# Patient Record
Sex: Female | Born: 1940
Health system: Southern US, Community
[De-identification: ages and names within clinical notes are randomized; demographics above are authoritative.]

## PROBLEM LIST (undated history)

## (undated) DIAGNOSIS — N2889 Other specified disorders of kidney and ureter: Secondary | ICD-10-CM

## (undated) DIAGNOSIS — I10 Essential (primary) hypertension: Secondary | ICD-10-CM

## (undated) DIAGNOSIS — E890 Postprocedural hypothyroidism: Secondary | ICD-10-CM

## (undated) DIAGNOSIS — M1990 Unspecified osteoarthritis, unspecified site: Secondary | ICD-10-CM

## (undated) DIAGNOSIS — E079 Disorder of thyroid, unspecified: Secondary | ICD-10-CM

## (undated) DIAGNOSIS — D249 Benign neoplasm of unspecified breast: Secondary | ICD-10-CM

## (undated) DIAGNOSIS — E785 Hyperlipidemia, unspecified: Secondary | ICD-10-CM

## (undated) DIAGNOSIS — N189 Chronic kidney disease, unspecified: Secondary | ICD-10-CM

## (undated) HISTORY — DX: Essential (primary) hypertension: I10

## (undated) HISTORY — DX: Benign neoplasm of unspecified breast: D24.9

## (undated) HISTORY — DX: Hyperlipidemia, unspecified: E78.5

## (undated) HISTORY — DX: Postprocedural hypothyroidism: E89.0

## (undated) HISTORY — PX: TOTAL THYROIDECTOMY: SHX2547

## (undated) HISTORY — DX: Other specified disorders of kidney and ureter: N28.89

## (undated) HISTORY — DX: Disorder of thyroid, unspecified: E07.9

## (undated) HISTORY — PX: APPENDECTOMY: SHX54

## (undated) HISTORY — PX: ABDOMINAL HYSTERECTOMY: SHX81

---

## 1967-04-20 HISTORY — PX: RADICAL HYSTERECTOMY: SHX2283

## 2011-05-04 LAB — HM COLONOSCOPY

## 2011-06-29 DIAGNOSIS — Z1212 Encounter for screening for malignant neoplasm of rectum: Secondary | ICD-10-CM | POA: Diagnosis not present

## 2011-06-29 DIAGNOSIS — E039 Hypothyroidism, unspecified: Secondary | ICD-10-CM | POA: Diagnosis not present

## 2011-06-29 DIAGNOSIS — I10 Essential (primary) hypertension: Secondary | ICD-10-CM | POA: Diagnosis not present

## 2011-06-29 DIAGNOSIS — Z1231 Encounter for screening mammogram for malignant neoplasm of breast: Secondary | ICD-10-CM | POA: Diagnosis not present

## 2011-08-24 DIAGNOSIS — Z6827 Body mass index (BMI) 27.0-27.9, adult: Secondary | ICD-10-CM | POA: Diagnosis not present

## 2011-08-24 DIAGNOSIS — I10 Essential (primary) hypertension: Secondary | ICD-10-CM | POA: Diagnosis not present

## 2011-08-24 DIAGNOSIS — E7800 Pure hypercholesterolemia, unspecified: Secondary | ICD-10-CM | POA: Diagnosis not present

## 2012-01-14 DIAGNOSIS — Z23 Encounter for immunization: Secondary | ICD-10-CM | POA: Diagnosis not present

## 2012-02-24 DIAGNOSIS — I10 Essential (primary) hypertension: Secondary | ICD-10-CM | POA: Diagnosis not present

## 2012-02-24 DIAGNOSIS — E559 Vitamin D deficiency, unspecified: Secondary | ICD-10-CM | POA: Diagnosis not present

## 2012-02-24 DIAGNOSIS — E7800 Pure hypercholesterolemia, unspecified: Secondary | ICD-10-CM | POA: Diagnosis not present

## 2012-04-19 HISTORY — PX: HEMORRHOID SURGERY: SHX153

## 2012-07-04 DIAGNOSIS — I10 Essential (primary) hypertension: Secondary | ICD-10-CM | POA: Diagnosis not present

## 2012-07-04 DIAGNOSIS — K649 Unspecified hemorrhoids: Secondary | ICD-10-CM | POA: Diagnosis not present

## 2012-07-04 DIAGNOSIS — E8940 Asymptomatic postprocedural ovarian failure: Secondary | ICD-10-CM | POA: Diagnosis not present

## 2012-07-04 DIAGNOSIS — E785 Hyperlipidemia, unspecified: Secondary | ICD-10-CM | POA: Diagnosis not present

## 2012-07-04 DIAGNOSIS — Z1231 Encounter for screening mammogram for malignant neoplasm of breast: Secondary | ICD-10-CM | POA: Diagnosis not present

## 2012-07-04 DIAGNOSIS — E039 Hypothyroidism, unspecified: Secondary | ICD-10-CM | POA: Diagnosis not present

## 2012-07-10 DIAGNOSIS — K645 Perianal venous thrombosis: Secondary | ICD-10-CM | POA: Diagnosis not present

## 2012-07-10 DIAGNOSIS — K648 Other hemorrhoids: Secondary | ICD-10-CM | POA: Diagnosis not present

## 2012-07-10 DIAGNOSIS — K6289 Other specified diseases of anus and rectum: Secondary | ICD-10-CM | POA: Diagnosis not present

## 2012-07-25 DIAGNOSIS — K644 Residual hemorrhoidal skin tags: Secondary | ICD-10-CM | POA: Diagnosis not present

## 2012-07-25 DIAGNOSIS — K648 Other hemorrhoids: Secondary | ICD-10-CM | POA: Diagnosis not present

## 2012-07-25 DIAGNOSIS — K645 Perianal venous thrombosis: Secondary | ICD-10-CM | POA: Diagnosis not present

## 2012-08-23 DIAGNOSIS — I10 Essential (primary) hypertension: Secondary | ICD-10-CM | POA: Diagnosis not present

## 2012-08-23 DIAGNOSIS — Z0000 Encounter for general adult medical examination without abnormal findings: Secondary | ICD-10-CM | POA: Diagnosis not present

## 2012-08-23 DIAGNOSIS — E559 Vitamin D deficiency, unspecified: Secondary | ICD-10-CM | POA: Diagnosis not present

## 2013-01-20 DIAGNOSIS — Z23 Encounter for immunization: Secondary | ICD-10-CM | POA: Diagnosis not present

## 2013-02-01 DIAGNOSIS — I10 Essential (primary) hypertension: Secondary | ICD-10-CM | POA: Diagnosis not present

## 2013-02-01 DIAGNOSIS — E039 Hypothyroidism, unspecified: Secondary | ICD-10-CM | POA: Diagnosis not present

## 2013-02-01 DIAGNOSIS — E559 Vitamin D deficiency, unspecified: Secondary | ICD-10-CM | POA: Diagnosis not present

## 2013-02-01 DIAGNOSIS — E785 Hyperlipidemia, unspecified: Secondary | ICD-10-CM | POA: Diagnosis not present

## 2013-08-15 DIAGNOSIS — I10 Essential (primary) hypertension: Secondary | ICD-10-CM | POA: Diagnosis not present

## 2013-08-15 DIAGNOSIS — R5383 Other fatigue: Secondary | ICD-10-CM | POA: Diagnosis not present

## 2013-08-15 DIAGNOSIS — E782 Mixed hyperlipidemia: Secondary | ICD-10-CM | POA: Diagnosis not present

## 2013-08-15 DIAGNOSIS — R5381 Other malaise: Secondary | ICD-10-CM | POA: Diagnosis not present

## 2013-08-15 DIAGNOSIS — E559 Vitamin D deficiency, unspecified: Secondary | ICD-10-CM | POA: Diagnosis not present

## 2013-08-15 DIAGNOSIS — R7309 Other abnormal glucose: Secondary | ICD-10-CM | POA: Diagnosis not present

## 2013-09-03 DIAGNOSIS — Z01419 Encounter for gynecological examination (general) (routine) without abnormal findings: Secondary | ICD-10-CM | POA: Diagnosis not present

## 2014-01-23 DIAGNOSIS — Z23 Encounter for immunization: Secondary | ICD-10-CM | POA: Diagnosis not present

## 2014-02-14 DIAGNOSIS — E039 Hypothyroidism, unspecified: Secondary | ICD-10-CM | POA: Diagnosis not present

## 2014-02-14 DIAGNOSIS — I10 Essential (primary) hypertension: Secondary | ICD-10-CM | POA: Diagnosis not present

## 2014-02-14 DIAGNOSIS — E785 Hyperlipidemia, unspecified: Secondary | ICD-10-CM | POA: Diagnosis not present

## 2014-02-20 DIAGNOSIS — E785 Hyperlipidemia, unspecified: Secondary | ICD-10-CM | POA: Diagnosis not present

## 2014-02-20 DIAGNOSIS — I10 Essential (primary) hypertension: Secondary | ICD-10-CM | POA: Diagnosis not present

## 2014-02-20 DIAGNOSIS — E039 Hypothyroidism, unspecified: Secondary | ICD-10-CM | POA: Diagnosis not present

## 2014-04-22 IMAGING — MG BILATERAL SCREENING
1 series · 4 of 4 positions shown · non-contrast
Comparison: None

CLINICAL DATA: Screening.

EXAM:
DIGITAL SCREENING BILATERAL MAMMOGRAM WITH CAD

[R CC · right · 4 of 4 slices shown]
[im 1/4]
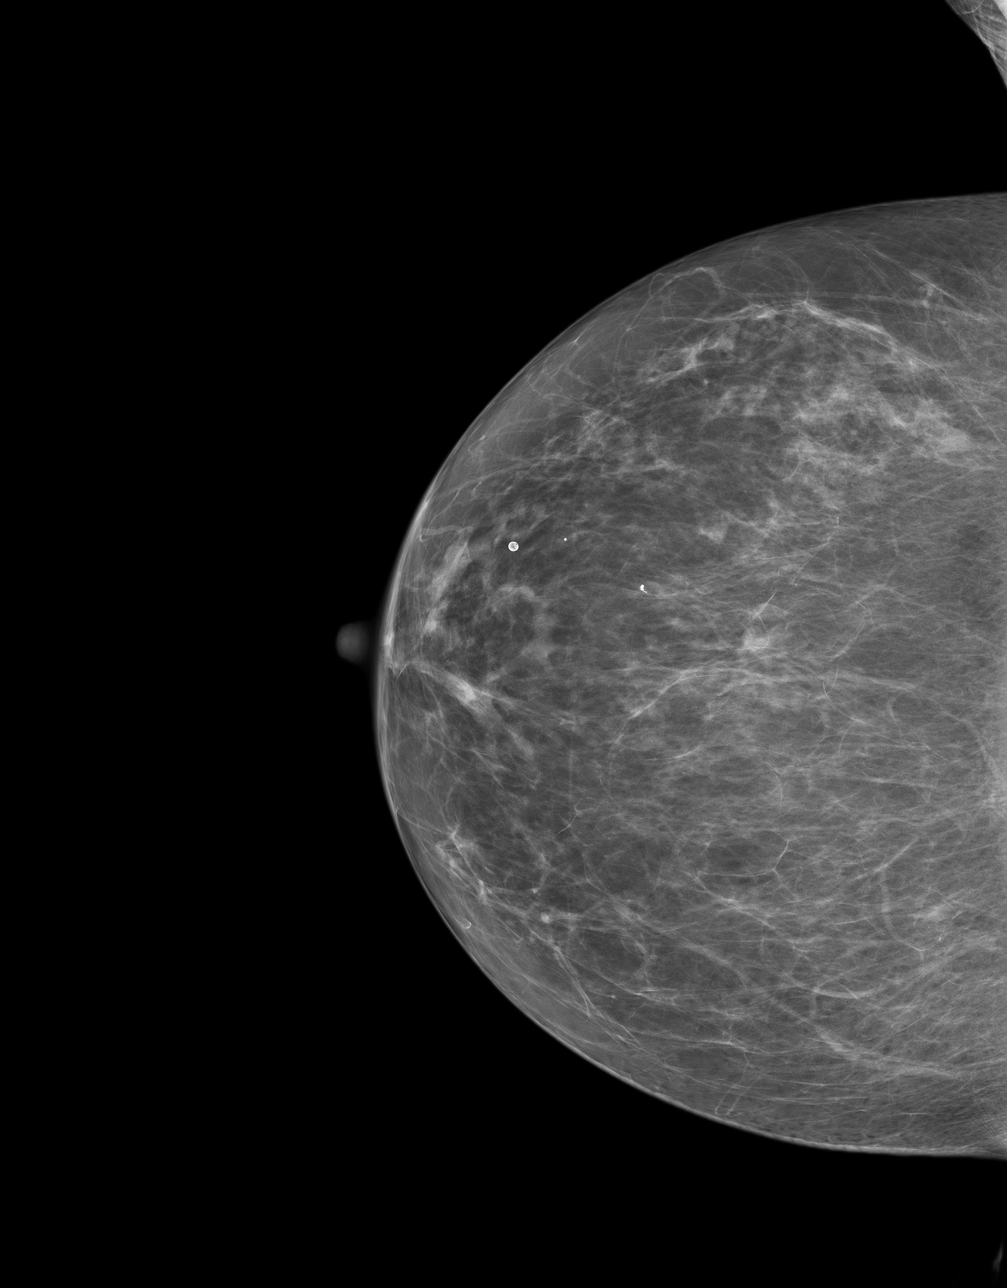
[im 2/4]
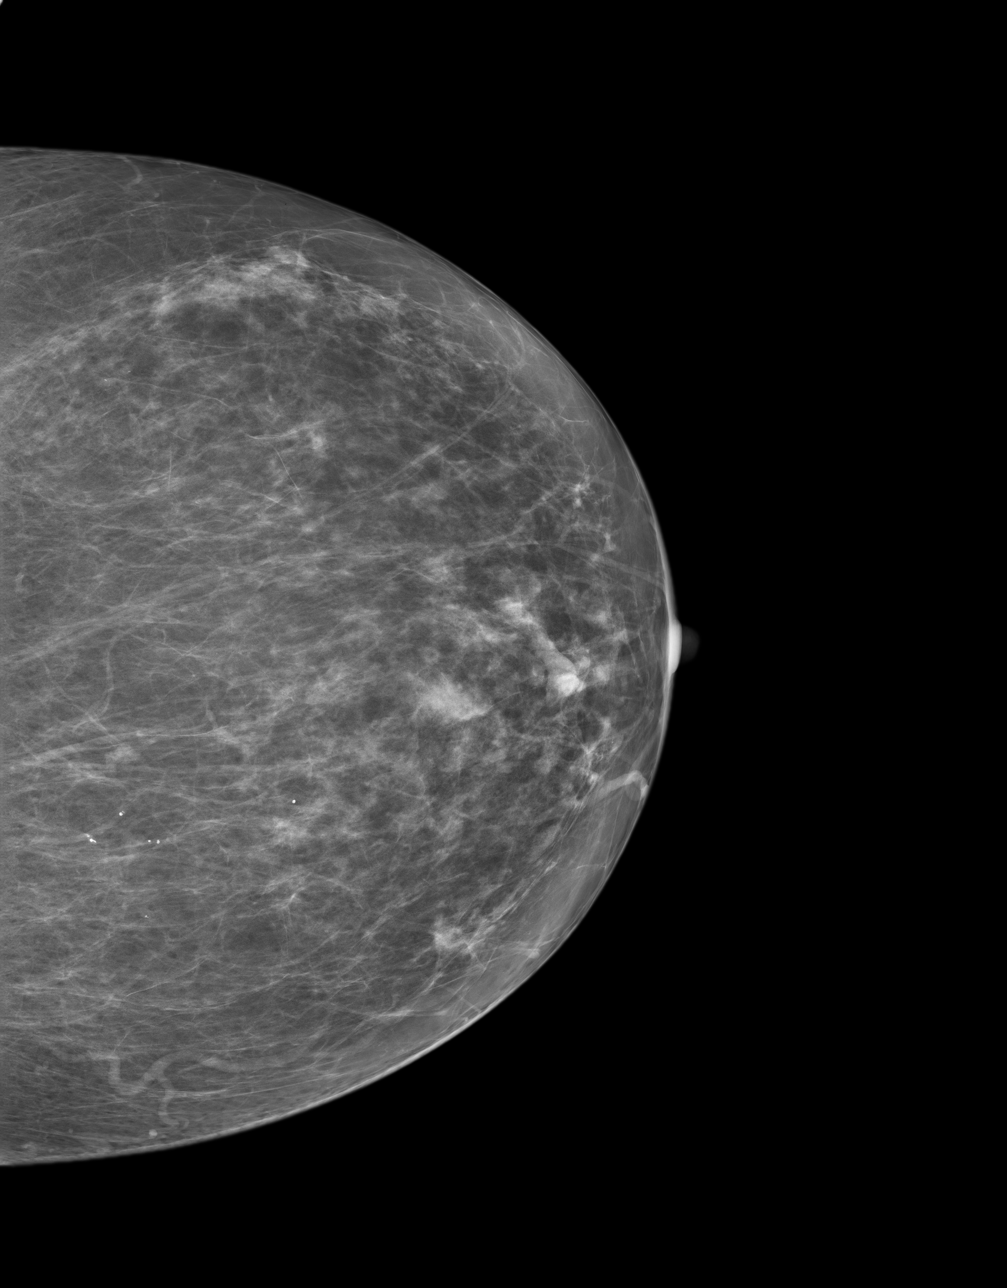
[im 3/4]
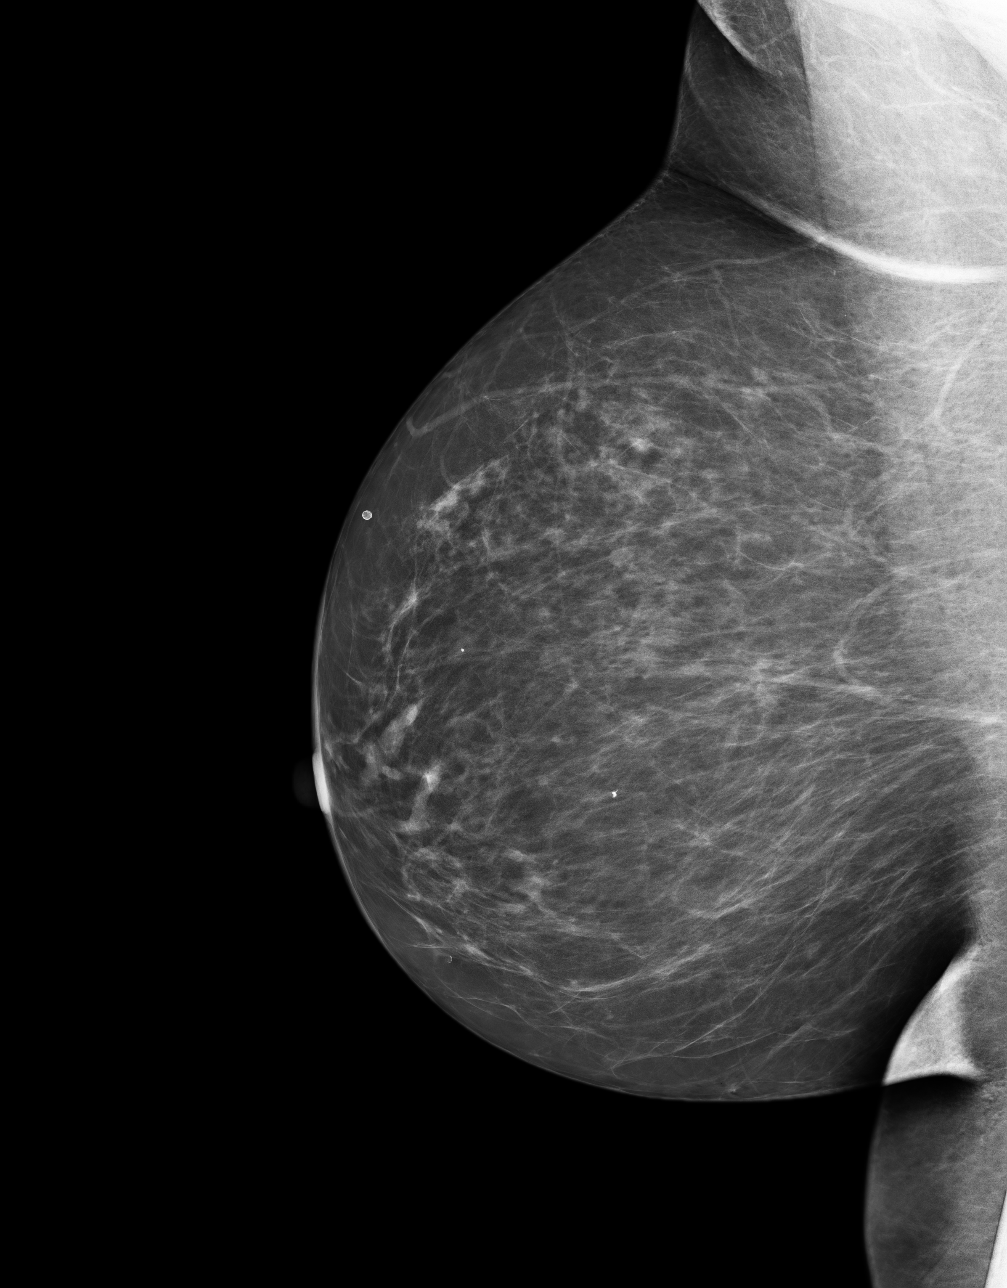
[im 4/4]
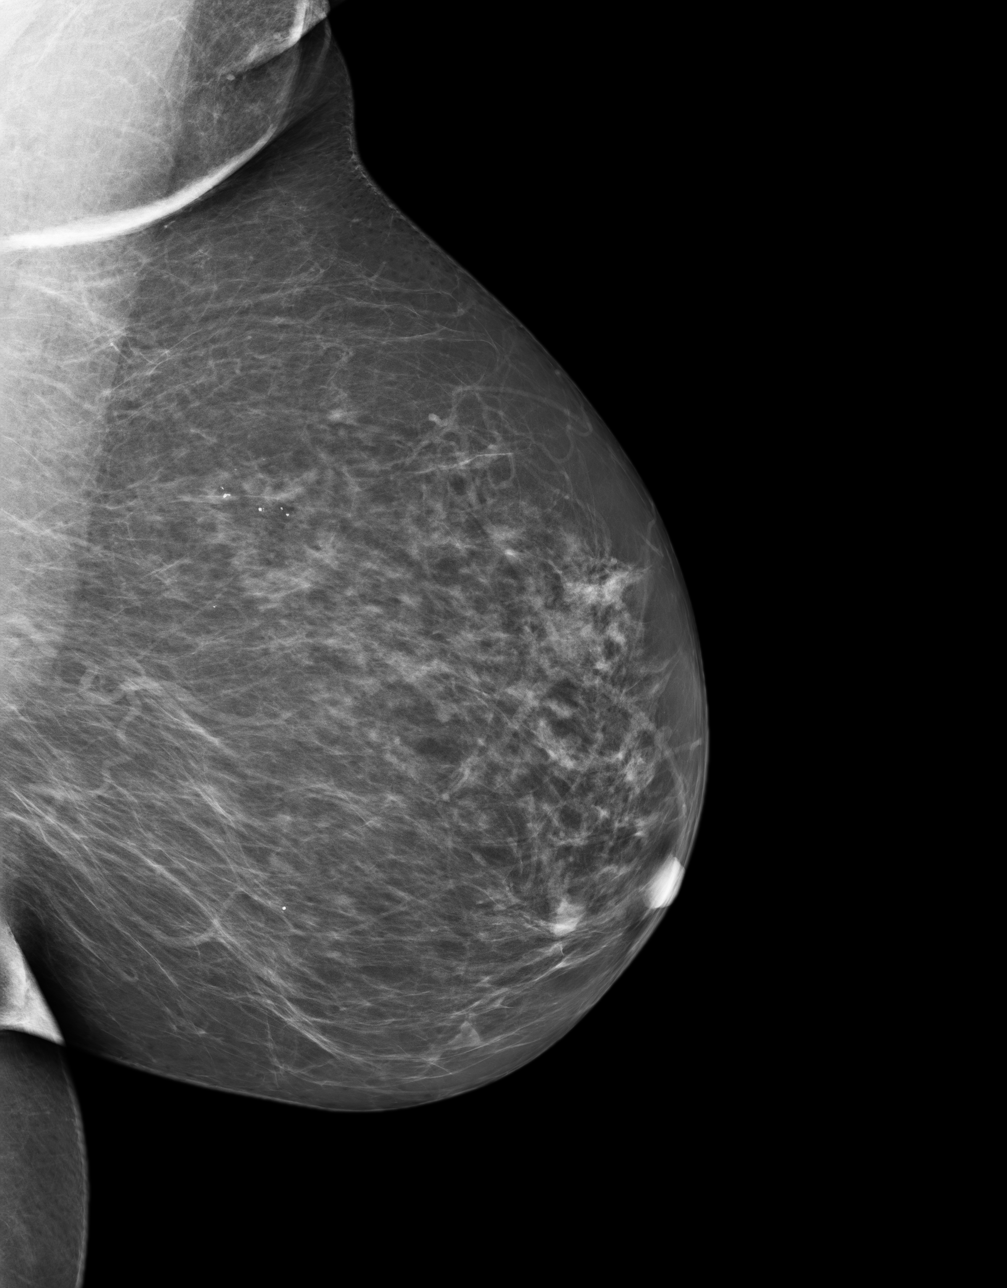

[4 of 4 positions shown; findings below may reference images not displayed]

ACR Breast Density Category b: There are scattered areas of
fibroglandular density.
FINDINGS: In the left breast, possible masses warrants further evaluation.
There also to adjacent clusters of calcifications in the upper outer
left breast for further evaluation. In the right breast, no findings
suspicious for malignancy.

Images were processed with CAD.
IMPRESSION: Further evaluation is suggested for possible masses and
calcifications in the left breast.

RECOMMENDATION:
Diagnostic mammogram and possibly ultrasound of the left breast.
(Code:3G-[id number])

The patient will be contacted regarding the findings, and additional
imaging will be scheduled.

BI-RADS CATEGORY  0: Incomplete. Need additional imaging evaluation
and/or prior mammograms for comparison.

## 2014-06-18 DIAGNOSIS — H40019 Open angle with borderline findings, low risk, unspecified eye: Secondary | ICD-10-CM | POA: Diagnosis not present

## 2014-09-10 DIAGNOSIS — E890 Postprocedural hypothyroidism: Secondary | ICD-10-CM | POA: Diagnosis not present

## 2014-09-10 DIAGNOSIS — Z23 Encounter for immunization: Secondary | ICD-10-CM | POA: Diagnosis not present

## 2014-09-10 DIAGNOSIS — E785 Hyperlipidemia, unspecified: Secondary | ICD-10-CM | POA: Diagnosis not present

## 2014-09-10 DIAGNOSIS — I10 Essential (primary) hypertension: Secondary | ICD-10-CM | POA: Diagnosis not present

## 2014-09-10 DIAGNOSIS — Z1239 Encounter for other screening for malignant neoplasm of breast: Secondary | ICD-10-CM | POA: Diagnosis not present

## 2014-09-11 ENCOUNTER — Other Ambulatory Visit: Payer: Self-pay | Admitting: Internal Medicine

## 2014-09-11 DIAGNOSIS — Z1231 Encounter for screening mammogram for malignant neoplasm of breast: Secondary | ICD-10-CM

## 2014-09-17 ENCOUNTER — Ambulatory Visit
Admission: RE | Admit: 2014-09-17 | Discharge: 2014-09-17 | Disposition: A | Discharge status: Home / Self Care | Payer: Medicare Other | Source: Ambulatory Visit | Attending: Internal Medicine | Admitting: Internal Medicine

## 2014-09-17 DIAGNOSIS — Z1231 Encounter for screening mammogram for malignant neoplasm of breast: Secondary | ICD-10-CM | POA: Diagnosis not present

## 2014-09-17 DIAGNOSIS — R921 Mammographic calcification found on diagnostic imaging of breast: Secondary | ICD-10-CM | POA: Insufficient documentation

## 2014-09-17 DIAGNOSIS — R922 Inconclusive mammogram: Secondary | ICD-10-CM | POA: Insufficient documentation

## 2014-09-18 ENCOUNTER — Other Ambulatory Visit: Payer: Self-pay | Admitting: Internal Medicine

## 2014-09-18 DIAGNOSIS — R928 Other abnormal and inconclusive findings on diagnostic imaging of breast: Secondary | ICD-10-CM

## 2014-09-23 ENCOUNTER — Ambulatory Visit
Admission: RE | Admit: 2014-09-23 | Discharge: 2014-09-23 | Disposition: A | Discharge status: Home / Self Care | Payer: Medicare Other | Source: Ambulatory Visit | Attending: Internal Medicine | Admitting: Internal Medicine

## 2014-09-23 DIAGNOSIS — R928 Other abnormal and inconclusive findings on diagnostic imaging of breast: Secondary | ICD-10-CM

## 2014-09-23 DIAGNOSIS — N63 Unspecified lump in breast: Secondary | ICD-10-CM | POA: Diagnosis not present

## 2014-09-24 ENCOUNTER — Other Ambulatory Visit: Payer: Self-pay | Admitting: Internal Medicine

## 2014-09-24 DIAGNOSIS — N63 Unspecified lump in unspecified breast: Secondary | ICD-10-CM

## 2014-09-24 DIAGNOSIS — R928 Other abnormal and inconclusive findings on diagnostic imaging of breast: Secondary | ICD-10-CM

## 2014-10-15 ENCOUNTER — Ambulatory Visit
Admission: RE | Admit: 2014-10-15 | Discharge: 2014-10-15 | Disposition: A | Discharge status: Home / Self Care | Payer: Medicare Other | Source: Ambulatory Visit | Attending: Internal Medicine | Admitting: Internal Medicine

## 2014-10-15 ENCOUNTER — Other Ambulatory Visit: Payer: Self-pay | Admitting: Internal Medicine

## 2014-10-15 DIAGNOSIS — R928 Other abnormal and inconclusive findings on diagnostic imaging of breast: Secondary | ICD-10-CM

## 2014-10-15 DIAGNOSIS — R921 Mammographic calcification found on diagnostic imaging of breast: Secondary | ICD-10-CM | POA: Insufficient documentation

## 2014-10-15 DIAGNOSIS — N63 Unspecified lump in unspecified breast: Secondary | ICD-10-CM

## 2014-10-15 DIAGNOSIS — D242 Benign neoplasm of left breast: Secondary | ICD-10-CM | POA: Diagnosis not present

## 2014-10-15 HISTORY — PX: BREAST BIOPSY: SHX20

## 2014-10-17 LAB — SURGICAL PATHOLOGY

## 2014-10-18 ENCOUNTER — Other Ambulatory Visit: Payer: Self-pay | Admitting: Internal Medicine

## 2014-10-18 ENCOUNTER — Telehealth: Payer: Self-pay | Admitting: Internal Medicine

## 2014-10-18 ENCOUNTER — Encounter: Payer: Self-pay | Admitting: Internal Medicine

## 2014-10-18 DIAGNOSIS — E785 Hyperlipidemia, unspecified: Secondary | ICD-10-CM | POA: Insufficient documentation

## 2014-10-18 DIAGNOSIS — E890 Postprocedural hypothyroidism: Secondary | ICD-10-CM | POA: Insufficient documentation

## 2014-10-18 DIAGNOSIS — I10 Essential (primary) hypertension: Secondary | ICD-10-CM

## 2014-10-18 DIAGNOSIS — D242 Benign neoplasm of left breast: Secondary | ICD-10-CM | POA: Insufficient documentation

## 2014-10-18 HISTORY — DX: Hyperlipidemia, unspecified: E78.5

## 2014-10-18 HISTORY — DX: Postprocedural hypothyroidism: E89.0

## 2014-10-18 HISTORY — DX: Essential (primary) hypertension: I10

## 2014-10-18 NOTE — Telephone Encounter (Signed)
I spoke with patient.  She had already received the report from the biopsy and was expecting my call.  The plan is to refer her to [facility] for consultation and she agrees.

## 2014-10-28 ENCOUNTER — Encounter: Payer: Self-pay | Admitting: General Surgery

## 2014-10-28 ENCOUNTER — Ambulatory Visit (INDEPENDENT_AMBULATORY_CARE_PROVIDER_SITE_OTHER): Payer: Medicare Other | Admitting: General Surgery

## 2014-10-28 ENCOUNTER — Ambulatory Visit: Payer: Self-pay | Admitting: General Surgery

## 2014-10-28 VITALS — BP 130/70 | HR 68 | Resp 14 | Ht 64.0 in | Wt 151.0 lb

## 2014-10-28 DIAGNOSIS — D242 Benign neoplasm of left breast: Secondary | ICD-10-CM | POA: Diagnosis not present

## 2014-10-28 DIAGNOSIS — D249 Benign neoplasm of unspecified breast: Secondary | ICD-10-CM

## 2014-10-28 HISTORY — DX: Benign neoplasm of unspecified breast: D24.9

## 2014-10-28 NOTE — Progress Notes (Signed)
Patient ID: Rachel Lopez, female   DOB: [birth year], 74 y.o.   MRN: [id number]  Chief Complaint  Patient presents with  . Other    breast problems    HPI Rachel Lopez is a 74 y.o. female who presents for a breast evaluation. The most recent mammogram was done on 09/25/14 and left breast biopsy done on 10/15/14 at [facility]. Patient does perform regular self breast checks and gets regular mammograms done. She did have prior mammograms done in [place] in 2014. That was her most recent study prior to moving to [place]  2 years ago.   She did not have any breast problems prior to getting her mammogram done. Her husband passed away 3 years ago from colon cancer. She has one son whom she moved to be nearer to.  The patient was born in [place], moving to the [place] in 1969 with her husband.  HPI  Past Medical History  Diagnosis Date  . Thyroid disease   . Hypertension   . Hyperlipidemia     Past Surgical History  Procedure Laterality Date  . Breast biopsy Left 10/15/2014    papilloma  . Radical hysterectomy  1969  . Total thyroidectomy      age 42  . Appendectomy      36yr  . Hemorrhoid surgery  2014    No family history on file.  Social History History  Substance Use Topics  . Smoking status: Former Research scientist (life sciences)  . Smokeless tobacco: Never Used  . Alcohol Use: No    No Known Allergies  Current Outpatient Prescriptions  Medication Sig Dispense Refill  . atorvastatin (LIPITOR) 20 MG tablet Take 1 tablet by mouth at bedtime.    Marland Kitchen levothyroxine (SYNTHROID, LEVOTHROID) 88 MCG tablet Take 1 tablet by mouth daily.    Marland Kitchen lisinopril (PRINIVIL,ZESTRIL) 40 MG tablet Take 1 tablet by mouth daily.     No current facility-administered medications for this visit.    Review of Systems Review of Systems  Constitutional: Negative.   Respiratory: Negative.   Cardiovascular: Negative.     Blood pressure 130/70, pulse 68, resp. rate 14, height 5\' 4"  (1.626 m), weight 151  lb (68.493 kg).  Physical Exam Physical Exam  Constitutional: She is oriented to person, place, and time. She appears well-developed and well-nourished.  HENT:  Mouth/Throat: Oropharynx is clear and moist.  Eyes: Conjunctivae are normal. No scleral icterus.  Neck: No thyromegaly present.  Well healed thyroidectomy scar   Cardiovascular: Normal rate, regular rhythm and normal heart sounds.   Pulmonary/Chest: Effort normal and breath sounds normal. Right breast exhibits no inverted nipple, no mass, no nipple discharge, no skin change and no tenderness. Left breast exhibits no inverted nipple, no mass, no nipple discharge, no skin change and no tenderness.    1 cm thickening left breast 9 o'clock adjacent to the nipple  Lymphadenopathy:    She has no cervical adenopathy.    She has no axillary adenopathy.  Neurological: She is alert and oriented to person, place, and time.  Skin: Skin is warm and dry.    Data Reviewed  PCP notes dated 09/10/2014 were reviewed.   2014 mammograms from [place] were reviewed as well as her more recent studies completed at the [facility].  Screening mammograms completed based bar 09/17/2014 raised questions regarding densities in the retroareolar area as well as an area of calcification in the upper-outer quadrant of the left breast. BI-RADS-0.  Additional views were completed 09/23/2014  showed on ultrasound a 0.42 cm intraductal mass in the left breast at 4:00 position 1 cm from the nipple. I read-4. Calcifications were minimally changed from prior films completed in [place]. A 6 month follow-up was recommended in this regard.  Core biopsy completed 10/15/2014 was completed using a 14-gauge spring-loaded device.  Pathology showed a 0.4 cm intraductal papilloma without evidence of atypia.  Assessment    Intraductal papilloma without evidence of atypia, clinically asymptomatic.  Minimal change and upper outer quadrant microcalcifications on my  review.      Plan    With the pathology benign and the patient asymptomatic, observation is an acceptable alternative. No indication for mandatory ductal excision.  The calcifications are unremarkable on my review, and a year follow-up with screening mammograms would be appropriate.  The patient will return in November 2016 for an office ultrasound to reassess the area of the papilloma. Further recommendations made at that time.     Plan to return in November with office ultrasound.   PCP:  Galen Daft 10/28/2014, 12:27 PM

## 2014-11-04 ENCOUNTER — Telehealth: Payer: Self-pay

## 2014-11-04 MED ORDER — LEVOTHYROXINE SODIUM 88 MCG PO TABS
88.0000 ug | ORAL_TABLET | Freq: Every day | ORAL | Status: DC
Start: 2014-11-04 — End: 2015-11-04

## 2014-11-04 NOTE — Telephone Encounter (Signed)
Patient needs Levothyroxine 64mcg 1 qd [employer] [address], do 90 days please.dr

## 2014-12-12 ENCOUNTER — Encounter: Payer: Self-pay | Admitting: Internal Medicine

## 2014-12-30 ENCOUNTER — Encounter: Payer: Self-pay | Admitting: Internal Medicine

## 2014-12-30 ENCOUNTER — Ambulatory Visit (INDEPENDENT_AMBULATORY_CARE_PROVIDER_SITE_OTHER): Payer: Medicare Other | Admitting: Internal Medicine

## 2014-12-30 VITALS — BP 122/74 | HR 96 | Ht 64.0 in | Wt 152.6 lb

## 2014-12-30 DIAGNOSIS — R1011 Right upper quadrant pain: Secondary | ICD-10-CM | POA: Diagnosis not present

## 2014-12-30 LAB — POCT URINALYSIS DIPSTICK
BILIRUBIN UA: NEGATIVE
Blood, UA: NEGATIVE
Glucose, UA: NEGATIVE
KETONES UA: NEGATIVE
Leukocytes, UA: NEGATIVE
Nitrite, UA: NEGATIVE
PH UA: 5
PROTEIN UA: NEGATIVE
SPEC GRAV UA: 1.01
Urobilinogen, UA: 0.2

## 2014-12-30 NOTE — Progress Notes (Signed)
Date:  12/30/2014   Name:  Rachel Lopez   DOB:  17-Apr-1941   MRN:  [id number]   Chief Complaint: Abdominal Pain Abdominal Pain This is a new problem. The current episode started in the past 7 days. The onset quality is sudden. The problem occurs constantly. The problem has been unchanged. The pain is located in the RUQ. The pain is moderate. The quality of the pain is aching and dull. Associated symptoms include frequency. Pertinent negatives include no constipation, diarrhea, dysuria, fever or headaches. The pain is aggravated by being still. The pain is relieved by activity.   she denies any change in the pain with food or fasting. There is no change with a bowel movement. She denies vomiting nausea and diarrhea or blood in the stool. She has had some increased urinary frequency without dysuria. She's had no injury or falls. She has no unusual activity such as lifting or twisting.   Review of Systems:  Review of Systems  Constitutional: Negative for fever, diaphoresis and fatigue.  Cardiovascular: Negative for chest pain and palpitations.  Gastrointestinal: Positive for abdominal pain. Negative for diarrhea, constipation, blood in stool and anal bleeding.  Genitourinary: Positive for frequency. Negative for dysuria, urgency and pelvic pain.  Musculoskeletal: Negative for back pain.  Neurological: Negative for headaches.    Patient Active Problem List   Diagnosis Date Noted  . Papilloma of breast 10/28/2014  . HLD (hyperlipidemia) 10/18/2014  . Benign hypertension 10/18/2014  . Hypothyroidism, postablative 10/18/2014  . Intraductal papilloma of left breast 10/18/2014    Prior to Admission medications   Medication Sig Start Date End Date Taking? Authorizing Provider  atorvastatin (LIPITOR) 20 MG tablet Take 1 tablet by mouth at bedtime.   Yes Historical Provider, MD  levothyroxine (SYNTHROID, LEVOTHROID) 88 MCG tablet Take 1 tablet (88 mcg total) by mouth daily. 11/04/14  Yes Glean Hess, MD  lisinopril (PRINIVIL,ZESTRIL) 40 MG tablet Take 1 tablet by mouth daily.   Yes Historical Provider, MD    No Known Allergies  Past Surgical History  Procedure Laterality Date  . Breast biopsy Left 10/15/2014    papilloma  . Radical hysterectomy  1969  . Total thyroidectomy      age 52  . Appendectomy      14yr  . Hemorrhoid surgery  2014    Social History  Substance Use Topics  . Smoking status: Former Research scientist (life sciences)  . Smokeless tobacco: Never Used  . Alcohol Use: No     Medication list has been reviewed and updated.  Physical Examination:  Physical Exam  Constitutional: She appears well-developed and well-nourished.  Neck: Normal range of motion. Neck supple. No tracheal tenderness present.  Cardiovascular: Normal rate, regular rhythm and normal heart sounds.   Pulmonary/Chest: Breath sounds normal.  Abdominal: Soft. Normal appearance. Bowel sounds are decreased. There is no hepatosplenomegaly. There is tenderness in the right upper quadrant and right lower quadrant. There is CVA tenderness. There is no rigidity, no rebound, no guarding and negative Murphy's sign. No hernia.  Psychiatric: She has a normal mood and affect.  Nursing note and vitals reviewed.   BP 122/74 mmHg  Pulse 96  Ht 5\' 4"  (1.626 m)  Wt 152 lb 9.6 oz (69.219 kg)  BMI 26.18 kg/m2  Assessment and Plan: 1. Right upper quadrant pain  urinalysis is negative  We'll obtain complete abdominal ultrasound and CBC with metabolic panel  Patient is urged to go to the emergency room if symptoms significantly worsen  or vomiting fever chills or diarrhea develop - POCT urinalysis dipstick - CBC with Differential/Platelet - US Abdomen Complete; Future - Comprehensive metabolic panel   Halina Maidens, MD [facility]  12/30/2014

## 2014-12-31 ENCOUNTER — Ambulatory Visit
Admission: RE | Admit: 2014-12-31 | Discharge: 2014-12-31 | Disposition: A | Discharge status: Home / Self Care | Payer: Medicare Other | Source: Ambulatory Visit | Attending: Internal Medicine | Admitting: Internal Medicine

## 2014-12-31 ENCOUNTER — Other Ambulatory Visit: Payer: Self-pay | Admitting: Internal Medicine

## 2014-12-31 DIAGNOSIS — N2889 Other specified disorders of kidney and ureter: Secondary | ICD-10-CM

## 2014-12-31 DIAGNOSIS — R1011 Right upper quadrant pain: Secondary | ICD-10-CM | POA: Diagnosis not present

## 2014-12-31 DIAGNOSIS — N281 Cyst of kidney, acquired: Secondary | ICD-10-CM | POA: Insufficient documentation

## 2014-12-31 LAB — CBC WITH DIFFERENTIAL/PLATELET
Basophils Absolute: 0 10*3/uL (ref 0.0–0.2)
Basos: 1 %
EOS (ABSOLUTE): 0.4 10*3/uL (ref 0.0–0.4)
Eos: 4 %
Hematocrit: 34.8 % (ref 34.0–46.6)
Hemoglobin: 11.5 g/dL (ref 11.1–15.9)
Immature Grans (Abs): 0 10*3/uL (ref 0.0–0.1)
Immature Granulocytes: 0 %
Lymphocytes Absolute: 1.1 10*3/uL (ref 0.7–3.1)
Lymphs: 12 %
MCH: 29.3 pg (ref 26.6–33.0)
MCHC: 33 g/dL (ref 31.5–35.7)
MCV: 89 fL (ref 79–97)
Monocytes Absolute: 1.1 10*3/uL — ABNORMAL HIGH (ref 0.1–0.9)
Monocytes: 12 %
Neutrophils Absolute: 6.2 10*3/uL (ref 1.4–7.0)
Neutrophils: 71 %
Platelets: 322 10*3/uL (ref 150–379)
RBC: 3.93 x10E6/uL (ref 3.77–5.28)
RDW: 12.7 % (ref 12.3–15.4)
WBC: 8.7 10*3/uL (ref 3.4–10.8)

## 2014-12-31 LAB — COMPREHENSIVE METABOLIC PANEL
ALT: 8 IU/L (ref 0–32)
AST: 17 IU/L (ref 0–40)
Albumin/Globulin Ratio: 1.7 (ref 1.1–2.5)
Albumin: 4.1 g/dL (ref 3.5–4.8)
Alkaline Phosphatase: 91 IU/L (ref 39–117)
BUN/Creatinine Ratio: 15 (ref 11–26)
BUN: 13 mg/dL (ref 8–27)
Bilirubin Total: 0.7 mg/dL (ref 0.0–1.2)
CO2: 23 mmol/L (ref 18–29)
CREATININE: 0.88 mg/dL (ref 0.57–1.00)
Calcium: 8.6 mg/dL — ABNORMAL LOW (ref 8.7–10.3)
Chloride: 101 mmol/L (ref 97–108)
GFR, EST AFRICAN AMERICAN: 76 mL/min/{1.73_m2} (ref >59)
GFR, EST NON AFRICAN AMERICAN: 66 mL/min/{1.73_m2} (ref >59)
GLOBULIN, TOTAL: 2.4 g/dL (ref 1.5–4.5)
Glucose: 92 mg/dL (ref 65–99)
Potassium: 5.1 mmol/L (ref 3.5–5.2)
SODIUM: 140 mmol/L (ref 134–144)
TOTAL PROTEIN: 6.5 g/dL (ref 6.0–8.5)

## 2015-01-01 ENCOUNTER — Other Ambulatory Visit: Payer: Self-pay | Admitting: Internal Medicine

## 2015-01-01 DIAGNOSIS — D1771 Benign lipomatous neoplasm of kidney: Secondary | ICD-10-CM | POA: Insufficient documentation

## 2015-01-01 DIAGNOSIS — N2889 Other specified disorders of kidney and ureter: Secondary | ICD-10-CM

## 2015-01-01 HISTORY — DX: Other specified disorders of kidney and ureter: N28.89

## 2015-01-02 ENCOUNTER — Ambulatory Visit (INDEPENDENT_AMBULATORY_CARE_PROVIDER_SITE_OTHER): Payer: Medicare Other | Admitting: Urology

## 2015-01-02 ENCOUNTER — Encounter: Payer: Self-pay | Admitting: Urology

## 2015-01-02 VITALS — BP 99/63 | HR 85 | Ht 66.0 in | Wt 152.8 lb

## 2015-01-02 DIAGNOSIS — N2889 Other specified disorders of kidney and ureter: Secondary | ICD-10-CM

## 2015-01-02 NOTE — Progress Notes (Signed)
01/02/2015 11:24 AM   Karie Fetch Coluccio 01/29/1941 [id number]  Referring provider: Glean Hess, MD [address] [place], [place] [address]  Chief Complaint  Patient presents with  . RENAL MASS    HPI: The patient is a 74 year old female who presents for a 9 cm right renal mass. This mass was found on ultrasound after the patient experienced right flank pain.  The patient has no other urological issues. She denies any frequency, urgency, incontinence, and hematuria. She is never seen urologist before. She scheduled to have MRI on September 20.   PMH: Past Medical History  Diagnosis Date  . Thyroid disease   . Hypertension   . Hyperlipidemia   . Benign hypertension 10/18/2014  . Hypothyroidism, postablative 10/18/2014  . HLD (hyperlipidemia) 10/18/2014  . Papilloma of breast 10/28/2014  . Renal mass, right 01/01/2015    Surgical History: Past Surgical History  Procedure Laterality Date  . Breast biopsy Left 10/15/2014    papilloma  . Radical hysterectomy  1969  . Total thyroidectomy      age 41  . Appendectomy      54yr  . Hemorrhoid surgery  2014    Home Medications:    Medication List       This list is accurate as of: 01/02/15 11:24 AM.  Always use your most recent med list.               atorvastatin 20 MG tablet  Commonly known as:  LIPITOR  Take 1 tablet by mouth at bedtime.     levothyroxine 88 MCG tablet  Commonly known as:  SYNTHROID, LEVOTHROID  Take 1 tablet (88 mcg total) by mouth daily.     lisinopril 40 MG tablet  Commonly known as:  PRINIVIL,ZESTRIL  Take 1 tablet by mouth daily.        Allergies: No Known Allergies  Family History: Family History  Problem Relation Age of Onset  . Prostate cancer Neg Hx   . Breast cancer Neg Hx   . Bladder Cancer Neg Hx   . Kidney cancer Neg Hx     Social History:  reports that she has quit smoking. She has never used smokeless tobacco. She reports that she does not drink alcohol or  use illicit drugs.  ROS: UROLOGY Frequent Urination?: No Hard to postpone urination?: No Burning/pain with urination?: No Get up at night to urinate?: No Leakage of urine?: No Urine stream starts and stops?: No Trouble starting stream?: No Do you have to strain to urinate?: No Blood in urine?: No Urinary tract infection?: No Sexually transmitted disease?: No Injury to kidneys or bladder?: No Painful intercourse?: No Weak stream?: No Currently pregnant?: No Vaginal bleeding?: No Last menstrual period?: nO  Gastrointestinal Nausea?: No Vomiting?: No Indigestion/heartburn?: No Diarrhea?: No Constipation?: No  Constitutional Fever: No Night sweats?: No Fatigue?: No  Skin Skin rash/lesions?: No Itching?: No  Eyes Blurred vision?: No Double vision?: No  Ears/Nose/Throat Sore throat?: No Sinus problems?: No  Hematologic/Lymphatic Swollen glands?: No Easy bruising?: No  Cardiovascular Leg swelling?: No Chest pain?: No  Respiratory Cough?: No Shortness of breath?: No  Endocrine Excessive thirst?: No  Musculoskeletal Back pain?: No Joint pain?: Yes  Neurological Headaches?: No Dizziness?: No  Psychologic Depression?: No Anxiety?: No  Physical Exam: BP 99/63 mmHg  Pulse 85  Ht 5\' 6"  (1.676 m)  Wt 152 lb 12.8 oz (69.31 kg)  BMI 24.67 kg/m2  Constitutional:  Alert and oriented, No acute  distress. HEENT: [place] AT, moist mucus membranes.  Trachea midline, no masses. Cardiovascular: No clubbing, cyanosis, or edema. Respiratory: Normal respiratory effort, no increased work of breathing. GI: Abdomen is soft, nontender, nondistended, no abdominal masses GU: No CVA tenderness.  Skin: No rashes, bruises or suspicious lesions. Lymph: No cervical or inguinal adenopathy. Neurologic: Grossly intact, no focal deficits, moving all 4 extremities. Psychiatric: Normal mood and affect.  Laboratory Data: Lab Results  Component Value Date   WBC 8.7 12/30/2014     HCT 34.8 12/30/2014    Lab Results  Component Value Date   CREATININE 0.88 12/30/2014    No results found for: PSA  No results found for: TESTOSTERONE  No results found for: HGBA1C  Urinalysis    Component Value Date/Time   BILIRUBINUR neg 12/30/2014 1649   PROTEINUR neg 12/30/2014 1649   UROBILINOGEN 0.2 12/30/2014 1649   NITRITE neg 12/30/2014 1649   LEUKOCYTESUR Negative 12/30/2014 1649    Pertinent Imaging: CLINICAL DATA: Right upper quadrant pain.  EXAM: ULTRASOUND ABDOMEN COMPLETE  COMPARISON: None.  FINDINGS: Gallbladder: No gallstones or wall thickening visualized. No sonographic Murphy sign noted.  Common bile duct: Diameter: 2.5 mm  Liver: Small echogenic foci noted suggesting granulomas. No focal hepatic abnormality identified. Liver echogenicity otherwise normal.  IVC: No abnormality visualized.  Pancreas: Visualized portion unremarkable.  Spleen: Size and appearance within normal limits.  Right Kidney: Length: 11.3 cm. A large 9.3 x 5.2 x 7.6 cm hyperechoic mass is present in the right kidney. Differential diagnosis includes a large angiomyolipoma. A renal cell carcinoma cannot be excluded. Gadolinium-enhanced MRI of the kidneys suggested for further evaluation. Renal cortex echotexture normal. No hydronephrosis.  Left Kidney: Length: 9.7 cm. Echogenicity within normal limits. No hydronephrosis visualized. New 1.5 cm simple cyst left upper renal pole. 1.6 cm simple cyst midportion left kidney.  Abdominal aorta: No aneurysm visualized.  Other findings: None.  IMPRESSION: 1. Large 9.3 x 5.2 x 7.6 cm hyperechoic mass right kidney. Although this could represent a large angiomyolipoma a renal cell carcinoma cannot be excluded. Gadolinium-enhanced MRI of the kidneys suggested for further evaluation. 2. Simple cysts left kidney.   Assessment & Plan:    1.  Right Renal Mass - 9.3 cm I discussed with the patient that her  renal mass on ultrasound likely is either renal cell carcinoma or an angiomyolipoma. Though angiomyolipomas are benign, surgical removal is indicated for any angiomyolipoma over 5 cm due to the risk of spontaneous bleed. Therefore regardless of the tumor type, the patient is best suited with a radical right nephrectomy. We will still, however, wait for the MRI to be done to better visualize her anatomy before her operation. I discussed this in detail with the patient, and she is agreeable.   Follow up: One week after MRI to discuss surgical planning  Nickie Retort, [place] [address] [place], [place] [address] [phone]

## 2015-01-07 ENCOUNTER — Ambulatory Visit
Admission: RE | Admit: 2015-01-07 | Discharge: 2015-01-07 | Disposition: A | Discharge status: Home / Self Care | Payer: Medicare Other | Source: Ambulatory Visit | Attending: Internal Medicine | Admitting: Internal Medicine

## 2015-01-07 DIAGNOSIS — D1771 Benign lipomatous neoplasm of kidney: Secondary | ICD-10-CM | POA: Insufficient documentation

## 2015-01-07 DIAGNOSIS — N2889 Other specified disorders of kidney and ureter: Secondary | ICD-10-CM | POA: Diagnosis not present

## 2015-01-07 DIAGNOSIS — N281 Cyst of kidney, acquired: Secondary | ICD-10-CM | POA: Diagnosis not present

## 2015-01-07 MED ORDER — GADOBENATE DIMEGLUMINE 529 MG/ML IV SOLN
15.0000 mL | Freq: Once | INTRAVENOUS | Status: AC | PRN
  Administered 2015-01-07: 14 mL via INTRAVENOUS

## 2015-01-08 ENCOUNTER — Encounter: Payer: Self-pay | Admitting: Urology

## 2015-01-08 ENCOUNTER — Ambulatory Visit (INDEPENDENT_AMBULATORY_CARE_PROVIDER_SITE_OTHER): Payer: Medicare Other | Admitting: Urology

## 2015-01-08 VITALS — BP 119/69 | HR 91 | Ht 66.0 in | Wt 151.6 lb

## 2015-01-08 DIAGNOSIS — D3001 Benign neoplasm of right kidney: Secondary | ICD-10-CM | POA: Diagnosis not present

## 2015-01-08 NOTE — Progress Notes (Signed)
01/08/2015 10:47 AM   Rachel Lopez [birth year] [id number]  Referring Marissa Lowrey: Glean Hess, MD [address] [place], [place] [address]  Chief Complaint  Patient presents with  . Follow-up    Renal mass    HPI: Patient is a 74 year old female presents today to discuss her recent MRI imaging. She has a 7.4 cm right angiomyolipoma on MRI renal protocol. She presents today to discuss definitive management.   PMH: Past Medical History  Diagnosis Date  . Thyroid disease   . Hypertension   . Hyperlipidemia   . Benign hypertension 10/18/2014  . Hypothyroidism, postablative 10/18/2014  . HLD (hyperlipidemia) 10/18/2014  . Papilloma of breast 10/28/2014  . Renal mass, right 01/01/2015    Surgical History: Past Surgical History  Procedure Laterality Date  . Breast biopsy Left 10/15/2014    papilloma  . Radical hysterectomy  1969  . Total thyroidectomy      age 59  . Appendectomy      70yr  . Hemorrhoid surgery  2014    Home Medications:    Medication List       This list is accurate as of: 01/08/15 10:47 AM.  Always use your most recent med list.               atorvastatin 20 MG tablet  Commonly known as:  LIPITOR  Take 1 tablet by mouth at bedtime.     levothyroxine 88 MCG tablet  Commonly known as:  SYNTHROID, LEVOTHROID  Take 1 tablet (88 mcg total) by mouth daily.     lisinopril 40 MG tablet  Commonly known as:  PRINIVIL,ZESTRIL  Take 1 tablet by mouth daily.        Allergies: No Known Allergies  Family History: Family History  Problem Relation Age of Onset  . Prostate cancer Neg Hx   . Breast cancer Neg Hx   . Bladder Cancer Neg Hx   . Kidney cancer Neg Hx     Social History:  reports that she has quit smoking. She has never used smokeless tobacco. She reports that she does not drink alcohol or use illicit drugs.  ROS:                                        Physical Exam: BP 119/69 mmHg  Pulse 91  Ht  5\' 6"  (1.676 m)  Wt 151 lb 9.6 oz (68.765 kg)  BMI 24.48 kg/m2  Constitutional:  Alert and oriented, No acute distress. HEENT: [place] AT, moist mucus membranes.  Trachea midline, no masses. Cardiovascular: No clubbing, cyanosis, or edema. Respiratory: Normal respiratory effort, no increased work of breathing. GI: Abdomen is soft, nontender, nondistended, no abdominal masses GU: No CVA tenderness.  Skin: No rashes, bruises or suspicious lesions. Lymph: No cervical or inguinal adenopathy. Neurologic: Grossly intact, no focal deficits, moving all 4 extremities. Psychiatric: Normal mood and affect.  Laboratory Data: Lab Results  Component Value Date   WBC 8.7 12/30/2014   HCT 34.8 12/30/2014    Lab Results  Component Value Date   CREATININE 0.88 12/30/2014    No results found for: PSA  No results found for: TESTOSTERONE  No results found for: HGBA1C  Urinalysis    Component Value Date/Time   BILIRUBINUR neg 12/30/2014 1649   PROTEINUR neg 12/30/2014 1649   UROBILINOGEN 0.2 12/30/2014 1649   NITRITE neg 12/30/2014 1649  LEUKOCYTESUR Negative 12/30/2014 1649    Pertinent Imaging: IMPRESSION: 1. 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 This renal mass is at high risk of continued/repeat hemorrhage, and consultation with interventional radiology is advised for consideration of embolization. 2. No hydronephrosis. 3. Small benign hemorrhagic renal cysts in the left kidney. These results will be called to the ordering clinician or representative by the Radiologist Assistant, and communication documented in the PACS or zVision Dashboard.   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 However, the general recommendation is to remove any angiomyolipoma over 5 cm due to the risk of spontaneous bleed. I do not believe  this lesion is amenable to a partial nephrectomy. Therefore, I recommended the patient undergo a right nephrectomy. I had a long conversation with the patient and her daughter-in-law (who works at [employer]) about the surgery. We discussed the risks, benefits, and alternatives to the surgery. Our discussion did include the risk of bleeding, infection, surgical misadventure, pneumonia, DVT among other risks. All of the patient's questions were answered. The patient is amenable to proceeding with the surgery. The case will be boarded with Dr. Elnoria Howard as a co-surgeon. The patient's daughter requested an On-Q pain pump for postoperative pain management.    Nickie Retort, MD  [facility] [address], [place], [place] [address] [phone]

## 2015-01-14 ENCOUNTER — Telehealth: Payer: Self-pay | Admitting: Radiology

## 2015-01-14 NOTE — Telephone Encounter (Signed)
Called to notify pt of surgery scheduled 02/19/15. Pt states she may have a scheduling conflict and will call back.

## 2015-01-15 NOTE — Telephone Encounter (Signed)
Pt called back to confirm surgery date of 02/19/15. Notified pt of pre-admit testing appt 02/10/15 @ 8:15 and to call the day before surgery for arrival time to SDS. Pt advised to be npo after mn day of surgery. Pt verbalizes understanding.

## 2015-02-10 ENCOUNTER — Other Ambulatory Visit: Payer: Medicare Other

## 2015-02-19 ENCOUNTER — Encounter: Admission: RE | Payer: Self-pay | Source: Ambulatory Visit

## 2015-02-19 ENCOUNTER — Inpatient Hospital Stay: Admission: RE | Admit: 2015-02-19 | Payer: Medicare Other | Source: Ambulatory Visit | Admitting: Urology

## 2015-02-19 SURGERY — NEPHRECTOMY, HAND-ASSISTED, LAPAROSCOPIC
Anesthesia: Choice | Laterality: Right

## 2015-02-21 ENCOUNTER — Ambulatory Visit (INDEPENDENT_AMBULATORY_CARE_PROVIDER_SITE_OTHER): Payer: Medicare Other | Admitting: Urology

## 2015-02-21 ENCOUNTER — Encounter: Payer: Self-pay | Admitting: Urology

## 2015-02-21 VITALS — BP 133/72 | HR 91 | Ht 64.0 in | Wt 147.8 lb

## 2015-02-21 DIAGNOSIS — D179 Benign lipomatous neoplasm, unspecified: Secondary | ICD-10-CM | POA: Diagnosis not present

## 2015-02-21 NOTE — Progress Notes (Signed)
02/21/2015 9:14 AM   Rachel Lopez 20-Sep-1940 [id number]  Referring provider: Glean Hess, MD [address] [place], [place] [address]  Chief Complaint  Patient presents with  . Discuss surgery    HPI: Patient is a 74 year old female presents today to discuss her recent MRI imaging. She has a 7.4 cm right angiomyolipoma on MRI renal protocol. She presents today to discuss definitive management.   PMH: Past Medical History  Diagnosis Date  . Thyroid disease   . Hypertension   . Hyperlipidemia   . Benign hypertension 10/18/2014  . Hypothyroidism, postablative 10/18/2014  . HLD (hyperlipidemia) 10/18/2014  . Papilloma of breast 10/28/2014  . Renal mass, right 01/01/2015    Surgical History: Past Surgical History  Procedure Laterality Date  . Breast biopsy Left 10/15/2014    papilloma  . Radical hysterectomy  1969  . Total thyroidectomy      age 40  . Appendectomy      67yr  . Hemorrhoid surgery  2014    Home Medications:    Medication List       This list is accurate as of: 02/21/15  9:14 AM.  Always use your most recent med list.               atorvastatin 20 MG tablet  Commonly known as:  LIPITOR  Take 1 tablet by mouth at bedtime.     levothyroxine 88 MCG tablet  Commonly known as:  SYNTHROID, LEVOTHROID  Take 1 tablet (88 mcg total) by mouth daily.     lisinopril 40 MG tablet  Commonly known as:  PRINIVIL,ZESTRIL  Take 1 tablet by mouth daily.        Allergies: No Known Allergies  Family History: Family History  Problem Relation Age of Onset  . Prostate cancer Neg Hx   . Breast cancer Neg Hx   . Bladder Cancer Neg Hx   . Kidney cancer Neg Hx     Social History:  reports that she has quit smoking. She has never used smokeless tobacco. She reports that she does not drink alcohol or use illicit drugs.  ROS: UROLOGY Frequent Urination?: No Hard to postpone urination?: No Burning/pain with urination?: No Get up at night to  urinate?: No Leakage of urine?: No Urine stream starts and stops?: No Trouble starting stream?: No Do you have to strain to urinate?: No Blood in urine?: No Urinary tract infection?: No Sexually transmitted disease?: No Injury to kidneys or bladder?: No Painful intercourse?: No Weak stream?: No Currently pregnant?: No Vaginal bleeding?: No Last menstrual period?: n  Gastrointestinal Nausea?: No Vomiting?: No Indigestion/heartburn?: No Diarrhea?: No Constipation?: No  Constitutional Fever: No Night sweats?: No Weight loss?: No Fatigue?: No  Skin Skin rash/lesions?: No Itching?: No  Eyes Blurred vision?: No Double vision?: No  Ears/Nose/Throat Sore throat?: No Sinus problems?: No  Hematologic/Lymphatic Swollen glands?: No Easy bruising?: No  Cardiovascular Leg swelling?: No Chest pain?: No  Respiratory Cough?: No Shortness of breath?: No  Endocrine Excessive thirst?: No  Musculoskeletal Back pain?: No Joint pain?: No  Neurological Headaches?: No Dizziness?: No  Psychologic Depression?: No Anxiety?: No  Physical Exam: BP 133/72 mmHg  Pulse 91  Ht 5\' 4"  (1.626 m)  Wt 147 lb 12.8 oz (67.042 kg)  BMI 25.36 kg/m2  Constitutional:  Alert and oriented, No acute distress. HEENT: [place] AT, moist mucus membranes.  Trachea midline, no masses. Cardiovascular: No clubbing, cyanosis, or edema. Respiratory: Normal respiratory effort, no  increased work of breathing. GI: Abdomen is soft, nontender, nondistended, no abdominal masses GU: No CVA tenderness.  Skin: No rashes, bruises or suspicious lesions. Lymph: No cervical or inguinal adenopathy. Neurologic: Grossly intact, no focal deficits, moving all 4 extremities. Psychiatric: Normal mood and affect.  Laboratory Data: Lab Results  Component Value Date   WBC 8.7 12/30/2014   HCT 34.8 12/30/2014    Lab Results  Component Value Date   CREATININE 0.88 12/30/2014    No results found for:  PSA  No results found for: TESTOSTERONE  No results found for: HGBA1C  Urinalysis    Component Value Date/Time   BILIRUBINUR neg 12/30/2014 1649   PROTEINUR neg 12/30/2014 1649   UROBILINOGEN 0.2 12/30/2014 1649   NITRITE neg 12/30/2014 1649   LEUKOCYTESUR Negative 12/30/2014 1649    Pertinent Imaging: IMPRESSION: 1. 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 This renal mass is at high risk of continued/repeat hemorrhage, and consultation with interventional radiology is advised for consideration of embolization. 2. No hydronephrosis. 3. Small benign hemorrhagic renal cysts in the left kidney. These results will be called to the ordering clinician or representative by the Radiologist Assistant, and communication documented in the PACS or zVision Dashboard.  Assessment & Plan:    1.  7.4 cm right renal angiomyolipoma I discussed with the patient that this is a benign lesion. However, the general recommendation is to remove any angiomyolipoma over 5 cm due to the risk of spontaneous bleed. I do not believe this lesion is amenable to a partial nephrectomy. Therefore, I recommended the patient undergo a right nephrectomy. I had a long conversation with the patient and her daughter-in-law (who works at [employer]) about the surgery. We discussed the risks, benefits, and alternatives to the surgery. Our discussion did include the risk of bleeding, infection, surgical misadventure, pneumonia, DVT among other risks. All of the patient's questions were answered. The patient is amenable to proceeding with the surgery. The case will be boarded with Dr. Erlene Quan as a co-surgeon. The patient's daughter requested an On-Q pain pump for postoperative pain management. We can also discussed her risk of spontaneous bleed as this was her concern and there is cancer surgery the first time. She understands this risk and  elected to proceed with the planned surgery.  Nickie Retort, MD  [facility] [address], [place], [place] [address] [phone]

## 2015-03-03 ENCOUNTER — Ambulatory Visit: Payer: Self-pay | Admitting: General Surgery

## 2015-03-03 ENCOUNTER — Telehealth: Payer: Self-pay | Admitting: Radiology

## 2015-03-03 NOTE — Telephone Encounter (Signed)
[name] to notify pt of pre-admit appt scheduled 03/06/15 @9 :45.

## 2015-03-03 NOTE — Telephone Encounter (Signed)
Notified pt of surgery scheduled 03/17/15, pre-admit appt on 03/06/15 @9 :45 and to call on 11/23 for arrival time to SDS. Pt voices understanding.

## 2015-03-04 ENCOUNTER — Other Ambulatory Visit: Payer: Self-pay | Admitting: Internal Medicine

## 2015-03-04 ENCOUNTER — Encounter: Payer: Self-pay | Admitting: Internal Medicine

## 2015-03-04 ENCOUNTER — Ambulatory Visit (INDEPENDENT_AMBULATORY_CARE_PROVIDER_SITE_OTHER): Payer: Medicare Other | Admitting: Internal Medicine

## 2015-03-04 VITALS — BP 110/64 | HR 88 | Ht 64.0 in | Wt 148.8 lb

## 2015-03-04 DIAGNOSIS — E890 Postprocedural hypothyroidism: Secondary | ICD-10-CM | POA: Diagnosis not present

## 2015-03-04 DIAGNOSIS — E785 Hyperlipidemia, unspecified: Secondary | ICD-10-CM | POA: Diagnosis not present

## 2015-03-04 DIAGNOSIS — Z0000 Encounter for general adult medical examination without abnormal findings: Secondary | ICD-10-CM

## 2015-03-04 DIAGNOSIS — D242 Benign neoplasm of left breast: Secondary | ICD-10-CM | POA: Diagnosis not present

## 2015-03-04 DIAGNOSIS — I10 Essential (primary) hypertension: Secondary | ICD-10-CM

## 2015-03-04 DIAGNOSIS — D3001 Benign neoplasm of right kidney: Secondary | ICD-10-CM

## 2015-03-04 LAB — POCT URINALYSIS DIPSTICK
BILIRUBIN UA: NEGATIVE
Blood, UA: NEGATIVE
Glucose, UA: NEGATIVE
KETONES UA: NEGATIVE
LEUKOCYTES UA: NEGATIVE
Nitrite, UA: NEGATIVE
PH UA: 5
Protein, UA: NEGATIVE
Spec Grav, UA: 1.01
Urobilinogen, UA: 0.2

## 2015-03-04 MED ORDER — LISINOPRIL 40 MG PO TABS: 40.0000 mg | ORAL_TABLET | Freq: Every day | ORAL | Status: DC

## 2015-03-04 NOTE — Progress Notes (Signed)
Patient: Rachel Lopez, Female    DOB: March 19, 1941, 74 y.o.   MRN: [id number] Visit Date: 03/04/2015  Today's Provider: Halina Maidens, MD   Chief Complaint  Patient presents with  . Medicare Wellness  . Hypertension  . Hyperlipidemia  . Hypothyroidism   Subjective:    Annual wellness visit Rachel Lopez is a 74 y.o. female who presents today for her Subsequent Annual Wellness Visit. She feels fairly well. She reports exercising none. She reports she is sleeping fairly well.   ----------------------------------------------------------- Hypertension This is a chronic problem. The current episode started more than 1 year ago. The problem is unchanged. The problem is controlled. Pertinent negatives include no chest pain, headaches, palpitations or shortness of breath. Risk factors for coronary artery disease include dyslipidemia. Past treatments include ACE inhibitors. The current treatment provides significant improvement. There are no compliance problems.  Hypertensive end-organ damage includes a thyroid problem. There is no history of kidney disease or CAD/MI. There is no history of chronic renal disease.  Thyroid Problem Presents for follow-up visit. Patient reports no cold intolerance, constipation, depressed mood, diaphoresis, diarrhea, fatigue, leg swelling or palpitations. The symptoms have been stable. Past treatments include levothyroxine. The treatment provided significant relief. Prior procedures include thyroidectomy. Her past medical history is significant for hyperlipidemia. There is no history of diabetes.  Hyperlipidemia This is a chronic problem. The current episode started more than 1 year ago. The problem is controlled. Recent lipid tests were reviewed and are normal. Exacerbating diseases include hypothyroidism. She has no history of chronic renal disease or diabetes. Pertinent negatives include no chest pain or shortness of breath. Current antihyperlipidemic treatment  includes statins. The current treatment provides significant improvement of lipids. There are no compliance problems.    benign right kidney mass - this was determined to be an angiomyolipoma. She was scheduled for excision earlier this month but canceled it because she became very anxious. She is concerned that her single kidney may not be adequate. The surgery is rescheduled for later this month. It will be done by 2 urologists at [facility].  Intraductal breast papilloma - this was diagnosed earlier this year by breast biopsy. Because of its benign nature she elected serial ultrasounds and mammograms rather than excision. She has a follow-up with the general surgeon next month. She denies any breast tenderness, mass, or nipple discharge. Review of Systems  Constitutional: Negative for fever, chills, diaphoresis and fatigue.  HENT: Negative for hearing loss, trouble swallowing and voice change.   Eyes: Negative for visual disturbance.  Respiratory: Negative for chest tightness, shortness of breath and wheezing.   Cardiovascular: Negative for chest pain, palpitations and leg swelling.  Gastrointestinal: Negative for vomiting, abdominal pain, diarrhea and constipation.  Endocrine: Negative for cold intolerance, polydipsia and polyuria.  Genitourinary: Negative for dysuria, hematuria, vaginal bleeding and vaginal discharge.  Musculoskeletal: Positive for joint swelling, arthralgias (right knee) and gait problem.  Skin: Negative for color change and rash.  Neurological: Negative for syncope, weakness, light-headedness, numbness and headaches.  Hematological: Negative for adenopathy. Does not bruise/bleed easily.  Psychiatric/Behavioral: Negative for confusion, sleep disturbance and dysphoric mood.    Social History   Social History  . Marital Status: Widowed    Spouse Name: N/A  . Number of Children: N/A  . Years of Education: N/A   Occupational History  . Not on file.   Social History Main  Topics  . Smoking status: Former Research scientist (life sciences)  . Smokeless tobacco: Never Used  . Alcohol Use:  No  . Drug Use: No  . Sexual Activity: Not on file   Other Topics Concern  . Not on file   Social History Narrative    Patient Active Problem List   Diagnosis Date Noted  . Angiomyolipoma of kidney 01/01/2015  . Papilloma of breast 10/28/2014  . HLD (hyperlipidemia) 10/18/2014  . Benign hypertension 10/18/2014  . Hypothyroidism, postablative 10/18/2014  . Intraductal papilloma of left breast 10/18/2014    Past Surgical History  Procedure Laterality Date  . Breast biopsy Left 10/15/2014    papilloma  . Radical hysterectomy  1969  . Total thyroidectomy      age 28  . Appendectomy      43yr  . Hemorrhoid surgery  2014    Her family history is negative for Prostate cancer, Breast cancer, Bladder Cancer, and Kidney cancer.    Previous Medications   ATORVASTATIN (LIPITOR) 20 MG TABLET    Take 1 tablet by mouth at bedtime.   LEVOTHYROXINE (SYNTHROID, LEVOTHROID) 88 MCG TABLET    Take 1 tablet (88 mcg total) by mouth daily.    Patient Care Team: Glean Hess, MD as PCP - General (Family Medicine) Glean Hess, MD (Family Medicine) Robert Bellow, MD (General Surgery)     Objective:   Vitals: BP 110/64 mmHg  Pulse 88  Ht 5\' 4"  (1.626 m)  Wt 148 lb 12.8 oz (67.495 kg)  BMI 25.53 kg/m2  Physical Exam  Constitutional: She is oriented to person, place, and time. She appears well-developed and well-nourished. No distress.  HENT:  Head: Normocephalic and atraumatic.  Right Ear: Tympanic membrane and ear canal normal.  Left Ear: Tympanic membrane and ear canal normal.  Nose: Right sinus exhibits no maxillary sinus tenderness. Left sinus exhibits no maxillary sinus tenderness.  Mouth/Throat: Uvula is midline and oropharynx is clear and moist.  Eyes: EOM are normal. Right eye exhibits no discharge. Left eye exhibits no discharge. No scleral icterus.  Neck: Normal range of  motion. Carotid bruit is not present. No erythema present. No thyroid mass (surgically absent) present.  Cardiovascular: Normal rate, regular rhythm, normal heart sounds and normal pulses.   Pulmonary/Chest: Effort normal and breath sounds normal. No respiratory distress. She has no wheezes. Right breast exhibits no mass, no nipple discharge, no skin change and no tenderness. Left breast exhibits no mass, no nipple discharge, no skin change and no tenderness.  Abdominal: Soft. Bowel sounds are normal. There is no hepatosplenomegaly. There is no tenderness. There is no rebound, no guarding and no CVA tenderness.  Musculoskeletal: Normal range of motion.  Lymphadenopathy:    She has no cervical adenopathy.    She has no axillary adenopathy.  Neurological: She is alert and oriented to person, place, and time. She has normal reflexes. No cranial nerve deficit or sensory deficit.  Skin: Skin is warm, dry and intact. No rash noted.  Psychiatric: She has a normal mood and affect. Her speech is normal and behavior is normal. Thought content normal.  Nursing note and vitals reviewed.   Activities of Daily Living In your present state of health, do you have any difficulty performing the following activities: 12/30/2014  Hearing? N  Vision? Y  Difficulty concentrating or making decisions? N  Walking or climbing stairs? N  Dressing or bathing? N  Doing errands, shopping? N    Fall Risk Assessment Fall Risk  12/30/2014  Falls in the past year? No     Patient reports there are safety  devices in place in shower at home.   Depression Screen PHQ 2/9 Scores 12/30/2014  PHQ - 2 Score 0    Cognitive Testing - 6-CIT   Correct? Score   What year is it? yes 0 Yes = 0    No = 4  What month is it? yes 0 Yes = 0    No = 3  Remember:     [place]ia [place], [place], [place]     What time is it? yes 0 Yes = 0    No = 3  Count backwards from 20 to 1 yes 0 Correct = 0    1 error = 2   More than 1 error = 4   Say the months of the year in reverse. yes 0 Correct = 0    1 error = 2   More than 1 error = 4  What address did I ask you to remember? no 1 Correct = 0  1 error = 2    2 error = 4    3 error = 6    4 error = 8    All wrong = 10       TOTAL SCORE  1/28   Interpretation:  Normal  Normal (0-7) Abnormal (8-28)        Assessment & Plan:     Annual Wellness Visit  Reviewed patient's Family Medical History Reviewed and updated list of patient's medical providers Assessment of cognitive impairment was done Assessed patient's functional ability Established a written schedule for health screening [place] Completed and Reviewed  Exercise Activities and Dietary recommendations Goals    . Cut out extra servings       Immunization History  Administered Date(s) Administered  . Influenza-Unspecified 01/27/2015  . Pneumococcal Conjugate-13 09/10/2014    Health Maintenance  Topic Date Due  . TETANUS/TDAP  01/22/1961  . DEXA SCAN  01/23/2007  . PNA vac Low Risk Adult (2 of 2 - PPSV23) 09/10/2015  . INFLUENZA VACCINE  11/18/2015  . MAMMOGRAM  10/14/2016  . COLONOSCOPY  04/20/2021  . ZOSTAVAX  Addressed     Discussed health benefits of physical activity, and encouraged her to engage in regular exercise appropriate for her age and condition.    ------------------------------------------------------------------------------------------------------------  1. Medicare annual wellness visit, subsequent Medicare wellness measures satisfied Tetanus booster is deferred due to upcoming surgery Will discuss bone density testing at next visit - POCT urinalysis dipstick  2. Benign hypertension Controlled on medication - lisinopril (PRINIVIL,ZESTRIL) 40 MG tablet; Take 1 tablet (40 mg total) by mouth daily.  Dispense: 90 tablet; Refill: 3 - Comprehensive metabolic panel  3. Hypothyroidism, postablative Continue supplementation; adjust dose if needed - TSH  4.  HLD (hyperlipidemia) Doing well on statin therapy - Lipid panel  5. Intraductal papilloma of left breast Follow-up with Gen. surgery next month  6. Angiomyolipoma of kidney, right Right nephrectomy scheduled for later this month Patient is reassured her remaining kidney will function adequately but she should avoid high doses of nsaids and tylenol.   Halina Maidens, MD [facility]  03/04/2015

## 2015-03-04 NOTE — Patient Instructions (Signed)
Health Maintenance  Topic Date Due  . TETANUS/TDAP  01/22/1961  . COLONOSCOPY  01/23/1992  . ZOSTAVAX  01/22/2002  . DEXA SCAN  01/23/2007  . PNA vac Low Risk Adult (2 of 2 - PPSV23) 09/10/2015  . INFLUENZA VACCINE  11/18/2015  . MAMMOGRAM  10/14/2016

## 2015-03-05 LAB — LIPID PANEL
CHOL/HDL RATIO: 4.6 ratio — AB (ref 0.0–4.4)
Cholesterol, Total: 198 mg/dL (ref 100–199)
HDL: 43 mg/dL (ref >39)
LDL Calculated: 111 mg/dL — ABNORMAL HIGH (ref 0–99)
Triglycerides: 219 mg/dL — ABNORMAL HIGH (ref 0–149)
VLDL Cholesterol Cal: 44 mg/dL — ABNORMAL HIGH (ref 5–40)

## 2015-03-05 LAB — COMPREHENSIVE METABOLIC PANEL
ALBUMIN: 4.7 g/dL (ref 3.5–4.8)
ALT: 12 IU/L (ref 0–32)
AST: 16 IU/L (ref 0–40)
Albumin/Globulin Ratio: 2.1 (ref 1.1–2.5)
Alkaline Phosphatase: 96 IU/L (ref 39–117)
BILIRUBIN TOTAL: 0.4 mg/dL (ref 0.0–1.2)
BUN / CREAT RATIO: 13 (ref 11–26)
BUN: 12 mg/dL (ref 8–27)
CO2: 24 mmol/L (ref 18–29)
Calcium: 9.7 mg/dL (ref 8.7–10.3)
Chloride: 101 mmol/L (ref 97–106)
Creatinine, Ser: 0.92 mg/dL (ref 0.57–1.00)
GFR calc non Af Amer: 62 mL/min/{1.73_m2} (ref >59)
GFR, EST AFRICAN AMERICAN: 71 mL/min/{1.73_m2} (ref >59)
GLOBULIN, TOTAL: 2.2 g/dL (ref 1.5–4.5)
Glucose: 68 mg/dL (ref 65–99)
Potassium: 4.9 mmol/L (ref 3.5–5.2)
SODIUM: 143 mmol/L (ref 136–144)
TOTAL PROTEIN: 6.9 g/dL (ref 6.0–8.5)

## 2015-03-05 LAB — TSH: TSH: 0.279 u[IU]/mL — AB (ref 0.450–4.500)

## 2015-03-06 ENCOUNTER — Other Ambulatory Visit: Payer: Self-pay

## 2015-03-06 ENCOUNTER — Encounter
Admission: RE | Admit: 2015-03-06 | Discharge: 2015-03-06 | Disposition: A | Discharge status: Home / Self Care | Payer: Medicare Other | Source: Ambulatory Visit | Attending: Urology | Admitting: Urology

## 2015-03-06 DIAGNOSIS — Z01812 Encounter for preprocedural laboratory examination: Secondary | ICD-10-CM | POA: Diagnosis not present

## 2015-03-06 DIAGNOSIS — Z01810 Encounter for preprocedural cardiovascular examination: Secondary | ICD-10-CM | POA: Insufficient documentation

## 2015-03-06 DIAGNOSIS — I10 Essential (primary) hypertension: Secondary | ICD-10-CM | POA: Diagnosis not present

## 2015-03-06 HISTORY — DX: Unspecified osteoarthritis, unspecified site: M19.90

## 2015-03-06 HISTORY — DX: Chronic kidney disease, unspecified: N18.9

## 2015-03-06 LAB — DIFFERENTIAL
Basophils Absolute: 0.1 10*3/uL (ref 0–0.1)
Basophils Relative: 1 %
EOS PCT: 5 %
Eosinophils Absolute: 0.3 10*3/uL (ref 0–0.7)
LYMPHS ABS: 1.2 10*3/uL (ref 1.0–3.6)
LYMPHS PCT: 18 %
Monocytes Absolute: 0.6 10*3/uL (ref 0.2–0.9)
Monocytes Relative: 9 %
NEUTROS PCT: 67 %
Neutro Abs: 4.8 10*3/uL (ref 1.4–6.5)

## 2015-03-06 LAB — URINALYSIS COMPLETE WITH MICROSCOPIC (ARMC ONLY)
BACTERIA UA: NONE SEEN
Bilirubin Urine: NEGATIVE
GLUCOSE, UA: NEGATIVE mg/dL
Hgb urine dipstick: NEGATIVE
Ketones, ur: NEGATIVE mg/dL
Leukocytes, UA: NEGATIVE
Nitrite: NEGATIVE
PROTEIN: NEGATIVE mg/dL
RBC / HPF: NONE SEEN RBC/hpf (ref 0–5)
Specific Gravity, Urine: 1.003 — ABNORMAL LOW (ref 1.005–1.030)
pH: 6 (ref 5.0–8.0)

## 2015-03-06 LAB — BASIC METABOLIC PANEL
ANION GAP: 6 (ref 5–15)
BUN: 14 mg/dL (ref 6–20)
CHLORIDE: 105 mmol/L (ref 101–111)
CO2: 29 mmol/L (ref 22–32)
CREATININE: 0.8 mg/dL (ref 0.44–1.00)
Calcium: 9.2 mg/dL (ref 8.9–10.3)
GFR calc non Af Amer: >60 mL/min (ref >60)
Glucose, Bld: 87 mg/dL (ref 65–99)
POTASSIUM: 4.4 mmol/L (ref 3.5–5.1)
Sodium: 140 mmol/L (ref 135–145)

## 2015-03-06 LAB — CBC
HCT: 45.8 % (ref 35.0–47.0)
Hemoglobin: 14.8 g/dL (ref 12.0–16.0)
MCH: 28.2 pg (ref 26.0–34.0)
MCHC: 32.3 g/dL (ref 32.0–36.0)
MCV: 87.3 fL (ref 80.0–100.0)
Platelets: 249 10*3/uL (ref 150–440)
RBC: 5.24 MIL/uL — AB (ref 3.80–5.20)
RDW: 13.9 % (ref 11.5–14.5)
WBC: 7 10*3/uL (ref 3.6–11.0)

## 2015-03-06 LAB — TYPE AND SCREEN
ABO/RH(D): O POS
ANTIBODY SCREEN: NEGATIVE

## 2015-03-06 LAB — PROTIME-INR
INR: 1
Prothrombin Time: 13.4 seconds (ref 11.4–15.0)

## 2015-03-06 LAB — APTT: APTT: 31 s (ref 24–36)

## 2015-03-06 LAB — ABO/RH: ABO/RH(D): O POS

## 2015-03-06 NOTE — Patient Instructions (Signed)
  Your procedure is scheduled on: March 17, 2015 (Monday) Report to Day Surgery.[facility]) To find out your arrival time please call [phone] between 1PM - 3PM on March 14, 2015 (Friday).  Remember: Instructions that are not followed completely may result in serious medical risk, up to and including death, or upon the discretion of your surgeon and anesthesiologist your surgery may need to be rescheduled.    __x__ 1. Do not eat food or drink liquids after midnight. No gum chewing or hard candies.     ____ 2. No Alcohol for 24 hours before or after surgery.   ____ 3. Bring all medications with you on the day of surgery if instructed.    __x__ 4. Notify your doctor if there is any change in your medical condition     (cold, fever, infections).     Do not wear jewelry, make-up, hairpins, clips or nail polish.  Do not wear lotions, powders, or perfumes. You may wear deodorant.  Do not shave 48 hours prior to surgery. Men may shave face and neck.  Do not bring valuables to the hospital.    [facility] is not responsible for any belongings or valuables.               Contacts, dentures or bridgework may not be worn into surgery.  Leave your suitcase in the car. After surgery it may be brought to your room.  For patients admitted to the hospital, discharge time is determined by your                treatment team.   Patients discharged the day of surgery will not be allowed to drive home.   Please read over the following fact sheets that you were given:   Surgical Site Infection Prevention   ____ Take these medicines the morning of surgery with A SIP OF WATER:    1. Lisinopril  2.   3.   4.  5.  6.  ____ Fleet Enema (as directed)   _x___ Use CHG Soap as directed  ____ Use inhalers on the day of surgery  ____ Stop metformin 2 days prior to surgery    ____ Take 1/2 of usual insulin dose the night before surgery and none on the morning of surgery.   ____ Stop  Coumadin/Plavix/aspirin on   __x__ Stop Anti-inflammatories on (TYLENOL OK TO TAKE FOR PAIN IF NEEDED)   ____ Stop supplements until after surgery.    ____ Bring C-Pap to the hospital.

## 2015-03-06 NOTE — Pre-Procedure Instructions (Signed)
Medical Clearance request for surgery faxed to Dr. Army Melia office, and Dr Pilar Jarvis office notified (spoke with Sherlon Handing, RN)

## 2015-03-07 LAB — URINE CULTURE

## 2015-03-11 ENCOUNTER — Other Ambulatory Visit: Payer: Self-pay | Admitting: Internal Medicine

## 2015-03-11 ENCOUNTER — Other Ambulatory Visit: Payer: Medicare Other

## 2015-03-11 ENCOUNTER — Other Ambulatory Visit: Payer: Medicare Other | Admitting: Obstetrics and Gynecology

## 2015-03-11 DIAGNOSIS — Z01818 Encounter for other preprocedural examination: Secondary | ICD-10-CM | POA: Diagnosis not present

## 2015-03-11 DIAGNOSIS — D3001 Benign neoplasm of right kidney: Secondary | ICD-10-CM

## 2015-03-11 DIAGNOSIS — D179 Benign lipomatous neoplasm, unspecified: Secondary | ICD-10-CM | POA: Diagnosis not present

## 2015-03-11 DIAGNOSIS — R9431 Abnormal electrocardiogram [ECG] [EKG]: Secondary | ICD-10-CM

## 2015-03-11 DIAGNOSIS — N390 Urinary tract infection, site not specified: Secondary | ICD-10-CM | POA: Diagnosis not present

## 2015-03-11 LAB — URINALYSIS, COMPLETE
BILIRUBIN UA: NEGATIVE
Glucose, UA: NEGATIVE
KETONES UA: NEGATIVE
LEUKOCYTES UA: NEGATIVE
Nitrite, UA: NEGATIVE
Protein, UA: NEGATIVE
RBC UA: NEGATIVE
UUROB: 0.2 mg/dL (ref 0.2–1.0)
pH, UA: 6 (ref 5.0–7.5)

## 2015-03-11 LAB — MICROSCOPIC EXAMINATION
Bacteria, UA: NONE SEEN
EPITHELIAL CELLS (NON RENAL): NONE SEEN /HPF (ref 0–10)
RBC, UA: NONE SEEN /hpf (ref 0–?)
WBC, UA: NONE SEEN /hpf (ref 0–?)

## 2015-03-11 NOTE — Addendum Note (Signed)
Addended by: Tommy Rainwater on: 03/11/2015 04:23 PM   Modules accepted: Orders

## 2015-03-12 NOTE — OR Nursing (Signed)
Info received from pcp,patient needs cardiac clearance. Info faxed to Dr Pilar Jarvis. PCP office arranging referral.Spoke with [facility]

## 2015-03-13 LAB — CULTURE, URINE COMPREHENSIVE

## 2015-03-17 ENCOUNTER — Encounter: Admission: RE | Payer: Self-pay | Source: Ambulatory Visit

## 2015-03-17 ENCOUNTER — Inpatient Hospital Stay: Admission: RE | Admit: 2015-03-17 | Payer: Medicare Other | Source: Ambulatory Visit | Admitting: Urology

## 2015-03-17 SURGERY — NEPHRECTOMY, HAND-ASSISTED, LAPAROSCOPIC
Anesthesia: Choice | Laterality: Right

## 2015-03-19 ENCOUNTER — Telehealth: Payer: Self-pay | Admitting: *Deleted

## 2015-03-19 ENCOUNTER — Ambulatory Visit: Payer: Medicare Other | Admitting: General Surgery

## 2015-03-19 NOTE — Telephone Encounter (Signed)
[facility] calling needed to schedule patient for appointment for Abnormal EKG Please read the EKG and we can scheduled based on your recommendation.  Thank you.

## 2015-03-25 ENCOUNTER — Ambulatory Visit: Payer: Medicare Other | Admitting: Cardiovascular Disease

## 2015-03-27 ENCOUNTER — Ambulatory Visit (INDEPENDENT_AMBULATORY_CARE_PROVIDER_SITE_OTHER): Payer: Medicare Other | Admitting: Cardiovascular Disease

## 2015-03-27 ENCOUNTER — Encounter: Payer: Self-pay | Admitting: Cardiovascular Disease

## 2015-03-27 VITALS — BP 112/70 | HR 66 | Ht 64.0 in | Wt 148.0 lb

## 2015-03-27 DIAGNOSIS — E890 Postprocedural hypothyroidism: Secondary | ICD-10-CM | POA: Diagnosis not present

## 2015-03-27 DIAGNOSIS — Z01810 Encounter for preprocedural cardiovascular examination: Secondary | ICD-10-CM | POA: Insufficient documentation

## 2015-03-27 DIAGNOSIS — Z01818 Encounter for other preprocedural examination: Secondary | ICD-10-CM | POA: Diagnosis not present

## 2015-03-27 DIAGNOSIS — E785 Hyperlipidemia, unspecified: Secondary | ICD-10-CM

## 2015-03-27 DIAGNOSIS — I10 Essential (primary) hypertension: Secondary | ICD-10-CM | POA: Diagnosis not present

## 2015-03-27 DIAGNOSIS — D3001 Benign neoplasm of right kidney: Secondary | ICD-10-CM

## 2015-03-27 NOTE — Assessment & Plan Note (Signed)
We will forward the preoperative note to Dr. Pilar Jarvis. No further cardiac testing needed prior to surgery

## 2015-03-27 NOTE — Progress Notes (Signed)
Patient ID: Rachel Lopez, female    DOB: [birth year], 74 y.o.   MRN: [id number]  HPI Comments: Rachel Lopez is a very pleasant 74 year old woman with history of right renal mass, hyponatremia, hypothyroidism following thyroid surgery at age 74, hypertension who presents for preoperative evaluation prior to renal surgery, abnormal EKG in preoperative evaluation.  She denies any significant cardiac history. Former smoker but quit 30 years ago Has been treated with cholesterol medication for many years. Denies any symptoms of angina or shortness of breath on exertion. Moved from [place] around 2 years ago, has not been as active as before but has been walking her dog on a more regular basis at least 20 minutes. On exertion she denies any symptoms, does not have to stop to catch her breath. She can walk at a relatively brisk pace. Otherwise tends to her ADLs and chores without any significant difficulty. Currently not married, has to do all of her chores on her own, does have some help from her son.  She reports waking in November this year with flank pain, develops diaphoresis, lightheadedness Flank pain persisted, workup revealing right renal mass, benign tumor on MRI Recommendation has been made for resection, hopefully through laparoscopic approach  MRA of the abdomen reviewed showing no significant PAD Cholesterol reviewed, total cholesterol 190s LDL 110 on Lipitor 20 mg daily  EKG from today shows normal sinus rhythm with rate 66 bpm, poor R-wave progression through the anterior precordial leads otherwise normal EKG Prior EKG done in November 2016 is essentially the same, again poor R-wave progression, likely secondary to lead placement  No prior cardiac testing available for review    No Known Allergies  Current Outpatient Prescriptions on File Prior to Visit  Medication Sig Dispense Refill  . atorvastatin (LIPITOR) 20 MG tablet Take 20 mg by mouth at bedtime.     Marland Kitchen levothyroxine  (SYNTHROID, LEVOTHROID) 88 MCG tablet Take 1 tablet (88 mcg total) by mouth daily. 90 tablet 3  . lisinopril (PRINIVIL,ZESTRIL) 40 MG tablet Take 1 tablet (40 mg total) by mouth daily. 90 tablet 3   No current facility-administered medications on file prior to visit.    Past Medical History  Diagnosis Date  . Thyroid disease   . Hypertension   . Hyperlipidemia   . Benign hypertension 10/18/2014  . Hypothyroidism, postablative 10/18/2014  . HLD (hyperlipidemia) 10/18/2014  . Papilloma of breast 10/28/2014  . Renal mass, right 01/01/2015  . Chronic kidney disease   . Arthritis     Past Surgical History  Procedure Laterality Date  . Breast biopsy Left 10/15/2014    papilloma  . Radical hysterectomy  1969  . Total thyroidectomy      age 74  . Appendectomy      48yr  . Hemorrhoid surgery  2014  . Abdominal hysterectomy      Social History  reports that she quit smoking about 36 years ago. Her smoking use included Cigarettes. She has a 10 pack-year smoking history. She has never used smokeless tobacco. She reports that she does not drink alcohol or use illicit drugs.  Family History family history is negative for Prostate cancer, Breast cancer, Bladder Cancer, and Kidney cancer.   Review of Systems  Constitutional: Negative.   HENT: Negative.   Respiratory: Negative.   Cardiovascular: Negative.   Gastrointestinal: Negative.   Musculoskeletal: Negative.   Neurological: Negative.   Hematological: Negative.   Psychiatric/Behavioral: Negative.   All other systems reviewed and are negative.  BP 112/70 mmHg  Pulse 66  Ht 5\' 4"  (1.626 m)  Wt 148 lb (67.132 kg)  BMI 25.39 kg/m2  Physical Exam  Constitutional: She is oriented to person, place, and time. She appears well-developed and well-nourished.  HENT:  Head: Normocephalic.  Nose: Nose normal.  Mouth/Throat: Oropharynx is clear and moist.  Eyes: Conjunctivae are normal. Pupils are equal, round, and reactive to light.   Neck: Normal range of motion. Neck supple. No JVD present.  Cardiovascular: Normal rate, regular rhythm, normal heart sounds and intact distal pulses.  Exam reveals no gallop and no friction rub.   No murmur heard. Pulmonary/Chest: Effort normal and breath sounds normal. No respiratory distress. She has no wheezes. She has no rales. She exhibits no tenderness.  Abdominal: Soft. Bowel sounds are normal. She exhibits no distension. There is no tenderness.  Musculoskeletal: Normal range of motion. She exhibits no edema or tenderness.  Lymphadenopathy:    She has no cervical adenopathy.  Neurological: She is alert and oriented to person, place, and time. Coordination normal.  Skin: Skin is warm and dry. No rash noted. No erythema.  Psychiatric: She has a normal mood and affect. Her behavior is normal. Judgment and thought content normal.

## 2015-03-27 NOTE — Assessment & Plan Note (Signed)
Managed by Dr. Army Melia Thyroid surgery at age 74

## 2015-03-27 NOTE — Assessment & Plan Note (Signed)
She is acceptable risk for right renal cancer resection with Dr. Pilar Jarvis. No further testing needed. No prior cardiac history, apart from remote history of smoking, hyperlipidemia which recently has been well treated, hypertension which again has been well treated, no other risk factors. MRI showing no significant peripheral arterial disease. EKG is essentially normal, poor R-wave progression through the anterior precordial leads, no evidence of anterior or inferior MI. Mild abnormality likely secondary to lead placement. Currently with no symptoms concerning for angina, active at baseline walking her dog on a regular basis for at least 20 minutes. No further testing needed.

## 2015-03-27 NOTE — Assessment & Plan Note (Signed)
Blood pressure is well controlled on today's visit. No changes made to the medications. Recommended she call Dr. Army Melia if she has any lightheadedness on standing concerning for orthostasis.

## 2015-03-27 NOTE — Patient Instructions (Addendum)
You are doing well. No medication changes were made.  If you ever want to look at the blockage in the heart, Call the office (research "CT coronary calcium score")  Please call us if you have new issues that need to be addressed before your next appt.

## 2015-03-27 NOTE — Assessment & Plan Note (Signed)
Encouraged her just on her Lipitor. Cholesterol at a reasonable range. We did suggest if she was very concerned about underlying coronary artery disease and further risk assessment, a CT coronary calcium score could be ordered with a phone call. I do not think she really needs this but if she is wanting further risk stratification, this could be done

## 2015-03-28 ENCOUNTER — Telehealth: Payer: Self-pay | Admitting: Radiology

## 2015-03-28 NOTE — Telephone Encounter (Signed)
Pt notified of surgery scheduled 04/16/15, pre-admit testing appt on 04/03/15 @8 :15, and to call day prior to surgery for arrival to [facility]. Pt voices understanding.

## 2015-04-01 ENCOUNTER — Other Ambulatory Visit: Payer: Self-pay

## 2015-04-01 MED ORDER — ATORVASTATIN CALCIUM 20 MG PO TABS: 20.0000 mg | ORAL_TABLET | Freq: Every day | ORAL | Status: DC

## 2015-04-02 ENCOUNTER — Ambulatory Visit: Payer: Medicare Other

## 2015-04-02 ENCOUNTER — Ambulatory Visit (INDEPENDENT_AMBULATORY_CARE_PROVIDER_SITE_OTHER): Payer: Medicare Other | Admitting: General Surgery

## 2015-04-02 ENCOUNTER — Encounter: Payer: Self-pay | Admitting: General Surgery

## 2015-04-02 VITALS — BP 104/64 | HR 76 | Resp 12 | Ht 64.0 in | Wt 149.0 lb

## 2015-04-02 DIAGNOSIS — N63 Unspecified lump in breast: Secondary | ICD-10-CM | POA: Diagnosis not present

## 2015-04-02 DIAGNOSIS — D242 Benign neoplasm of left breast: Secondary | ICD-10-CM

## 2015-04-02 DIAGNOSIS — N6320 Unspecified lump in the left breast, unspecified quadrant: Secondary | ICD-10-CM

## 2015-04-02 NOTE — Progress Notes (Signed)
Patient ID: Rachel Lopez, female   DOB: [birth year], 74 y.o.   MRN: [id number]  Chief Complaint  Patient presents with  . Follow-up    HPI Rachel Lopez is a 74 y.o. female.  Here today for follow up left breast ultrasound. She states she is doing well. She is having a right nephrectomy on 04-16-15 for angiomyolipoma by Dr Nickie Retort.  The patient underwent core biopsy of a papilloma of the left breast 6 months ago. Ultrasound showed a 4 mm lesion, 3 mm benign lesion on pathology. This is a scheduled 6 month follow-up to assess for progression. The patient remains asymptomatic.  A personal reviewed the patient's history.  HPI  Past Medical History  Diagnosis Date  . Thyroid disease   . Hypertension   . Hyperlipidemia   . Benign hypertension 10/18/2014  . Hypothyroidism, postablative 10/18/2014  . HLD (hyperlipidemia) 10/18/2014  . Papilloma of breast 10/28/2014  . Renal mass, right 01/01/2015  . Chronic kidney disease   . Arthritis     Past Surgical History  Procedure Laterality Date  . Breast biopsy Left 10/15/2014    papilloma  . Radical hysterectomy  1969  . Total thyroidectomy      age 51  . Appendectomy      85yr  . Hemorrhoid surgery  2014  . Abdominal hysterectomy      Family History  Problem Relation Age of Onset  . Prostate cancer Neg Hx   . Breast cancer Neg Hx   . Bladder Cancer Neg Hx   . Kidney cancer Neg Hx     Social History Social History  Substance Use Topics  . Smoking status: Former Smoker -- 0.50 packs/day for 20 years    Types: Cigarettes    Quit date: 02/18/1979  . Smokeless tobacco: Never Used  . Alcohol Use: No    No Known Allergies  Current Outpatient Prescriptions  Medication Sig Dispense Refill  . atorvastatin (LIPITOR) 20 MG tablet Take 1 tablet (20 mg total) by mouth at bedtime. 30 tablet 5  . levothyroxine (SYNTHROID, LEVOTHROID) 88 MCG tablet Take 1 tablet (88 mcg total) by mouth daily. 90 tablet 3  . lisinopril  (PRINIVIL,ZESTRIL) 40 MG tablet Take 1 tablet (40 mg total) by mouth daily. 90 tablet 3   No current facility-administered medications for this visit.    Review of Systems Review of Systems  Constitutional: Negative.   Respiratory: Negative.   Cardiovascular: Negative.     Blood pressure 104/64, pulse 76, resp. rate 12, height 5\' 4"  (1.626 m), weight 149 lb (67.586 kg).  Physical Exam Physical Exam  Constitutional: She is oriented to person, place, and time. She appears well-developed and well-nourished.  HENT:  Mouth/Throat: Oropharynx is clear and moist.  Eyes: Conjunctivae are normal. No scleral icterus.  Neck: Neck supple.  Cardiovascular: Normal rate, regular rhythm and normal heart sounds.   Pulmonary/Chest: Effort normal and breath sounds normal. Right breast exhibits no inverted nipple, no mass, no nipple discharge, no skin change and no tenderness. Left breast exhibits no inverted nipple, no mass, no nipple discharge, no skin change and no tenderness.  Left breast > right breast.  Lymphadenopathy:    She has no cervical adenopathy.    She has no axillary adenopathy.  Neurological: She is alert and oriented to person, place, and time.  Skin: Skin is warm and dry.  Psychiatric: Her behavior is normal.    Data Reviewed Ultrasound examination of the left breast and  the 4:00 position, 1 cm from the nipple shows a 0.31 x 0.37 x 0.48 cm isoechoic nodule within a fluid pocket measuring up to 1.26 cm in diameter. The fluid component is significantly increased since her original procedure. BI-RADS-3.  Assessment    Papilloma left breast.    Plan    With the increased fluid around the primary lesion I recommended formal excision. Her surgery for the kidney as scheduled at the end of the month, but I will be out of town. I offered to have this completed by my partner while she is under anesthesia. She declined. She can likely have the safely completed as a local procedure in  the office without difficulty.     Follow up in 6 weeks in the office with left breast mass excision.  PCP:  Lily Kocher 04/04/2015, 8:53 PM

## 2015-04-02 NOTE — Patient Instructions (Addendum)
The patient is aware to call back for any questions or concerns. Follow up in 6 weeks in the office with left breast mass excision

## 2015-04-03 ENCOUNTER — Encounter
Admission: RE | Admit: 2015-04-03 | Discharge: 2015-04-03 | Disposition: A | Discharge status: Home / Self Care | Payer: Medicare Other | Source: Ambulatory Visit | Attending: Urology | Admitting: Urology

## 2015-04-03 DIAGNOSIS — R9431 Abnormal electrocardiogram [ECG] [EKG]: Secondary | ICD-10-CM | POA: Insufficient documentation

## 2015-04-03 DIAGNOSIS — D1771 Benign lipomatous neoplasm of kidney: Secondary | ICD-10-CM | POA: Insufficient documentation

## 2015-04-03 DIAGNOSIS — Z01812 Encounter for preprocedural laboratory examination: Secondary | ICD-10-CM | POA: Diagnosis not present

## 2015-04-03 LAB — URINALYSIS COMPLETE WITH MICROSCOPIC (ARMC ONLY)
BACTERIA UA: NONE SEEN
BILIRUBIN URINE: NEGATIVE
Glucose, UA: NEGATIVE mg/dL
HGB URINE DIPSTICK: NEGATIVE
Ketones, ur: NEGATIVE mg/dL
LEUKOCYTES UA: NEGATIVE
Nitrite: NEGATIVE
PROTEIN: NEGATIVE mg/dL
RBC / HPF: NONE SEEN RBC/hpf (ref 0–5)
Specific Gravity, Urine: 1.003 — ABNORMAL LOW (ref 1.005–1.030)
pH: 6 (ref 5.0–8.0)

## 2015-04-03 LAB — CBC
HEMATOCRIT: 44.1 % (ref 35.0–47.0)
Hemoglobin: 14.8 g/dL (ref 12.0–16.0)
MCH: 29 pg (ref 26.0–34.0)
MCHC: 33.4 g/dL (ref 32.0–36.0)
MCV: 86.8 fL (ref 80.0–100.0)
PLATELETS: 228 10*3/uL (ref 150–440)
RBC: 5.09 MIL/uL (ref 3.80–5.20)
RDW: 13.3 % (ref 11.5–14.5)
WBC: 5.5 10*3/uL (ref 3.6–11.0)

## 2015-04-03 LAB — PROTIME-INR
INR: 0.97
PROTHROMBIN TIME: 13.1 s (ref 11.4–15.0)

## 2015-04-03 LAB — BASIC METABOLIC PANEL
Anion gap: 7 (ref 5–15)
BUN: 13 mg/dL (ref 6–20)
CO2: 30 mmol/L (ref 22–32)
CREATININE: 0.81 mg/dL (ref 0.44–1.00)
Calcium: 9.1 mg/dL (ref 8.9–10.3)
Chloride: 106 mmol/L (ref 101–111)
GFR calc Af Amer: >60 mL/min (ref >60)
GLUCOSE: 71 mg/dL (ref 65–99)
Potassium: 4.1 mmol/L (ref 3.5–5.1)
SODIUM: 143 mmol/L (ref 135–145)

## 2015-04-03 LAB — TYPE AND SCREEN
ABO/RH(D): O POS
Antibody Screen: NEGATIVE

## 2015-04-03 LAB — APTT: aPTT: 31 seconds (ref 24–36)

## 2015-04-03 NOTE — Patient Instructions (Signed)
  Your procedure is scheduled on: Wednesday 04/16/2015 Report to Day Surgery. 2ND FLOOR MEDICAL MALL ENTRANCE To find out your arrival time please call [phone] between 1PM - 3PM on Tuesday 04/15/2015.  Remember: Instructions that are not followed completely may result in serious medical risk, up to and including death, or upon the discretion of your surgeon and anesthesiologist your surgery may need to be rescheduled.    __X__ 1. Do not eat food or drink liquids after midnight. No gum chewing or hard candies.     __X__ 2. No Alcohol for 24 hours before or after surgery.   ____ 3. Bring all medications with you on the day of surgery if instructed.    __X__ 4. Notify your doctor if there is any change in your medical condition     (cold, fever, infections).     Do not wear jewelry, make-up, hairpins, clips or nail polish.  Do not wear lotions, powders, or perfumes.   Do not shave 48 hours prior to surgery. Men may shave face and neck.  Do not bring valuables to the hospital.    [facility] is not responsible for any belongings or valuables.               Contacts, dentures or bridgework may not be worn into surgery.  Leave your suitcase in the car. After surgery it may be brought to your room.  For patients admitted to the hospital, discharge time is determined by your                treatment team.   Patients discharged the day of surgery will not be allowed to drive home.   Please read over the following fact sheets that you were given:   Surgical Site Infection Prevention   __X__ Take these medicines the morning of surgery with A SIP OF WATER:    1. LEVOTHYROXINE  2. LISINOPRIL  3.   4.  5.  6.  ____ Fleet Enema (as directed)   __X__ Use CHG Soap as directed  ____ Use inhalers on the day of surgery  ____ Stop metformin 2 days prior to surgery    ____ Take 1/2 of usual insulin dose the night before surgery and none on the morning of surgery.   ____ Stop  Coumadin/Plavix/aspirin on   ____ Stop Anti-inflammatories on    ____ Stop supplements until after surgery.    ____ Bring C-Pap to the hospital.

## 2015-04-04 LAB — URINE CULTURE

## 2015-04-07 ENCOUNTER — Other Ambulatory Visit: Payer: Medicare Other

## 2015-04-08 ENCOUNTER — Ambulatory Visit (INDEPENDENT_AMBULATORY_CARE_PROVIDER_SITE_OTHER): Payer: Medicare Other

## 2015-04-08 DIAGNOSIS — N390 Urinary tract infection, site not specified: Secondary | ICD-10-CM

## 2015-04-08 LAB — URINALYSIS, COMPLETE
BILIRUBIN UA: NEGATIVE
Glucose, UA: NEGATIVE
KETONES UA: NEGATIVE
LEUKOCYTES UA: NEGATIVE
Nitrite, UA: NEGATIVE
PH UA: 6 (ref 5.0–7.5)
PROTEIN UA: NEGATIVE
RBC UA: NEGATIVE
UUROB: 0.2 mg/dL (ref 0.2–1.0)

## 2015-04-08 LAB — MICROSCOPIC EXAMINATION
BACTERIA UA: NONE SEEN
EPITHELIAL CELLS (NON RENAL): NONE SEEN /HPF (ref 0–10)
RBC, UA: NONE SEEN /hpf (ref 0–?)
WBC UA: NONE SEEN /HPF (ref 0–?)

## 2015-04-08 NOTE — Progress Notes (Signed)
In and Out Catheterization  Patient is present today for a I & O catheterization due to pre-op. Patient was cleaned and prepped in a sterile fashion with betadine and Lidocaine 2% jelly was instilled into the urethra.  A 14FR cath was inserted no complications were noted , 41ml of urine return was noted, urine was clear and yellow in color. A clean urine sample was collected for u/a and cx. Bladder was drained  And catheter was removed with out difficulty.    Preformed by: Toniann Fail, LPN

## 2015-04-10 LAB — CULTURE, URINE COMPREHENSIVE

## 2015-04-11 ENCOUNTER — Telehealth: Payer: Self-pay | Admitting: *Deleted

## 2015-04-11 NOTE — Telephone Encounter (Signed)
I spoke w/the patient and relayed their UA/Culture results.  The pt indicated understanding and had no questions.

## 2015-04-16 ENCOUNTER — Inpatient Hospital Stay: Payer: Medicare Other | Admitting: Anesthesiology

## 2015-04-16 ENCOUNTER — Inpatient Hospital Stay: Payer: Medicare Other

## 2015-04-16 ENCOUNTER — Inpatient Hospital Stay
Admission: RE | Admit: 2015-04-16 | Discharge: 2015-04-17 | DRG: 983 | Disposition: A | Discharge status: Home / Self Care | Payer: Medicare Other | Source: Ambulatory Visit | Attending: Urology | Admitting: Urology

## 2015-04-16 ENCOUNTER — Encounter
Admission: RE | Disposition: A | Discharge status: Home / Self Care | Payer: Medicare Other | Source: Ambulatory Visit | Attending: Urology

## 2015-04-16 ENCOUNTER — Encounter: Payer: Self-pay | Admitting: *Deleted

## 2015-04-16 DIAGNOSIS — Z9049 Acquired absence of other specified parts of digestive tract: Secondary | ICD-10-CM

## 2015-04-16 DIAGNOSIS — E890 Postprocedural hypothyroidism: Secondary | ICD-10-CM | POA: Diagnosis present

## 2015-04-16 DIAGNOSIS — D1771 Benign lipomatous neoplasm of kidney: Principal | ICD-10-CM | POA: Diagnosis present

## 2015-04-16 DIAGNOSIS — J181 Lobar pneumonia, unspecified organism: Secondary | ICD-10-CM | POA: Diagnosis not present

## 2015-04-16 DIAGNOSIS — D3001 Benign neoplasm of right kidney: Secondary | ICD-10-CM | POA: Diagnosis not present

## 2015-04-16 DIAGNOSIS — I129 Hypertensive chronic kidney disease with stage 1 through stage 4 chronic kidney disease, or unspecified chronic kidney disease: Secondary | ICD-10-CM | POA: Diagnosis not present

## 2015-04-16 DIAGNOSIS — Z87891 Personal history of nicotine dependence: Secondary | ICD-10-CM

## 2015-04-16 DIAGNOSIS — M1990 Unspecified osteoarthritis, unspecified site: Secondary | ICD-10-CM | POA: Diagnosis present

## 2015-04-16 DIAGNOSIS — Z90710 Acquired absence of both cervix and uterus: Secondary | ICD-10-CM

## 2015-04-16 DIAGNOSIS — N189 Chronic kidney disease, unspecified: Secondary | ICD-10-CM | POA: Diagnosis not present

## 2015-04-16 DIAGNOSIS — J939 Pneumothorax, unspecified: Secondary | ICD-10-CM

## 2015-04-16 HISTORY — PX: LAPAROSCOPIC NEPHRECTOMY, HAND ASSISTED: SHX1929

## 2015-04-16 LAB — CBC
HEMATOCRIT: 43.1 % (ref 35.0–47.0)
HEMOGLOBIN: 14.3 g/dL (ref 12.0–16.0)
MCH: 29.1 pg (ref 26.0–34.0)
MCHC: 33.2 g/dL (ref 32.0–36.0)
MCV: 87.7 fL (ref 80.0–100.0)
Platelets: 194 10*3/uL (ref 150–440)
RBC: 4.91 MIL/uL (ref 3.80–5.20)
RDW: 13.7 % (ref 11.5–14.5)
WBC: 11.7 10*3/uL — ABNORMAL HIGH (ref 3.6–11.0)

## 2015-04-16 LAB — CREATININE, SERUM
CREATININE: 1.01 mg/dL — AB (ref 0.44–1.00)
GFR calc Af Amer: >60 mL/min (ref >60)
GFR, EST NON AFRICAN AMERICAN: 54 mL/min — AB (ref >60)

## 2015-04-16 SURGERY — NEPHRECTOMY, HAND-ASSISTED, LAPAROSCOPIC
Anesthesia: General | Laterality: Right | Wound class: Clean Contaminated

## 2015-04-16 MED ORDER — BUPIVACAINE HCL 0.5 % IJ SOLN
INTRAMUSCULAR | Status: DC | PRN
  Administered 2015-04-16: 10 mL

## 2015-04-16 MED ORDER — FENTANYL CITRATE (PF) 100 MCG/2ML IJ SOLN
INTRAMUSCULAR | Status: AC
  Administered 2015-04-16: 25 ug via INTRAVENOUS
  Filled 2015-04-16: qty 2

## 2015-04-16 MED ORDER — ACETAMINOPHEN 10 MG/ML IV SOLN
INTRAVENOUS | Status: DC | PRN
  Administered 2015-04-16: 1000 mg via INTRAVENOUS

## 2015-04-16 MED ORDER — ATORVASTATIN CALCIUM 20 MG PO TABS
20.0000 mg | ORAL_TABLET | Freq: Every day | ORAL | Status: DC
  Administered 2015-04-16: 20 mg via ORAL
  Filled 2015-04-16 (×2): qty 1

## 2015-04-16 MED ORDER — LISINOPRIL 20 MG PO TABS
40.0000 mg | ORAL_TABLET | Freq: Every day | ORAL | Status: DC
  Administered 2015-04-17: 40 mg via ORAL
  Filled 2015-04-16: qty 2

## 2015-04-16 MED ORDER — LACTATED RINGERS IV SOLN
INTRAVENOUS | Status: DC
  Administered 2015-04-16 (×3): via INTRAVENOUS

## 2015-04-16 MED ORDER — MENTHOL 3 MG MT LOZG
1.0000 | LOZENGE | OROMUCOSAL | Status: DC | PRN
  Administered 2015-04-16: 3 mg via ORAL
  Filled 2015-04-16: qty 9

## 2015-04-16 MED ORDER — SODIUM CHLORIDE 0.9 % IJ SOLN
INTRAMUSCULAR | Status: AC
  Filled 2015-04-16: qty 50

## 2015-04-16 MED ORDER — THROMBIN 5000 UNITS EX KIT
PACK | CUTANEOUS | Status: DC | PRN
  Administered 2015-04-16: 5000 [IU] via TOPICAL

## 2015-04-16 MED ORDER — LIDOCAINE HCL (CARDIAC) 20 MG/ML IV SOLN
INTRAVENOUS | Status: DC | PRN
  Administered 2015-04-16: 100 mg via INTRAVENOUS

## 2015-04-16 MED ORDER — SODIUM CHLORIDE 0.9 % IV SOLN
INTRAVENOUS | Status: DC
  Administered 2015-04-16: 23:00:00 via INTRAVENOUS

## 2015-04-16 MED ORDER — ROCURONIUM BROMIDE 100 MG/10ML IV SOLN
INTRAVENOUS | Status: DC | PRN
  Administered 2015-04-16: 50 mg via INTRAVENOUS

## 2015-04-16 MED ORDER — DOCUSATE SODIUM 100 MG PO CAPS: 100.0000 mg | ORAL_CAPSULE | Freq: Two times a day (BID) | ORAL | Status: DC

## 2015-04-16 MED ORDER — FAMOTIDINE 20 MG PO TABS
ORAL_TABLET | ORAL | Status: AC
  Administered 2015-04-16: 20 mg via ORAL
  Filled 2015-04-16: qty 1

## 2015-04-16 MED ORDER — MORPHINE SULFATE (PF) 2 MG/ML IV SOLN
2.0000 mg | INTRAVENOUS | Status: DC | PRN
  Administered 2015-04-16 (×2): 2 mg via INTRAVENOUS
  Filled 2015-04-16 (×3): qty 1

## 2015-04-16 MED ORDER — CEFAZOLIN SODIUM-DEXTROSE 2-3 GM-% IV SOLR
INTRAVENOUS | Status: AC
  Filled 2015-04-16: qty 50

## 2015-04-16 MED ORDER — FENTANYL CITRATE (PF) 100 MCG/2ML IJ SOLN
25.0000 ug | INTRAMUSCULAR | Status: AC | PRN
  Administered 2015-04-16 (×6): 25 ug via INTRAVENOUS

## 2015-04-16 MED ORDER — PROPOFOL 10 MG/ML IV BOLUS
INTRAVENOUS | Status: DC | PRN
  Administered 2015-04-16: 130 mg via INTRAVENOUS

## 2015-04-16 MED ORDER — BUPIVACAINE ON-Q PAIN PUMP (FOR ORDER SET NO CHG): INJECTION | Status: DC

## 2015-04-16 MED ORDER — THROMBIN 5000 UNITS EX SOLR
CUTANEOUS | Status: AC
  Filled 2015-04-16: qty 5000

## 2015-04-16 MED ORDER — LEVOTHYROXINE SODIUM 88 MCG PO TABS
88.0000 ug | ORAL_TABLET | Freq: Every day | ORAL | Status: DC
  Administered 2015-04-17: 88 ug via ORAL
  Filled 2015-04-16 (×2): qty 1

## 2015-04-16 MED ORDER — FAMOTIDINE 20 MG PO TABS
20.0000 mg | ORAL_TABLET | Freq: Once | ORAL | Status: AC
  Administered 2015-04-16: 20 mg via ORAL

## 2015-04-16 MED ORDER — EPHEDRINE SULFATE 50 MG/ML IJ SOLN
INTRAMUSCULAR | Status: DC | PRN
  Administered 2015-04-16: 10 mg via INTRAVENOUS

## 2015-04-16 MED ORDER — ONDANSETRON HCL 4 MG/2ML IJ SOLN: 4.0000 mg | Freq: Once | INTRAMUSCULAR | Status: DC | PRN

## 2015-04-16 MED ORDER — DOCUSATE SODIUM 100 MG PO CAPS
100.0000 mg | ORAL_CAPSULE | Freq: Two times a day (BID) | ORAL | Status: DC
  Administered 2015-04-16 (×2): 100 mg via ORAL
  Filled 2015-04-16 (×3): qty 1

## 2015-04-16 MED ORDER — ONDANSETRON HCL 4 MG/2ML IJ SOLN
INTRAMUSCULAR | Status: DC | PRN
  Administered 2015-04-16: 4 mg via INTRAVENOUS

## 2015-04-16 MED ORDER — ONDANSETRON HCL 4 MG/2ML IJ SOLN: 4.0000 mg | INTRAMUSCULAR | Status: DC | PRN

## 2015-04-16 MED ORDER — CEFAZOLIN SODIUM-DEXTROSE 2-3 GM-% IV SOLR
2.0000 g | Freq: Once | INTRAVENOUS | Status: AC
  Administered 2015-04-16: 2 g via INTRAVENOUS

## 2015-04-16 MED ORDER — BUPIVACAINE 0.5% ON-Q PUMP SINGLE CATH 550 ML - ARMC ONLY
550.0000 mL | INJECTION | Status: DC
  Filled 2015-04-16: qty 550

## 2015-04-16 MED ORDER — HYDROCODONE-ACETAMINOPHEN 5-325 MG PO TABS
1.0000 | ORAL_TABLET | ORAL | Status: DC | PRN
  Administered 2015-04-17: 1 via ORAL
  Filled 2015-04-16: qty 1

## 2015-04-16 MED ORDER — LACTATED RINGERS IV SOLN
INTRAVENOUS | Status: DC
  Administered 2015-04-16: 07:00:00 via INTRAVENOUS

## 2015-04-16 MED ORDER — BUPIVACAINE LIPOSOME 1.3 % IJ SUSP
INTRAMUSCULAR | Status: AC
  Filled 2015-04-16: qty 20

## 2015-04-16 MED ORDER — SODIUM CHLORIDE 0.9 % IV SOLN: INTRAVENOUS | Status: DC

## 2015-04-16 MED ORDER — BUPIVACAINE HCL (PF) 0.5 % IJ SOLN
INTRAMUSCULAR | Status: AC
  Filled 2015-04-16: qty 30

## 2015-04-16 MED ORDER — FENTANYL CITRATE (PF) 100 MCG/2ML IJ SOLN
INTRAMUSCULAR | Status: DC | PRN
  Administered 2015-04-16 (×2): 100 ug via INTRAVENOUS
  Administered 2015-04-16 (×5): 50 ug via INTRAVENOUS

## 2015-04-16 MED ORDER — HYDROCODONE-ACETAMINOPHEN 5-325 MG PO TABS: 1.0000 | ORAL_TABLET | Freq: Four times a day (QID) | ORAL | Status: DC | PRN

## 2015-04-16 MED ORDER — DEXAMETHASONE SODIUM PHOSPHATE 4 MG/ML IJ SOLN
INTRAMUSCULAR | Status: DC | PRN
  Administered 2015-04-16: 5 mg via INTRAVENOUS

## 2015-04-16 MED ORDER — HEPARIN SODIUM (PORCINE) 5000 UNIT/ML IJ SOLN
5000.0000 [IU] | Freq: Three times a day (TID) | INTRAMUSCULAR | Status: DC
  Administered 2015-04-16 – 2015-04-17 (×4): 5000 [IU] via SUBCUTANEOUS
  Filled 2015-04-16 (×4): qty 1

## 2015-04-16 MED ORDER — ACETAMINOPHEN 10 MG/ML IV SOLN
INTRAVENOUS | Status: AC
  Filled 2015-04-16: qty 100

## 2015-04-16 MED ORDER — CEFAZOLIN SODIUM-DEXTROSE 2-3 GM-% IV SOLR
2.0000 g | Freq: Three times a day (TID) | INTRAVENOUS | Status: AC
  Administered 2015-04-16 (×2): 2 g via INTRAVENOUS
  Filled 2015-04-16 (×3): qty 50

## 2015-04-16 MED FILL — Ropivacaine HCl Inj 2 MG/ML: INTRAMUSCULAR | Qty: 600 | Status: AC

## 2015-04-16 SURGICAL SUPPLY — 68 items
ANCHOR TIS RET SYS 1550ML (BAG) ×3 IMPLANT
APPLICATOR SURGIFLO (MISCELLANEOUS) IMPLANT
APPLICATOR SURGIFLO ENDO (HEMOSTASIS) ×3 IMPLANT
APPLIER CLIP ROT 13.4 12 LRG (CLIP) ×3
CANISTER SUCT 1200ML W/VALVE (MISCELLANEOUS) ×3 IMPLANT
CATH KIT ON-Q SILVERSOAK 5IN (CATHETERS) ×3 IMPLANT
CATH TRAY 16F METER LATEX (MISCELLANEOUS) ×3 IMPLANT
CLEANER CAUTERY TIP 5X5 PAD (MISCELLANEOUS) ×1 IMPLANT
CLIP APPLIE ROT 13.4 12 LRG (CLIP) ×1 IMPLANT
CLOSURE WOUND 1/2 X4 (GAUZE/BANDAGES/DRESSINGS) ×1
CORD BIP STRL DISP 12FT (MISCELLANEOUS) ×3 IMPLANT
DEFOGGER SCOPE WARMER CLEARIFY (MISCELLANEOUS) ×3 IMPLANT
DISSECTOR KITTNER STICK (MISCELLANEOUS) ×2 IMPLANT
DISSECTORS/KITTNER STICK (MISCELLANEOUS) ×6
DRAPE INCISE IOBAN 66X45 STRL (DRAPES) ×3 IMPLANT
DRAPE SURG 17X11 SM STRL (DRAPES) ×12 IMPLANT
DRSG TEGADERM 2-3/8X2-3/4 SM (GAUZE/BANDAGES/DRESSINGS) ×9 IMPLANT
DRSG TEGADERM 4X4.75 (GAUZE/BANDAGES/DRESSINGS) ×3 IMPLANT
DRSG TELFA 3X8 NADH (GAUZE/BANDAGES/DRESSINGS) ×3 IMPLANT
GELPORT LAPAROSCOPIC (MISCELLANEOUS) ×3 IMPLANT
GLOVE BIO SURGEON STRL SZ 6.5 (GLOVE) ×2 IMPLANT
GLOVE BIO SURGEONS STRL SZ 6.5 (GLOVE) ×1
GLOVE INDICATOR 6.5 STRL GRN (GLOVE) ×3 IMPLANT
GOWN STRL REUS W/ TWL LRG LVL3 (GOWN DISPOSABLE) ×3 IMPLANT
GOWN STRL REUS W/TWL LRG LVL3 (GOWN DISPOSABLE) ×6
GRASPER SUT TROCAR 14GX15 (MISCELLANEOUS) ×3 IMPLANT
HANDLE YANKAUER SUCT BULB TIP (MISCELLANEOUS) ×3 IMPLANT
IRRIGATION STRYKERFLOW (MISCELLANEOUS) ×1 IMPLANT
IRRIGATOR STRYKERFLOW (MISCELLANEOUS) ×3
IV NS 1000ML (IV SOLUTION) ×2
IV NS 1000ML BAXH (IV SOLUTION) ×1 IMPLANT
KIT RM TURNOVER STRD PROC AR (KITS) ×3 IMPLANT
LABEL OR SOLS (LABEL) ×3 IMPLANT
LIGASURE MARYLAND LAP STAND (ELECTROSURGICAL) ×3 IMPLANT
LOOP RED MAXI  1X406MM (MISCELLANEOUS)
LOOP VESSEL MAXI 1X406 RED (MISCELLANEOUS) IMPLANT
NEEDLE HYPO 25X1 1.5 SAFETY (NEEDLE) ×3 IMPLANT
NEEDLE INSUFFLATION 14GA 120MM (NEEDLE) ×3 IMPLANT
PACK LAP CHOLECYSTECTOMY (MISCELLANEOUS) ×3 IMPLANT
PAD CLEANER CAUTERY TIP 5X5 (MISCELLANEOUS) ×2
PAD GROUND ADULT SPLIT (MISCELLANEOUS) ×3 IMPLANT
PAD TRENDELENBURG OR TABLE (MISCELLANEOUS) ×3 IMPLANT
PENCIL ELECTRO HAND CTR (MISCELLANEOUS) ×3 IMPLANT
RELOAD STAPLE SKIN SM 35W (MISCELLANEOUS) ×3 IMPLANT
SCISSORS METZENBAUM CVD 33 (INSTRUMENTS) ×3 IMPLANT
SHEARS HARMONIC ACE PLUS 36CM (ENDOMECHANICALS) ×3 IMPLANT
SPOGE SURGIFLO 8M (HEMOSTASIS)
SPONGE LAP 18X18 5 PK (GAUZE/BANDAGES/DRESSINGS) ×3 IMPLANT
SPONGE SURGIFLO 8M (HEMOSTASIS) IMPLANT
STAPLE RELOAD 2.5MM WHITE (STAPLE) IMPLANT
STAPLER VASCULAR ECHELON 35 (CUTTER) IMPLANT
STRIP CLOSURE SKIN 1/2X4 (GAUZE/BANDAGES/DRESSINGS) ×2 IMPLANT
SURGILUBE 2OZ TUBE FLIPTOP (MISCELLANEOUS) ×3 IMPLANT
SUT CHROMIC 0 CT 1 (SUTURE) IMPLANT
SUT MNCRL AB 4-0 PS2 18 (SUTURE) IMPLANT
SUT PDS AB 0 CT1 27 (SUTURE) IMPLANT
SUT VIC AB 0 CT1 36 (SUTURE) IMPLANT
SUT VIC AB 1 CT1 36 (SUTURE) IMPLANT
SUT VIC AB 2-0 SH 27 (SUTURE)
SUT VIC AB 2-0 SH 27XBRD (SUTURE) IMPLANT
SUT VIC AB 2-0 UR6 27 (SUTURE) IMPLANT
SUT VIC AB 4-0 FS2 27 (SUTURE) IMPLANT
TROCAR ENDOPATH XCEL 12X100 BL (ENDOMECHANICALS) ×6 IMPLANT
TROCAR XCEL 12X100 BLDLESS (ENDOMECHANICALS) ×3 IMPLANT
TROCAR XCEL NON-BLD 5MMX100MML (ENDOMECHANICALS) ×3 IMPLANT
TUBING INSUFFLATOR HI FLOW (MISCELLANEOUS) ×6 IMPLANT
TUNNLER SHEATHS 12IN 11GA ON-Q (MISCELLANEOUS) ×3 IMPLANT
WATER STERILE IRR 1000ML POUR (IV SOLUTION) ×3 IMPLANT

## 2015-04-16 NOTE — Anesthesia Procedure Notes (Addendum)
Anesthesia Procedure Note Allens ok. Prepped with alcohol, betadine. #22 catheter inseerted without problems. Procedure Name: Intubation Date/Time: 04/16/2015 8:13 AM Performed by: Rosaria Ferries, Doreena Maulden Pre-anesthesia Checklist: Patient identified, Emergency Drugs available, Suction available and Patient being monitored Patient Re-evaluated:Patient Re-evaluated prior to inductionOxygen Delivery Method: Circle system utilized Preoxygenation: Pre-oxygenation with 100% oxygen Intubation Type: IV induction Laryngoscope Size: Mac and 3 Grade View: Grade I Tube type: Oral Number of attempts: 1 Placement Confirmation: ETT inserted through vocal cords under direct vision,  positive ETCO2 and breath sounds checked- equal and bilateral Secured at: 21 cm Tube secured with: Tape Dental Injury: Teeth and Oropharynx as per pre-operative assessment

## 2015-04-16 NOTE — Transfer of Care (Signed)
Immediate Anesthesia Transfer of Care Note  Patient: Rachel Lopez  Procedure(s) Performed: Procedure(s): HAND ASSISTED LAPAROSCOPIC NEPHRECTOMY (Right)  Patient Location: PACU  Anesthesia Type:General  Level of Consciousness: sedated and patient cooperative  Airway & Oxygen Therapy: Patient Spontanous Breathing and Patient connected to nasal cannula oxygen  Post-op Assessment: Report given to RN and Post -op Vital signs reviewed and stable  Post vital signs: Reviewed and stable  Last Vitals:  Filed Vitals:   04/16/15 0651 04/16/15 1215  BP: 115/61 130/70  Pulse: 73 94  Temp: 36.9 C 36.9 C  Resp: 14 15    Complications: No apparent anesthesia complications

## 2015-04-16 NOTE — Progress Notes (Signed)
Dr Erlene Quan at bedside and verified that swelling to right ABD was normal for this pt.

## 2015-04-16 NOTE — Op Note (Signed)
Date of procedure: 04/16/2015  Preoperative diagnosis:  1. Large right angiomyolipoma  Postoperative diagnosis:  1. Large right angiomyolipoma   Procedure: 1. Right hand-assisted laparoscopic nephrectomy  Surgeon:  , MD  Assistant: Ashley Brandon, M.D.  Anesthesia: General  Complications: None  Intraoperative findings: Right angiomyolipoma with successful nephrectomy  EBL: 100 cc  Specimens: Right kidney  Drains: 16 French Foley catheter  Disposition: Stable to the postanesthesia care unit  Indication for procedure: The patient is a 74 y.o. female with a large right angiomyolipoma prevents for surgical resection due to the risk of spontaneous bleeding.  After reviewing the management options for treatment, the patient elected to proceed with the above surgical procedure(s). We have discussed the potential benefits and risks of the procedure, side effects of the proposed treatment, the likelihood of the patient achieving the goals of the procedure, and any potential problems that might occur during the procedure or recuperation. Informed consent has been obtained.  Description of procedure: The patient was met in the preoperative area. All risks, benefits, and indications of the procedure were described in great detail. The patient consented to the procedure. Preoperative antibiotics were given. The patient was taken to the operative theater. General anesthesia was induced per the anesthesia service. A 16 French Foley catheter was then placed. The patient was then placed in the left lateral decubitus/right side up position. All pressure points were padded. Patient was then prepped and draped in usual sterile fashion. A 7 cm incision was then made in the right lower quadrant of the patient's abdomen. Subcutaneous tissues were cauterized with the Bovie. The fascia was then encountered and incised. The muscles were then split. The peritoneum was then visualized. It was incised  with Metzenbaum scissors with great care not to injure underlying structures. A manual palpation within the abdomen felt no adhesions near the incision. A laparoscopic hand port was in place. We did check to ensure that there was no abdominal contents stuck between the abdominal wall and the hand port. The abdomen was then insufflated any trochars placed the hand port. Under direct visualization a 12 mm and 5 mm trochars were placed in the midline supraumbilically. At this point dissection took place. The right colon was mobilized along the white line of Toldt. The kidney was then visualized. We carefully dissected posterior and superiorly. Inferior dissection then revealed the right ureter. This was used to dissect away to the hilum. In the process of identifying the right renal hilum, the duodenum was kocherized. The hilum was eventually dissected from surrounding tissues with great care. Hilum was then taken with a white vascular staple load in 3 pieces. We took branch arteries with the first staple load. Once the remaining hilum was smaller from the first staple ligation, the remaining hilum was taken including remaining arteries and veins in one en bloc staple load. The ureter was also clipped within the staple load. The remaining posterior and superior attachments of the kidney were then dissected free. The kidney was then freed at this point and removed through the hand-assisted port. Of note, we did also put an additional 5 mm lateral port prior to removing the hilum for a liver retractor. After removing the kidney, there are hemostasis was excellent. Upon direct position of the hilum stump, there is no active bleeding. Surgicel was then placed in the resection blood. Again hemostasis is excellent,. 12 mm port in the midline was closed with a Carter Thompson with a 0 Vicryl. The fascia of the hand   port was closed with interrupted 0 Vicryl suture. A 3-0 Vicryl was used to approximate the hand port incision.  4-0 running Monocryl was then used to close the incisions. Dermabond was placed all incisions. Of note, through the upper 5 mm midline incision and an On-Q pump was placed lateral to the incisions. The On-Q pump catheter was 5 mm in size. All incisions were closed Dermabond this point. The patient was woke anesthesia and transferred stable condition post anesthesia care unit.  Plan: The patient will be admitted to the floor. She'll be discharged in 1-2 days. She will follow-up for pathology and postoperative care in 1-2 weeks after discharge.  Baruch Gouty, M.D.

## 2015-04-16 NOTE — H&P (Signed)
@ENCDATE @ 7:52 AM   Rachel Lopez 03-12-1941 [id number]  Referring provider: No referring provider defined for this encounter.  No chief complaint on file.   HPI: 74 y.o. Female with 7.4 cm right renal AML presenting for nephrectomy.     PMH: Past Medical History  Diagnosis Date  . Thyroid disease   . Hypertension   . Hyperlipidemia   . Benign hypertension 10/18/2014  . Hypothyroidism, postablative 10/18/2014  . HLD (hyperlipidemia) 10/18/2014  . Papilloma of breast 10/28/2014  . Renal mass, right 01/01/2015  . Chronic kidney disease   . Arthritis     Surgical History: Past Surgical History  Procedure Laterality Date  . Breast biopsy Left 10/15/2014    papilloma  . Radical hysterectomy  1969  . Total thyroidectomy      age 79  . Appendectomy      48yr  . Hemorrhoid surgery  2014  . Abdominal hysterectomy      Home Medications:    Medication List    ASK your doctor about these medications        atorvastatin 20 MG tablet  Commonly known as:  LIPITOR  Take 1 tablet (20 mg total) by mouth at bedtime.     levothyroxine 88 MCG tablet  Commonly known as:  SYNTHROID, LEVOTHROID  Take 1 tablet (88 mcg total) by mouth daily.     lisinopril 40 MG tablet  Commonly known as:  PRINIVIL,ZESTRIL  Take 1 tablet (40 mg total) by mouth daily.        Allergies: No Known Allergies  Family History: Family History  Problem Relation Age of Onset  . Prostate cancer Neg Hx   . Breast cancer Neg Hx   . Bladder Cancer Neg Hx   . Kidney cancer Neg Hx     Social History:  reports that she quit smoking about 36 years ago. Her smoking use included Cigarettes. She has a 10 pack-year smoking history. She has never used smokeless tobacco. She reports that she does not drink alcohol or use illicit drugs.  ROS:                                        Physical Exam: BP 115/61 mmHg  Pulse 73  Temp(Src) 98.5 F (36.9 C) (Oral)  Resp 14  Ht 5\' 4"   (1.626 m)  Wt 149 lb (67.586 kg)  BMI 25.56 kg/m2  SpO2 98%  Constitutional:  Alert and oriented, No acute distress. HEENT: [place] AT, moist mucus membranes.  Trachea midline, no masses. Cardiovascular: No clubbing, cyanosis, or edema. Respiratory: Normal respiratory effort, no increased work of breathing. GI: Abdomen is soft, nontender, nondistended, no abdominal masses GU: No CVA tenderness.  Skin: No rashes, bruises or suspicious lesions. Lymph: No cervical or inguinal adenopathy. Neurologic: Grossly intact, no focal deficits, moving all 4 extremities. Psychiatric: Normal mood and affect.  Laboratory Data: Lab Results  Component Value Date   WBC 5.5 04/03/2015   HGB 14.8 04/03/2015   HCT 44.1 04/03/2015   MCV 86.8 04/03/2015   PLT 228 04/03/2015    Lab Results  Component Value Date   CREATININE 0.81 04/03/2015    No results found for: PSA  No results found for: TESTOSTERONE  No results found for: HGBA1C  Urinalysis    Component Value Date/Time   COLORURINE STRAW* 04/03/2015 0906   APPEARANCEUR CLEAR* 04/03/2015 [id number]  LABSPEC 1.003* 04/03/2015 0906   PHURINE 6.0 04/03/2015 0906   GLUCOSEU Negative 04/08/2015 1113   HGBUR NEGATIVE 04/03/2015 0906   BILIRUBINUR Negative 04/08/2015 1113   BILIRUBINUR NEGATIVE 04/03/2015 0906   BILIRUBINUR neg 03/04/2015 1112   KETONESUR NEGATIVE 04/03/2015 0906   PROTEINUR NEGATIVE 04/03/2015 0906   PROTEINUR neg 03/04/2015 1112   UROBILINOGEN 0.2 03/04/2015 1112   NITRITE Negative 04/08/2015 1113   NITRITE NEGATIVE 04/03/2015 0906   NITRITE neg 03/04/2015 1112   LEUKOCYTESUR Negative 04/08/2015 1113   LEUKOCYTESUR NEGATIVE 04/03/2015 0906     Assessment & Plan:  74 y.o. Female with 7.4 cm right renal AML presenting for right hand assist nephrectomy.    Nickie Retort, MD  [facility] [address], [place], [place] [address] [phone]

## 2015-04-16 NOTE — Progress Notes (Signed)
Naropin 0.2%/ropivacaine 564ml ON-Q pump started at 92ml/hr.

## 2015-04-16 NOTE — Anesthesia Preprocedure Evaluation (Addendum)
Anesthesia Evaluation  Patient identified by MRN, date of birth, ID band Patient awake    Reviewed: Allergy & Precautions, NPO status , Patient's Chart, lab work & pertinent test results, reviewed documented beta blocker date and time   Airway Mallampati: II  TM Distance: >3 FB     Dental  (+) Chipped, Upper Dentures   Pulmonary former smoker,           Cardiovascular hypertension, Pt. on medications      Neuro/Psych    GI/Hepatic   Endo/Other  Hypothyroidism   Renal/GU Renal InsufficiencyRenal disease     Musculoskeletal  (+) Arthritis ,   Abdominal   Peds  Hematology   Anesthesia Other Findings Allens test ok. EKG ok. Cardiac eval done. Fixed upper dentures.  Reproductive/Obstetrics                            Anesthesia Physical Anesthesia Plan  ASA: III  Anesthesia Plan: General   Post-op Pain Management:    Induction: Intravenous  Airway Management Planned: Oral ETT  Additional Equipment:   Intra-op Plan:   Post-operative Plan:   Informed Consent: I have reviewed the patients History and Physical, chart, labs and discussed the procedure including the risks, benefits and alternatives for the proposed anesthesia with the patient or authorized representative who has indicated his/her understanding and acceptance.     Plan Discussed with: CRNA  Anesthesia Plan Comments:         Anesthesia Quick Evaluation

## 2015-04-17 ENCOUNTER — Telehealth: Payer: Self-pay

## 2015-04-17 LAB — CBC
HEMATOCRIT: 35.3 % (ref 35.0–47.0)
HEMOGLOBIN: 12.2 g/dL (ref 12.0–16.0)
MCH: 29.1 pg (ref 26.0–34.0)
MCHC: 34.6 g/dL (ref 32.0–36.0)
MCV: 84.1 fL (ref 80.0–100.0)
Platelets: 197 10*3/uL (ref 150–440)
RBC: 4.2 MIL/uL (ref 3.80–5.20)
RDW: 13.5 % (ref 11.5–14.5)
WBC: 8.1 10*3/uL (ref 3.6–11.0)

## 2015-04-17 LAB — BASIC METABOLIC PANEL
ANION GAP: 5 (ref 5–15)
BUN: 19 mg/dL (ref 6–20)
CALCIUM: 8.3 mg/dL — AB (ref 8.9–10.3)
CHLORIDE: 109 mmol/L (ref 101–111)
CO2: 27 mmol/L (ref 22–32)
Creatinine, Ser: 1.5 mg/dL — ABNORMAL HIGH (ref 0.44–1.00)
GFR calc non Af Amer: 33 mL/min — ABNORMAL LOW (ref >60)
GFR, EST AFRICAN AMERICAN: 39 mL/min — AB (ref >60)
Glucose, Bld: 116 mg/dL — ABNORMAL HIGH (ref 65–99)
POTASSIUM: 4.1 mmol/L (ref 3.5–5.1)
Sodium: 141 mmol/L (ref 135–145)

## 2015-04-17 NOTE — Progress Notes (Signed)
Urology Consult Follow Up  Subjective: Patient is alert and in no acute distress. She is resting comfortably. She reports that her pain is much improved and is very minimal at this time. She is tolerating by mouth liquids well. She is requesting that a Foley catheter and saline locks be removed.  Anti-infectives: Anti-infectives    Start     Dose/Rate Route Frequency Ordered Stop   04/16/15 1400  ceFAZolin (ANCEF) IVPB 2 g/50 mL premix     2 g 100 mL/hr over 30 Minutes Intravenous Every 8 hours 04/16/15 1359 04/16/15 2246   04/16/15 0700  ceFAZolin (ANCEF) IVPB 2 g/50 mL premix     2 g 100 mL/hr over 30 Minutes Intravenous  Once 04/16/15 0658 04/16/15 0903   04/16/15 0602  ceFAZolin (ANCEF) 2-3 GM-% IVPB SOLR    Comments:  Ronnell Freshwater: cabinet override      04/16/15 0602 04/16/15 1814      Current Facility-Administered Medications  Medication Dose Route Frequency Provider Last Rate Last Dose  . 0.9 %  sodium chloride infusion   Intravenous Continuous Nickie Retort, MD 100 mL/hr at 04/16/15 2303    . atorvastatin (LIPITOR) tablet 20 mg  20 mg Oral QHS Nickie Retort, MD   20 mg at 04/16/15 2221  . docusate sodium (COLACE) capsule 100 mg  100 mg Oral BID Nickie Retort, MD   100 mg at 04/16/15 2222  . heparin injection 5,000 Units  5,000 Units Subcutaneous 3 times per day Nickie Retort, MD   5,000 Units at 04/17/15 0529  . HYDROcodone-acetaminophen (NORCO/VICODIN) 5-325 MG per tablet 1-2 tablet  1-2 tablet Oral Q4H PRN Nickie Retort, MD      . levothyroxine (SYNTHROID, LEVOTHROID) tablet 88 mcg  88 mcg Oral QAC breakfast Nickie Retort, MD   88 mcg at 04/17/15 0732  . lisinopril (PRINIVIL,ZESTRIL) tablet 40 mg  40 mg Oral Daily Nickie Retort, MD      . menthol-cetylpyridinium (CEPACOL) lozenge 3 mg  1 lozenge Oral PRN Nickie Retort, MD   3 mg at 04/16/15 1615  . morphine 2 MG/ML injection 2-4 mg  2-4 mg Intravenous Q2H PRN Nickie Retort, MD   2  mg at 04/16/15 1908  . ondansetron (ZOFRAN) injection 4 mg  4 mg Intravenous Q4H PRN Nickie Retort, MD         Objective: Vital signs in last 24 hours: Temp:  [97.4 F (36.3 C)-99.5 F (37.5 C)] 98.2 F (36.8 C) (12/29 0708) Pulse Rate:  [69-94] 73 (12/29 0708) Resp:  [12-20] 18 (12/29 0708) BP: (104-135)/(51-71) 109/54 mmHg (12/29 0708) SpO2:  [95 %-100 %] 95 % (12/29 0708) Arterial Line BP: (134-137)/(63-67) 134/63 mmHg (12/28 1248)  Intake/Output from previous day: 12/28 0701 - 12/29 0700 In: 2810 [I.V.:2810] Out: 2500 [Urine:1800; Emesis/NG output:600; Blood:100] Intake/Output this shift: Total I/O In: -  Out: 125 [Urine:125]   Physical Exam  Constitutional: She is oriented to person, place, and time and well-developed, well-nourished, and in no distress.  HENT:  Head: Normocephalic.  Eyes: Pupils are equal, round, and reactive to light.  Neck: Normal range of motion. Neck supple.  Cardiovascular: Intact distal pulses.   Pulmonary/Chest: Effort normal.  Abdominal: Soft. There is tenderness.   surgical incisions without drainage, mild surrounding ecchymosis, no erythema or fluctuance, catheter in abdomen infusing anesthetic    Neurological: She is alert and oriented to person, place, and time.  Skin: Skin is warm and  dry.  Psychiatric: Mood and affect normal.    Lab Results:   Recent Labs  04/16/15 1436 04/17/15 0448  WBC 11.7* 8.1  HGB 14.3 12.2  HCT 43.1 35.3  PLT 194 197   BMET  Recent Labs  04/16/15 1436 04/17/15 0448  NA  --  141  K  --  4.1  CL  --  109  CO2  --  27  GLUCOSE  --  116*  BUN  --  19  CREATININE 1.01* 1.50*  CALCIUM  --  8.3*   PT/INR No results for input(s): LABPROT, INR in the last 72 hours. ABG No results for input(s): PHART, HCO3 in the last 72 hours.  Invalid input(s): PCO2, PO2  Studies/Results: Dg Chest Port 1 View  04/16/2015  CLINICAL DATA:  Status post right nephrectomy. EXAM: PORTABLE CHEST 1 VIEW  COMPARISON:  None. FINDINGS: There is pneumoperitoneum consistent with recent surgery. There is no demonstrable pneumothorax. There is soft tissue air over the right lateral abdomen and lower chest. There is focal consolidation in the left base with small left effusion. There is minimal atelectasis in the right base. Heart is borderline prominent with pulmonary vascularity within normal limits. No adenopathy. IMPRESSION: Pneumoperitoneum with air in the lateral right abdomen consistent with recent surgery. No pneumothorax. Mild consolidation left base with small left effusion. Slight atelectasis right base. Electronically Signed   By: Lowella Grip III M.D.   On: 04/16/2015 13:18    Assessment: s/p Procedure(s): POD 1 HAND ASSISTED LAPAROSCOPIC NEPHRECTOMY Patient has had an uneventful postoperative course. She is feeling well today. Minimal pain. VSS. CBC WNL.  Slight increase in Cr from 1.01 to 1.5.   Plan: -D/C Foley and saline lock -regular diet -up ad lib -Plan for discharge, Dr. Erlene Quan to see patient this afternoon CC:     LOS: 1 day    Herbert Moors 04/17/2015

## 2015-04-17 NOTE — Anesthesia Postprocedure Evaluation (Signed)
Anesthesia Post Note  Patient: Rachel Lopez  Procedure(s) Performed: Procedure(s) (LRB): HAND ASSISTED LAPAROSCOPIC NEPHRECTOMY (Right)  Patient location during evaluation: PACU Anesthesia Type: General Level of consciousness: awake Pain management: pain level controlled Vital Signs Assessment: post-procedure vital signs reviewed and stable Respiratory status: spontaneous breathing Cardiovascular status: blood pressure returned to baseline Anesthetic complications: no    Last Vitals:  Filed Vitals:   04/17/15 1018 04/17/15 1210  BP: 124/58   Pulse: 88   Temp:  36.8 C  Resp:      Last Pain:  Filed Vitals:   04/17/15 1210  PainSc: [place]

## 2015-04-17 NOTE — Discharge Summary (Signed)
Date of admission: 04/16/2015  Date of discharge: 04/17/2015  Admission diagnosis: Right AML renal mass  Discharge diagnosis: same  Secondary diagnoses:  Patient Active Problem List   Diagnosis Date Noted  . Preoperative cardiovascular examination 03/27/2015  . Angiomyolipoma of kidney 01/01/2015  . Papilloma of breast 10/28/2014  . HLD (hyperlipidemia) 10/18/2014  . Benign hypertension 10/18/2014  . Hypothyroidism, postablative 10/18/2014  . Intraductal papilloma of left breast 10/18/2014    History and Physical: For full details, please see admission history and physical. Briefly, KEM PARCHER is a 74 y.o. year old patient with large right AML.   Hospital Course: Patient tolerated the procedure well.  She was then transferred to the floor after an uneventful PACU stay.  Her hospital course was uncomplicated.  On POD#1  she had met discharge criteria: was eating a regular diet, was up and ambulating independently,  pain was well controlled, was voiding without a catheter, and was ready to for discharge.   Laboratory values:   Recent Labs  04/16/15 1436 04/17/15 0448  WBC 11.7* 8.1  HGB 14.3 12.2  HCT 43.1 35.3    Recent Labs  04/16/15 1436 04/17/15 0448  NA  --  141  K  --  4.1  CL  --  109  CO2  --  27  GLUCOSE  --  116*  BUN  --  19  CREATININE 1.01* 1.50*  CALCIUM  --  8.3*   No results for input(s): LABPT, INR in the last 72 hours. No results for input(s): LABURIN in the last 72 hours. Results for orders placed or performed in visit on 04/08/15  Microscopic Examination     Status: None   Collection Time: 04/08/15 11:13 AM  Result Value Ref Range Status   WBC, UA None seen 0 -  5 /hpf Final   RBC, UA None seen 0 -  2 /hpf Final   Epithelial Cells (non renal) None seen 0 - 10 /hpf Final   Bacteria, UA None seen None seen/Few Final  CULTURE, URINE COMPREHENSIVE     Status: None   Collection Time: 04/08/15 11:14 AM  Result Value Ref Range Status   Urine Culture, Comprehensive Final report  Final   Result 1 Comment  Final    Comment: No growth in 36 - 48 hours.    Disposition: Home  Discharge instruction: The patient was instructed to be ambulatory but told to refrain from heavy lifting, strenuous activity, or driving.  Discharge medications:   Medication List    TAKE these medications        atorvastatin 20 MG tablet  Commonly known as:  LIPITOR  Take 1 tablet (20 mg total) by mouth at bedtime.     docusate sodium 100 MG capsule  Commonly known as:  COLACE  Take 1 capsule (100 mg total) by mouth 2 (two) times daily.     HYDROcodone-acetaminophen 5-325 MG tablet  Commonly known as:  NORCO  Take 1 tablet by mouth every 6 (six) hours as needed for moderate pain.     levothyroxine 88 MCG tablet  Commonly known as:  SYNTHROID, LEVOTHROID  Take 1 tablet (88 mcg total) by mouth daily.     lisinopril 40 MG tablet  Commonly known as:  PRINIVIL,ZESTRIL  Take 1 tablet (40 mg total) by mouth daily.        Followup:      Follow-up Information    Follow up with Nickie Retort, MD In 2 weeks.  Specialty:  Urology   Contact information:   [address] [place] [address] [phone]

## 2015-04-17 NOTE — Telephone Encounter (Signed)
-----   Message from Nickie Retort, MD sent at 04/16/2015 12:35 PM EST ----- Rachel Lopez will need follow up in 2 weeks for postop check. Surgery was today. She is being admitted to hospital for 1-2 days

## 2015-04-17 NOTE — Progress Notes (Signed)
Patient discharged home. Discharge instructions, prescriptions and follow up appointment given to and reviewed with patient. Patient verbalized understanding. Escorted out via wheelchair by Perley Jain, RN.

## 2015-04-18 LAB — SURGICAL PATHOLOGY

## 2015-04-18 NOTE — Telephone Encounter (Signed)
Done ° ° °Rachel Lopez °

## 2015-04-20 HISTORY — PX: BREAST EXCISIONAL BIOPSY: SUR124

## 2015-04-21 MED FILL — Thrombin For Soln 5000 Unit: CUTANEOUS | Qty: 5000 | Status: AC

## 2015-05-01 ENCOUNTER — Encounter: Payer: Self-pay | Admitting: Urology

## 2015-05-01 ENCOUNTER — Ambulatory Visit (INDEPENDENT_AMBULATORY_CARE_PROVIDER_SITE_OTHER): Payer: Medicare Other | Admitting: Urology

## 2015-05-01 VITALS — BP 94/63 | HR 102 | Ht 64.0 in | Wt 142.9 lb

## 2015-05-01 DIAGNOSIS — D3001 Benign neoplasm of right kidney: Secondary | ICD-10-CM

## 2015-05-01 LAB — URINALYSIS, COMPLETE
BILIRUBIN UA: NEGATIVE
Glucose, UA: NEGATIVE
Ketones, UA: NEGATIVE
Leukocytes, UA: NEGATIVE
NITRITE UA: NEGATIVE
PH UA: 5.5 (ref 5.0–7.5)
PROTEIN UA: NEGATIVE
RBC UA: NEGATIVE
Specific Gravity, UA: 1.01 (ref 1.005–1.030)
UUROB: 0.2 mg/dL (ref 0.2–1.0)

## 2015-05-01 LAB — MICROSCOPIC EXAMINATION
RBC MICROSCOPIC, UA: NONE SEEN /HPF (ref 0–?)
WBC UA: NONE SEEN /HPF (ref 0–?)

## 2015-05-01 NOTE — Progress Notes (Signed)
05/01/2015 9:44 AM   Rachel Lopez [birth year] [id number]  Referring provider: Glean Hess, MD [address] [place], [place] [address]  Chief Complaint  Patient presents with  . Follow-up    LAPAROSCOPIC NEPHRECTOMY    HPI: The patient is a 75 year old female who presents for follow-up after undergoing a right laparoscopic nephrectomy for a 7.7 cm angiomyolipoma.  She has done well since surgery. She is having no pain. She is tolerating diet and having bowel movements.  Pathology: A. RIGHT KIDNEY; RIGHT NEPHRECTOMY:  - CLASSIC ANGIOMYOLIPOMA WITH FOCAL MILD ATYPIA (7.7 CM).  - THE SURGICAL MARGINS ARE NEGATIVE.   PMH: Past Medical History  Diagnosis Date  . Thyroid disease   . Hypertension   . Hyperlipidemia   . Benign hypertension 10/18/2014  . Hypothyroidism, postablative 10/18/2014  . HLD (hyperlipidemia) 10/18/2014  . Papilloma of breast 10/28/2014  . Renal mass, right 01/01/2015  . Chronic kidney disease   . Arthritis     Surgical History: Past Surgical History  Procedure Laterality Date  . Breast biopsy Left 10/15/2014    papilloma  . Radical hysterectomy  1969  . Total thyroidectomy      age 55  . Appendectomy      7yr  . Hemorrhoid surgery  2014  . Abdominal hysterectomy    . Laparoscopic nephrectomy, hand assisted Right 04/16/2015    Procedure: HAND ASSISTED LAPAROSCOPIC NEPHRECTOMY;  Surgeon: Nickie Retort, MD;  Location: [facility] ORS;  Service: Urology;  Laterality: Right;    Home Medications:    Medication List       This list is accurate as of: 05/01/15  9:44 AM.  Always use your most recent med list.               atorvastatin 20 MG tablet  Commonly known as:  LIPITOR  Take 1 tablet (20 mg total) by mouth at bedtime.     docusate sodium 100 MG capsule  Commonly known as:  COLACE  Take 1 capsule (100 mg total) by mouth 2 (two) times daily.     HYDROcodone-acetaminophen 5-325 MG tablet  Commonly known as:  NORCO  Take 1  tablet by mouth every 6 (six) hours as needed for moderate pain.     levothyroxine 88 MCG tablet  Commonly known as:  SYNTHROID, LEVOTHROID  Take 1 tablet (88 mcg total) by mouth daily.     lisinopril 40 MG tablet  Commonly known as:  PRINIVIL,ZESTRIL  Take 1 tablet (40 mg total) by mouth daily.        Allergies: No Known Allergies  Family History: Family History  Problem Relation Age of Onset  . Prostate cancer Neg Hx   . Breast cancer Neg Hx   . Bladder Cancer Neg Hx   . Kidney cancer Neg Hx     Social History:  reports that she quit smoking about 36 years ago. Her smoking use included Cigarettes. She has a 10 pack-year smoking history. She has never used smokeless tobacco. She reports that she does not drink alcohol or use illicit drugs.  ROS: UROLOGY Frequent Urination?: No Hard to postpone urination?: No Burning/pain with urination?: No Get up at night to urinate?: No Leakage of urine?: No Urine stream starts and stops?: No Trouble starting stream?: No Do you have to strain to urinate?: No Blood in urine?: No Urinary tract infection?: No Sexually transmitted disease?: No Injury to kidneys or bladder?: No Painful intercourse?: No Weak stream?: No  Currently pregnant?: No Vaginal bleeding?: No Last menstrual period?: hysterectomy  Gastrointestinal Nausea?: No Vomiting?: No Indigestion/heartburn?: No Diarrhea?: No Constipation?: No  Constitutional Fever: No Night sweats?: No Weight loss?: No Fatigue?: No  Skin Skin rash/lesions?: No Itching?: No  Eyes Blurred vision?: No Double vision?: No  Ears/Nose/Throat Sore throat?: No Sinus problems?: No  Hematologic/Lymphatic Swollen glands?: No Easy bruising?: No  Cardiovascular Leg swelling?: No Chest pain?: No  Respiratory Cough?: No Shortness of breath?: No  Endocrine Excessive thirst?: No  Musculoskeletal Back pain?: No Joint pain?: No  Neurological Headaches?: No Dizziness?:  No  Psychologic Depression?: No Anxiety?: No  Physical Exam: BP 94/63 mmHg  Pulse 102  Ht 5\' 4"  (1.626 m)  Wt 142 lb 14.4 oz (64.819 kg)  BMI 24.52 kg/m2  Constitutional:  Alert and oriented, No acute distress. HEENT: [place] AT, moist mucus membranes.  Trachea midline, no masses. Cardiovascular: No clubbing, cyanosis, or edema. Respiratory: Normal respiratory effort, no increased work of breathing. GI: Abdomen is soft, nontender, nondistended, no abdominal masses GU: No CVA tenderness. Incisions healing well. No herniation. Skin: No rashes, bruises or suspicious lesions. Lymph: No cervical or inguinal adenopathy. Neurologic: Grossly intact, no focal deficits, moving all 4 extremities. Psychiatric: Normal mood and affect.  Laboratory Data: Lab Results  Component Value Date   WBC 8.1 04/17/2015   HGB 12.2 04/17/2015   HCT 35.3 04/17/2015   MCV 84.1 04/17/2015   PLT 197 04/17/2015    Lab Results  Component Value Date   CREATININE 1.50* 04/17/2015    No results found for: PSA  No results found for: TESTOSTERONE  No results found for: HGBA1C  Urinalysis    Component Value Date/Time   COLORURINE STRAW* 04/03/2015 0906   APPEARANCEUR CLEAR* 04/03/2015 0906   LABSPEC 1.003* 04/03/2015 0906   PHURINE 6.0 04/03/2015 0906   GLUCOSEU Negative 04/08/2015 1113   HGBUR NEGATIVE 04/03/2015 0906   BILIRUBINUR Negative 04/08/2015 1113   BILIRUBINUR NEGATIVE 04/03/2015 0906   BILIRUBINUR neg 03/04/2015 1112   KETONESUR NEGATIVE 04/03/2015 0906   PROTEINUR NEGATIVE 04/03/2015 0906   PROTEINUR neg 03/04/2015 1112   UROBILINOGEN 0.2 03/04/2015 1112   NITRITE Negative 04/08/2015 1113   NITRITE NEGATIVE 04/03/2015 0906   NITRITE neg 03/04/2015 1112   LEUKOCYTESUR Negative 04/08/2015 1113   LEUKOCYTESUR NEGATIVE 04/03/2015 0906      Assessment & Plan:    1. Right angiomyolipoma s/p right nephrectomy The patient needs no further urological workup at this time. I advised her  to maintain a healthy diet as she now only has one kidney. She will need to have her creatinine closely monitored which can be done at her annual physical with her primary care doctor. She can follow-up with [place] as needed.  Return if symptoms worsen or fail to improve.  Nickie Retort, MD  [facility] [address], [place], [place] [address] [phone]

## 2015-05-14 ENCOUNTER — Encounter: Payer: Self-pay | Admitting: General Surgery

## 2015-05-14 ENCOUNTER — Ambulatory Visit: Payer: Medicare Other

## 2015-05-14 ENCOUNTER — Ambulatory Visit (INDEPENDENT_AMBULATORY_CARE_PROVIDER_SITE_OTHER): Payer: Medicare Other | Admitting: General Surgery

## 2015-05-14 VITALS — BP 128/62 | HR 78 | Resp 12 | Ht 64.0 in | Wt 143.0 lb

## 2015-05-14 DIAGNOSIS — N63 Unspecified lump in breast: Secondary | ICD-10-CM

## 2015-05-14 DIAGNOSIS — N6320 Unspecified lump in the left breast, unspecified quadrant: Secondary | ICD-10-CM

## 2015-05-14 DIAGNOSIS — N6012 Diffuse cystic mastopathy of left breast: Secondary | ICD-10-CM | POA: Diagnosis not present

## 2015-05-14 DIAGNOSIS — D242 Benign neoplasm of left breast: Secondary | ICD-10-CM

## 2015-05-14 MED ORDER — HYDROCODONE-ACETAMINOPHEN 5-325 MG PO TABS: 1.0000 | ORAL_TABLET | ORAL | Status: DC | PRN

## 2015-05-14 NOTE — Progress Notes (Signed)
Patient ID: EMMELINE SHINE, female   DOB: 11-23-1940, 75 y.o.   MRN: [id number]  Chief Complaint  Patient presents with  . Procedure    left breast excision    HPI HAWI BUTLER is a 75 y.o. female here today for a left breast excision. The patient had previously undergone core biopsy of a an intraductal papilloma in June 2016. Serial imaging shows increasing size and formal excision has been recommended.  The patient did well with her recent renal surgery for an angiomyolipoma.  I personally reviewed the patient's history.     HPI  Past Medical History  Diagnosis Date  . Thyroid disease   . Hypertension   . Hyperlipidemia   . Benign hypertension 10/18/2014  . Hypothyroidism, postablative 10/18/2014  . HLD (hyperlipidemia) 10/18/2014  . Papilloma of breast 10/28/2014  . Renal mass, right 01/01/2015  . Chronic kidney disease   . Arthritis     Past Surgical History  Procedure Laterality Date  . Breast biopsy Left 10/15/2014    papilloma  . Radical hysterectomy  1969  . Total thyroidectomy      age 77  . Appendectomy      33yr  . Hemorrhoid surgery  2014  . Abdominal hysterectomy    . Laparoscopic nephrectomy, hand assisted Right 04/16/2015    Procedure: HAND ASSISTED LAPAROSCOPIC NEPHRECTOMY;  Surgeon: Nickie Retort, MD;  Location: [facility] ORS;  Service: Urology;  Laterality: Right;    Family History  Problem Relation Age of Onset  . Prostate cancer Neg Hx   . Breast cancer Neg Hx   . Bladder Cancer Neg Hx   . Kidney cancer Neg Hx     Social History Social History  Substance Use Topics  . Smoking status: Former Smoker -- 0.50 packs/day for 20 years    Types: Cigarettes    Quit date: 02/18/1979  . Smokeless tobacco: Never Used  . Alcohol Use: No    No Known Allergies  Current Outpatient Prescriptions  Medication Sig Dispense Refill  . atorvastatin (LIPITOR) 20 MG tablet Take 1 tablet (20 mg total) by mouth at bedtime. 30 tablet 5  . docusate sodium  (COLACE) 100 MG capsule Take 1 capsule (100 mg total) by mouth 2 (two) times daily. 10 capsule 0  . HYDROcodone-acetaminophen (NORCO) 5-325 MG tablet Take 1 tablet by mouth every 6 (six) hours as needed for moderate pain. 30 tablet 0  . levothyroxine (SYNTHROID, LEVOTHROID) 88 MCG tablet Take 1 tablet (88 mcg total) by mouth daily. 90 tablet 3  . lisinopril (PRINIVIL,ZESTRIL) 40 MG tablet Take 1 tablet (40 mg total) by mouth daily. 90 tablet 3  . HYDROcodone-acetaminophen (NORCO) 5-325 MG tablet Take 1-2 tablets by mouth every 4 (four) hours as needed for moderate pain. 30 tablet 0   No current facility-administered medications for this visit.    Review of Systems Review of Systems  Constitutional: Negative.   Respiratory: Negative.   Cardiovascular: Negative.     Blood pressure 128/62, pulse 78, resp. rate 12, height 5\' 4"  (1.626 m), weight 143 lb (64.864 kg).  Physical Exam Physical Exam  Constitutional: She is oriented to person, place, and time. She appears well-developed and well-nourished.  Pulmonary/Chest:    Neurological: She is alert and oriented to person, place, and time.  Skin: Skin is warm and dry.   Short suture below nipple and long laternal Data Reviewed   Ultrasound was completed to confirm the extent of the papilloma and dilatated ductal work.  At the 3:00 position, 1 cm from the nipple the papilloma measures up to 4 mm in diameter. The associated dilated duct measured at 1.4 cm in one section extends inferior to the dominant mass. BI-RADS-3.  Assessment    Intraductal papilloma with increasing ductal prominence, palpable mass.    Plan    Formal excision was discussed and the indications for excision reviewed. The patient was amenable to proceed. The site was confirmed by palpation and ultrasound.  Alcohol was applied to the skin followed by 20 mL of 0.5% Xylocaine with 0.25% Marcaine with 1-200,000 units of epinephrine. ChloraPrep was applied to the skin  and the area draped. A radial incision was made from the base of the nipple across the edge of the areola into the adjacent breast skin. Minimal bleeding from previous local infiltration. The dilated duct was followed up to the base of the nipple where it was transected and a 2 x 3 x 3 cm block of tissue removed, orientated with the short suture at the tissue below the nipple and a long suture at the lateral edge of the rest parenchyma removed. An additional 1/2 cm diameter mass deep to the original specimen was then removed as a contained additional dilated ductal tissue. This was not orientated. The wound was closed in layers with interrupted 3-0 Vicryl figure-of-eight sutures to the parenchyma in multiple layers. The skin was closed with a running 4-0 Vicryl subcuticular suture. Benzoin, Steri-Strips, Telfa dressing applied. Fluff gauze followed by a Kerlix and Ace wrap compressive bandage applied. The procedure was well tolerated.  The patient was provided with written instructions regarding wound care. She'll remove her compressive wrap in 2 days and may shower at that time.  We will plan on office follow-up in 7-10 days.   Patient to return in one week. PCP:  Army Melia This information has been scribed by Karie Fetch RNBC.    Robert Bellow 05/15/2015, 6:08 AM

## 2015-05-14 NOTE — Patient Instructions (Signed)
Patient to return in one week. 

## 2015-05-15 ENCOUNTER — Ambulatory Visit: Payer: Medicare Other | Admitting: Cardiovascular Disease

## 2015-05-16 ENCOUNTER — Encounter: Payer: Self-pay | Admitting: Internal Medicine

## 2015-05-16 ENCOUNTER — Telehealth: Payer: Self-pay

## 2015-05-16 NOTE — Telephone Encounter (Signed)
-----   Message from Robert Bellow, MD sent at 05/16/2015 12:40 PM EST ----- Please notify the path is fine.  F/U 2/2 as scheduled. Thanks.  ----- Message -----    From: Lab in [facility] Interface    Sent: 05/16/2015  11:14 AM      To: Robert Bellow, MD

## 2015-05-16 NOTE — Telephone Encounter (Signed)
Message left for patient to call back for results.

## 2015-05-19 NOTE — Telephone Encounter (Signed)
Notified patient as instructed, patient pleased. Discussed follow-up appointments, patient agrees  

## 2015-05-22 ENCOUNTER — Ambulatory Visit: Payer: Medicare Other | Admitting: General Surgery

## 2015-05-22 ENCOUNTER — Encounter: Payer: Self-pay | Admitting: General Surgery

## 2015-05-22 ENCOUNTER — Ambulatory Visit (INDEPENDENT_AMBULATORY_CARE_PROVIDER_SITE_OTHER): Payer: Medicare Other | Admitting: General Surgery

## 2015-05-22 VITALS — BP 122/70 | HR 74 | Resp 12 | Ht 64.0 in | Wt 144.0 lb

## 2015-05-22 DIAGNOSIS — N6320 Unspecified lump in the left breast, unspecified quadrant: Secondary | ICD-10-CM

## 2015-05-22 DIAGNOSIS — N63 Unspecified lump in breast: Secondary | ICD-10-CM

## 2015-05-22 DIAGNOSIS — D242 Benign neoplasm of left breast: Secondary | ICD-10-CM

## 2015-05-22 NOTE — Patient Instructions (Signed)
Patient to return in one month. 

## 2015-05-22 NOTE — Progress Notes (Signed)
Patient ID: Rachel Lopez, female   DOB: Sep 04, 1940, 75 y.o.   MRN: [id number]  Chief Complaint  Patient presents with  . Routine Post Op    left breast excision    HPI Rachel Lopez is a 75 y.o. female here today for a left breast excision mass was removed on 05/14/15. Patient states she is doing well.    I personally reviewed the patient's history.   HPI  Past Medical History  Diagnosis Date  . Thyroid disease   . Hypertension   . Hyperlipidemia   . Benign hypertension 10/18/2014  . Hypothyroidism, postablative 10/18/2014  . HLD (hyperlipidemia) 10/18/2014  . Papilloma of breast 10/28/2014  . Renal mass, right 01/01/2015  . Chronic kidney disease   . Arthritis     Past Surgical History  Procedure Laterality Date  . Breast biopsy Left 10/15/2014    papilloma  . Radical hysterectomy  1969  . Total thyroidectomy      age 85  . Appendectomy      70yr  . Hemorrhoid surgery  2014  . Abdominal hysterectomy    . Laparoscopic nephrectomy, hand assisted Right 04/16/2015    Procedure: HAND ASSISTED LAPAROSCOPIC NEPHRECTOMY;  Surgeon: Nickie Retort, MD;  Location: [facility] ORS;  Service: Urology;  Laterality: Right;  . Breast lumpectomy Left 04/2015    benign    Family History  Problem Relation Age of Onset  . Prostate cancer Neg Hx   . Breast cancer Neg Hx   . Bladder Cancer Neg Hx   . Kidney cancer Neg Hx     Social History Social History  Substance Use Topics  . Smoking status: Former Smoker -- 0.50 packs/day for 20 years    Types: Cigarettes    Quit date: 02/18/1979  . Smokeless tobacco: Never Used  . Alcohol Use: No    No Known Allergies  Current Outpatient Prescriptions  Medication Sig Dispense Refill  . atorvastatin (LIPITOR) 20 MG tablet Take 1 tablet (20 mg total) by mouth at bedtime. 30 tablet 5  . docusate sodium (COLACE) 100 MG capsule Take 1 capsule (100 mg total) by mouth 2 (two) times daily. 10 capsule 0  . HYDROcodone-acetaminophen (NORCO) 5-325 MG  tablet Take 1 tablet by mouth every 6 (six) hours as needed for moderate pain. 30 tablet 0  . HYDROcodone-acetaminophen (NORCO) 5-325 MG tablet Take 1-2 tablets by mouth every 4 (four) hours as needed for moderate pain. 30 tablet 0  . levothyroxine (SYNTHROID, LEVOTHROID) 88 MCG tablet Take 1 tablet (88 mcg total) by mouth daily. 90 tablet 3  . lisinopril (PRINIVIL,ZESTRIL) 40 MG tablet Take 1 tablet (40 mg total) by mouth daily. 90 tablet 3   No current facility-administered medications for this visit.    Review of Systems Review of Systems  Constitutional: Negative.   Respiratory: Negative.   Cardiovascular: Negative.     Blood pressure 122/70, pulse 74, resp. rate 12, height 5\' 4"  (1.626 m), weight 144 lb (65.318 kg).  Physical Exam Physical Exam  Constitutional: She is oriented to person, place, and time. She appears well-developed and well-nourished.  Pulmonary/Chest:    Left breast incision is clean and healing well.   Neurological: She is alert and oriented to person, place, and time.  Skin: Skin is warm and dry.    Data Reviewed 10/13/2014 core biopsy: DIAGNOSIS:  A. LEFT BREAST, RETROAREOLAR TOWARD 1:00; BIOPSY:  - INTRADUCTAL PAPILLOMA WITH USUAL DUCTAL HYPERPLASIA.  - NEGATIVE FOR ATYPIA AND MALIGNANCY.  Note:  The lesion measures 3 mm in this material. Multiple additional deeper  HE stained sections were examined.   05/14/2015 surgical excision: Diagnosis Breast, excision, left, 3 o'clock, short suture below nipple, long suture lateral - FIBROCYSTIC CHANGES. - FOCAL PSEUDOANGIOMATOUS STROMAL HYPERPLASIA ([place]). - NO EVIDENCE OF MALIGNANCY. Microscopic Comment There are fibrocystic changes with several large dominant benign cysts. There is no evidence of malignancy. (JDP:kh    Assessment    Doing well status post excision of multiple retroareolar cyst without evidence of recurrent papilloma.    Plan    Final examination 1 month will be planned.    Patient to return in one month. PCP:  Army Melia This information has been scribed by Gaspar Cola CMA.    Robert Bellow 05/23/2015, 10:42 AM

## 2015-05-26 ENCOUNTER — Ambulatory Visit: Payer: Medicare Other | Admitting: Cardiovascular Disease

## 2015-06-19 ENCOUNTER — Ambulatory Visit (INDEPENDENT_AMBULATORY_CARE_PROVIDER_SITE_OTHER): Payer: Medicare Other | Admitting: General Surgery

## 2015-06-19 ENCOUNTER — Encounter: Payer: Self-pay | Admitting: General Surgery

## 2015-06-19 VITALS — BP 114/70 | HR 84 | Resp 14 | Ht 64.0 in | Wt 143.0 lb

## 2015-06-19 DIAGNOSIS — D242 Benign neoplasm of left breast: Secondary | ICD-10-CM

## 2015-06-19 NOTE — Patient Instructions (Signed)
The patient is aware to call back for any questions or concerns.  

## 2015-06-19 NOTE — Progress Notes (Signed)
Patient ID: Rachel Lopez, female   DOB: 1940/05/24, 75 y.o.   MRN: [id number]  Chief Complaint  Patient presents with  . Follow-up    HPI Rachel Lopez is a 75 y.o. female.  here today for her follow up left breast excision mass was removed on 05/14/15. Patient states she is doing well denies any breast pain or discomfort.   HPI  Past Medical History  Diagnosis Date  . Thyroid disease   . Hypertension   . Hyperlipidemia   . Benign hypertension 10/18/2014  . Hypothyroidism, postablative 10/18/2014  . HLD (hyperlipidemia) 10/18/2014  . Papilloma of breast 10/28/2014  . Renal mass, right 01/01/2015  . Chronic kidney disease   . Arthritis     Past Surgical History  Procedure Laterality Date  . Radical hysterectomy  1969  . Total thyroidectomy      age 19  . Appendectomy      29yr  . Hemorrhoid surgery  2014  . Abdominal hysterectomy    . Laparoscopic nephrectomy, hand assisted Right 04/16/2015    Procedure: HAND ASSISTED LAPAROSCOPIC NEPHRECTOMY;  Surgeon: Nickie Retort, MD;  Location: [facility] ORS;  Service: Urology;  Laterality: Right;  . Breast biopsy Left 10/15/2014    papilloma  . Breast lumpectomy Left 05/14/2015    benign, Fibrocystic changes    Family History  Problem Relation Age of Onset  . Prostate cancer Neg Hx   . Breast cancer Neg Hx   . Bladder Cancer Neg Hx   . Kidney cancer Neg Hx     Social History Social History  Substance Use Topics  . Smoking status: Former Smoker -- 0.50 packs/day for 20 years    Types: Cigarettes    Quit date: 02/18/1979  . Smokeless tobacco: Never Used  . Alcohol Use: No    No Known Allergies  Current Outpatient Prescriptions  Medication Sig Dispense Refill  . aspirin 81 MG tablet Take 81 mg by mouth daily.    Marland Kitchen atorvastatin (LIPITOR) 20 MG tablet Take 1 tablet (20 mg total) by mouth at bedtime. 30 tablet 5  . docusate sodium (COLACE) 100 MG capsule Take 1 capsule (100 mg total) by mouth 2 (two) times daily. (Patient  taking differently: Take 100 mg by mouth daily as needed. ) 10 capsule 0  . levothyroxine (SYNTHROID, LEVOTHROID) 88 MCG tablet Take 1 tablet (88 mcg total) by mouth daily. 90 tablet 3  . lisinopril (PRINIVIL,ZESTRIL) 40 MG tablet Take 1 tablet (40 mg total) by mouth daily. 90 tablet 3   No current facility-administered medications for this visit.    Review of Systems Review of Systems  Constitutional: Negative.   Respiratory: Negative.   Cardiovascular: Negative.     Blood pressure 114/70, pulse 84, resp. rate 14, height 5\' 4"  (1.626 m), weight 143 lb (64.864 kg).  Physical Exam Physical Exam  Constitutional: She is oriented to person, place, and time. She appears well-developed and well-nourished.  Pulmonary/Chest:    Minimal thickening underneath scar left breast.  Neurological: She is alert and oriented to person, place, and time.  Skin: Skin is warm and dry.  Psychiatric: Her behavior is normal.    Data Reviewed Breast, excision, left, 3 o'clock, short suture below nipple, long suture lateral - FIBROCYSTIC CHANGES. - FOCAL PSEUDOANGIOMATOUS STROMAL HYPERPLASIA ([place]). - NO EVIDENCE OF MALIGNANCY. Microscopic Comment There are fibrocystic changes with several large dominant benign cysts. There is no evidence of malignancy. (JDP:kh  Assessment    Doing well status  post excision left retroareolar cyst, previous papilloma without residual.     Plan        Follow up as needed. The patient is aware to call back for any questions or concerns.  The patient should resume annual screening mammograms in May 2017 with her PCP.   PCP:  Halina Maidens This information has been scribed by Karie Fetch [place].    Robert Bellow 06/20/2015, 8:50 PM

## 2015-06-20 ENCOUNTER — Encounter: Payer: Self-pay | Admitting: General Surgery

## 2015-09-29 ENCOUNTER — Other Ambulatory Visit: Payer: Self-pay | Admitting: Internal Medicine

## 2015-11-04 ENCOUNTER — Other Ambulatory Visit: Payer: Self-pay | Admitting: Internal Medicine

## 2015-11-06 NOTE — Telephone Encounter (Signed)
pts coming in on 7/24

## 2015-11-10 ENCOUNTER — Encounter: Payer: Self-pay | Admitting: Internal Medicine

## 2015-11-10 ENCOUNTER — Ambulatory Visit (INDEPENDENT_AMBULATORY_CARE_PROVIDER_SITE_OTHER): Payer: Medicare Other | Admitting: Internal Medicine

## 2015-11-10 VITALS — BP 118/60 | HR 84 | Ht 64.0 in | Wt 139.4 lb

## 2015-11-10 DIAGNOSIS — E890 Postprocedural hypothyroidism: Secondary | ICD-10-CM | POA: Diagnosis not present

## 2015-11-10 DIAGNOSIS — I10 Essential (primary) hypertension: Secondary | ICD-10-CM

## 2015-11-10 DIAGNOSIS — E785 Hyperlipidemia, unspecified: Secondary | ICD-10-CM

## 2015-11-10 DIAGNOSIS — Z1231 Encounter for screening mammogram for malignant neoplasm of breast: Secondary | ICD-10-CM | POA: Diagnosis not present

## 2015-11-10 MED ORDER — ATORVASTATIN CALCIUM 20 MG PO TABS: 20.0000 mg | ORAL_TABLET | Freq: Every day | ORAL | 3 refills | Status: DC

## 2015-11-10 NOTE — Progress Notes (Signed)
Date:  11/10/2015   Name:  Rachel Lopez   DOB:  05-28-1940   MRN:  [id number]   Chief Complaint: Hypothyroidism She has fully recovered from her nephrectomy.  The tumor was benign.  There have been no follow up renal function tests.  She also had a lumpectomy for a benign tumor of the left breast.  That is completely healed.  She has not had follow up with Dr. Fleet Contras.  She is not sure when her next mammogram is due.  Likely due now but she wants to wait another 6 months.   Thyroid Problem  Presents for follow-up visit. Patient reports no anxiety, cold intolerance, constipation, depressed mood, fatigue or palpitations. The symptoms have been stable. Her past medical history is significant for hyperlipidemia.  Hypertension  This is a chronic problem. The current episode started more than 1 year ago. The problem is unchanged. Pertinent negatives include no chest pain, palpitations or shortness of breath. Hypertensive end-organ damage includes a thyroid problem.  Hyperlipidemia  This is a chronic problem. The current episode started more than 1 year ago. Recent lipid tests were reviewed and are normal. Pertinent negatives include no chest pain or shortness of breath.      Review of Systems  Constitutional: Negative for appetite change, fatigue and unexpected weight change.  HENT: Negative for trouble swallowing and voice change.   Eyes: Negative for visual disturbance.  Respiratory: Negative for choking, chest tightness and shortness of breath.   Cardiovascular: Negative for chest pain, palpitations and leg swelling.  Gastrointestinal: Negative for abdominal distention, abdominal pain and constipation.  Endocrine: Negative for cold intolerance.  Genitourinary: Negative for dysuria and hematuria.  Musculoskeletal: Negative for arthralgias.  Skin: Negative for rash.  Hematological: Negative for adenopathy.  Psychiatric/Behavioral: The patient is not nervous/anxious.     Patient  Active Problem List   Diagnosis Date Noted  . Angiomyolipoma of kidney 01/01/2015  . HLD (hyperlipidemia) 10/18/2014  . Benign hypertension 10/18/2014  . Hypothyroidism, postablative 10/18/2014  . Intraductal papilloma of left breast 10/18/2014    Prior to Admission medications   Medication Sig Start Date End Date Taking? Authorizing Provider  atorvastatin (LIPITOR) 20 MG tablet TAKE ONE TABLET BY MOUTH AT BEDTIME 09/30/15  Yes Glean Hess, MD  levothyroxine (SYNTHROID, LEVOTHROID) 88 MCG tablet TAKE ONE TABLET BY MOUTH ONCE DAILY 11/04/15  Yes Glean Hess, MD  lisinopril (PRINIVIL,ZESTRIL) 40 MG tablet Take 1 tablet (40 mg total) by mouth daily. 03/04/15  Yes Glean Hess, MD    No Known Allergies  Past Surgical History:  Procedure Laterality Date  . ABDOMINAL HYSTERECTOMY    . APPENDECTOMY     23yr  . BREAST BIOPSY Left 10/15/2014   papilloma  . BREAST LUMPECTOMY Left 05/14/2015   benign, Fibrocystic changes  . HEMORRHOID SURGERY  2014  . LAPAROSCOPIC NEPHRECTOMY, HAND ASSISTED Right 04/16/2015   Procedure: HAND ASSISTED LAPAROSCOPIC NEPHRECTOMY;  Surgeon: Nickie Retort, MD;  Location: [facility] ORS;  Service: Urology;  Laterality: Right;  . RADICAL HYSTERECTOMY  1969  . TOTAL THYROIDECTOMY     age 35    Social History  Substance Use Topics  . Smoking status: Former Smoker    Packs/day: 0.50    Years: 20.00    Types: Cigarettes    Quit date: 02/18/1979  . Smokeless tobacco: Never Used  . Alcohol use No     Medication list has been reviewed and updated.   Physical Exam  Constitutional:  She is oriented to person, place, and time. She appears well-developed. No distress.  HENT:  Head: Normocephalic and atraumatic.  Neck: Normal range of motion. Neck supple. No thyromegaly present.  Cardiovascular: Normal rate, regular rhythm and normal heart sounds.   Pulmonary/Chest: Effort normal and breath sounds normal. No respiratory distress.  Abdominal: There is  no hepatosplenomegaly. There is no CVA tenderness.  Musculoskeletal: Normal range of motion. She exhibits no edema.  Neurological: She is alert and oriented to person, place, and time.  Skin: Skin is warm and dry. No rash noted.  Psychiatric: She has a normal mood and affect. Her behavior is normal. Thought content normal.  Nursing note and vitals reviewed.   BP 118/60 (BP Location: Right Arm, Patient Position: Sitting, Cuff Size: Normal)   Pulse 84   Ht 5\' 4"  (1.626 m)   Wt 139 lb 6.4 oz (63.2 kg)   BMI 23.93 kg/m   Assessment and Plan: 1. Benign hypertension controlled - Basic metabolic panel  2. Hypothyroidism, postablative supplemented - TSH  3. HLD (hyperlipidemia) On statin therapy - atorvastatin (LIPITOR) 20 MG tablet; Take 1 tablet (20 mg total) by mouth at bedtime.  Dispense: 90 tablet; Refill: 3  4. Encounter for screening mammogram for breast cancer Patient will call to schedule - may need diagnostic - MM DIGITAL SCREENING BILATERAL; Future   Halina Maidens, MD [facility]  11/10/2015

## 2015-11-11 LAB — BASIC METABOLIC PANEL
BUN/Creatinine Ratio: 16 (ref 12–28)
BUN: 22 mg/dL (ref 8–27)
CALCIUM: 9.2 mg/dL (ref 8.7–10.3)
CHLORIDE: 100 mmol/L (ref 96–106)
CO2: 19 mmol/L (ref 18–29)
CREATININE: 1.36 mg/dL — AB (ref 0.57–1.00)
GFR calc Af Amer: 45 mL/min/{1.73_m2} — ABNORMAL LOW (ref >59)
GFR calc non Af Amer: 39 mL/min/{1.73_m2} — ABNORMAL LOW (ref >59)
GLUCOSE: 75 mg/dL (ref 65–99)
Potassium: 5.4 mmol/L — ABNORMAL HIGH (ref 3.5–5.2)
Sodium: 140 mmol/L (ref 134–144)

## 2015-11-11 LAB — TSH: TSH: 0.315 u[IU]/mL — AB (ref 0.450–4.500)

## 2015-11-26 ENCOUNTER — Other Ambulatory Visit: Payer: Self-pay | Admitting: Internal Medicine

## 2015-11-26 ENCOUNTER — Ambulatory Visit
Admission: RE | Admit: 2015-11-26 | Discharge: 2015-11-26 | Disposition: A | Discharge status: Home / Self Care | Payer: Medicare Other | Source: Ambulatory Visit | Attending: Internal Medicine | Admitting: Internal Medicine

## 2015-11-26 DIAGNOSIS — Z1231 Encounter for screening mammogram for malignant neoplasm of breast: Secondary | ICD-10-CM | POA: Insufficient documentation

## 2016-01-20 DIAGNOSIS — Z23 Encounter for immunization: Secondary | ICD-10-CM | POA: Diagnosis not present

## 2016-01-30 ENCOUNTER — Other Ambulatory Visit: Payer: Self-pay | Admitting: Internal Medicine

## 2016-03-01 ENCOUNTER — Other Ambulatory Visit: Payer: Self-pay | Admitting: Internal Medicine

## 2016-03-01 DIAGNOSIS — I10 Essential (primary) hypertension: Secondary | ICD-10-CM

## 2016-04-20 ENCOUNTER — Encounter: Payer: Self-pay | Admitting: Internal Medicine

## 2016-04-21 ENCOUNTER — Ambulatory Visit (INDEPENDENT_AMBULATORY_CARE_PROVIDER_SITE_OTHER): Payer: Medicare Other | Admitting: Internal Medicine

## 2016-04-21 ENCOUNTER — Encounter: Payer: Self-pay | Admitting: Internal Medicine

## 2016-04-21 VITALS — BP 118/60 | HR 78 | Temp 97.6°F | Ht 64.0 in | Wt 142.0 lb

## 2016-04-21 DIAGNOSIS — E890 Postprocedural hypothyroidism: Secondary | ICD-10-CM

## 2016-04-21 DIAGNOSIS — I10 Essential (primary) hypertension: Secondary | ICD-10-CM

## 2016-04-21 DIAGNOSIS — E782 Mixed hyperlipidemia: Secondary | ICD-10-CM

## 2016-04-21 DIAGNOSIS — N183 Chronic kidney disease, stage 3 unspecified: Secondary | ICD-10-CM

## 2016-04-21 DIAGNOSIS — R413 Other amnesia: Secondary | ICD-10-CM

## 2016-04-21 NOTE — Progress Notes (Signed)
Date:  04/21/2016   Name:  Rachel Lopez   DOB:  1940-06-14   MRN:  [id number]   Chief Complaint: Hyperlipidemia Hyperlipidemia  This is a chronic problem. Condition status: just started lipitor. Pertinent negatives include no chest pain, myalgias or shortness of breath. (She feels like she has has some decrease in memory since starting Lipitor.) There are no compliance problems.   Hypertension  This is a chronic problem. The current episode started more than 1 year ago. The problem is unchanged. The problem is controlled. Pertinent negatives include no chest pain, palpitations or shortness of breath. Hypertensive end-organ damage includes a thyroid problem.  Thyroid Problem  Presents for follow-up visit. Patient reports no fatigue, hoarse voice, leg swelling, palpitations, tremors or weight loss. The symptoms have been stable. Her past medical history is significant for hyperlipidemia.   Renal insufficiency - GFR had improved since nephrectomy.  She does not take any nsaids or aspirin.  She denies UTI sx, hematuria or difficulty urinating.   Review of Systems  Constitutional: Negative for chills, fatigue, fever and weight loss.  HENT: Negative for congestion, hoarse voice, sore throat and trouble swallowing.   Eyes: Negative for visual disturbance.  Respiratory: Negative for cough, chest tightness, shortness of breath and wheezing.   Cardiovascular: Negative for chest pain, palpitations and leg swelling.  Genitourinary: Negative for dysuria and hematuria.  Musculoskeletal: Negative for arthralgias, joint swelling and myalgias.  Neurological: Negative for tremors.    Patient Active Problem List   Diagnosis Date Noted  . Chronic renal insufficiency, stage 3 (moderate) 04/21/2016  . Angiomyolipoma of kidney 01/01/2015  . HLD (hyperlipidemia) 10/18/2014  . Benign hypertension 10/18/2014  . Hypothyroidism, postablative 10/18/2014  . Intraductal papilloma of left breast 10/18/2014     Prior to Admission medications   Medication Sig Start Date End Date Taking? Authorizing Provider  atorvastatin (LIPITOR) 20 MG tablet Take 1 tablet (20 mg total) by mouth at bedtime. 11/10/15  Yes Glean Hess, MD  levothyroxine (SYNTHROID, LEVOTHROID) 88 MCG tablet TAKE ONE TABLET BY MOUTH ONCE DAILY 01/30/16  Yes Glean Hess, MD  lisinopril (PRINIVIL,ZESTRIL) 40 MG tablet TAKE ONE TABLET BY MOUTH ONCE DAILY 03/01/16  Yes Glean Hess, MD   6CIT Screen 04/21/2016  What Year? 0 points  What month? 0 points  What time? 0 points  Count back from 20 0 points  Months in reverse 0 points  Repeat phrase 0 points  Total Score 0     No Known Allergies  Past Surgical History:  Procedure Laterality Date  . APPENDECTOMY     13yr  . BREAST BIOPSY Left 10/15/2014   papilloma -u/s bx /clip  . BREAST EXCISIONAL BIOPSY Left 04/2015   papilloma removed-neg  . HEMORRHOID SURGERY  2014  . LAPAROSCOPIC NEPHRECTOMY, HAND ASSISTED Right 04/16/2015   Procedure: HAND ASSISTED LAPAROSCOPIC NEPHRECTOMY;  Surgeon: Nickie Retort, MD;  Location: [facility] ORS;  Service: Urology;  Laterality: Right;  . RADICAL HYSTERECTOMY  1969  . TOTAL THYROIDECTOMY     age 37    Social History  Substance Use Topics  . Smoking status: Former Smoker    Packs/day: 0.50    Years: 20.00    Types: Cigarettes    Quit date: 02/18/1979  . Smokeless tobacco: Never Used  . Alcohol use No     Medication list has been reviewed and updated.   Physical Exam  Constitutional: She is oriented to person, place, and time. She appears  well-developed. No distress.  HENT:  Head: Normocephalic and atraumatic.  Neck: Normal range of motion. Neck supple. Carotid bruit is not present.  Cardiovascular: Normal rate, regular rhythm and normal heart sounds.   Pulmonary/Chest: Effort normal and breath sounds normal. No respiratory distress. She has no wheezes.  Abdominal: Soft. Bowel sounds are normal. She exhibits no  distension. There is no tenderness.  Musculoskeletal: Normal range of motion.  Neurological: She is alert and oriented to person, place, and time.  Skin: Skin is warm and dry. No rash noted.  Psychiatric: She has a normal mood and affect. Her speech is normal and behavior is normal. Judgment and thought content normal. Cognition and memory are normal.  Nursing note and vitals reviewed.   BP 118/60   Pulse 78   Temp 97.6 F (36.4 C)   Ht 5\' 4"  (1.626 m)   Wt 142 lb (64.4 kg)   SpO2 98%   BMI 24.37 kg/m   Assessment and Plan: 1. Benign hypertension controlled - CBC with Differential/Platelet  2. Hypothyroidism, postablative supplemented - TSH  3. Chronic renal insufficiency, stage 3 (moderate) Continue to monitor - Comprehensive metabolic panel  4. Mixed hyperlipidemia On lipitor - check labs - Lipid panel  5. Memory changes Hold lipitor for several months to see if there is improvement Consider different statin or lower dose 6-CIT was normal   Halina Maidens, MD [facility]  04/21/2016

## 2016-04-22 LAB — COMPREHENSIVE METABOLIC PANEL
ALT: 11 IU/L (ref 0–32)
AST: 16 IU/L (ref 0–40)
Albumin/Globulin Ratio: 2 (ref 1.2–2.2)
Albumin: 4.8 g/dL (ref 3.5–4.8)
Alkaline Phosphatase: 131 IU/L — ABNORMAL HIGH (ref 39–117)
BILIRUBIN TOTAL: 0.5 mg/dL (ref 0.0–1.2)
BUN/Creatinine Ratio: 15 (ref 12–28)
BUN: 21 mg/dL (ref 8–27)
CALCIUM: 9.6 mg/dL (ref 8.7–10.3)
CHLORIDE: 101 mmol/L (ref 96–106)
CO2: 25 mmol/L (ref 18–29)
Creatinine, Ser: 1.39 mg/dL — ABNORMAL HIGH (ref 0.57–1.00)
GFR, EST AFRICAN AMERICAN: 43 mL/min/{1.73_m2} — AB (ref >59)
GFR, EST NON AFRICAN AMERICAN: 37 mL/min/{1.73_m2} — AB (ref >59)
GLUCOSE: 82 mg/dL (ref 65–99)
Globulin, Total: 2.4 g/dL (ref 1.5–4.5)
Potassium: 5.7 mmol/L — ABNORMAL HIGH (ref 3.5–5.2)
Sodium: 139 mmol/L (ref 134–144)
TOTAL PROTEIN: 7.2 g/dL (ref 6.0–8.5)

## 2016-04-22 LAB — CBC WITH DIFFERENTIAL/PLATELET
BASOS ABS: 0.1 10*3/uL (ref 0.0–0.2)
Basos: 1 %
EOS (ABSOLUTE): 0.3 10*3/uL (ref 0.0–0.4)
Eos: 4 %
Hematocrit: 45 % (ref 34.0–46.6)
Hemoglobin: 14.8 g/dL (ref 11.1–15.9)
IMMATURE GRANS (ABS): 0 10*3/uL (ref 0.0–0.1)
IMMATURE GRANULOCYTES: 0 %
LYMPHS: 17 %
Lymphocytes Absolute: 1.1 10*3/uL (ref 0.7–3.1)
MCH: 29.5 pg (ref 26.6–33.0)
MCHC: 32.9 g/dL (ref 31.5–35.7)
MCV: 90 fL (ref 79–97)
Monocytes Absolute: 0.5 10*3/uL (ref 0.1–0.9)
Monocytes: 8 %
NEUTROS PCT: 70 %
Neutrophils Absolute: 4.4 10*3/uL (ref 1.4–7.0)
PLATELETS: 281 10*3/uL (ref 150–379)
RBC: 5.01 x10E6/uL (ref 3.77–5.28)
RDW: 12.9 % (ref 12.3–15.4)
WBC: 6.3 10*3/uL (ref 3.4–10.8)

## 2016-04-22 LAB — LIPID PANEL
CHOL/HDL RATIO: 3.7 ratio (ref 0.0–4.4)
Cholesterol, Total: 202 mg/dL — ABNORMAL HIGH (ref 100–199)
HDL: 55 mg/dL (ref >39)
LDL CALC: 118 mg/dL — AB (ref 0–99)
Triglycerides: 143 mg/dL (ref 0–149)
VLDL Cholesterol Cal: 29 mg/dL (ref 5–40)

## 2016-04-22 LAB — TSH: TSH: 0.654 u[IU]/mL (ref 0.450–4.500)

## 2016-10-11 ENCOUNTER — Telehealth: Payer: Self-pay | Admitting: Internal Medicine

## 2016-10-11 NOTE — Telephone Encounter (Signed)
Called pt to schedule Annual Wellness Visit with [facility]  - knb  °

## 2016-10-18 ENCOUNTER — Ambulatory Visit (INDEPENDENT_AMBULATORY_CARE_PROVIDER_SITE_OTHER): Payer: Medicare Other

## 2016-10-18 VITALS — BP 110/60 | HR 66 | Temp 97.7°F | Resp 15 | Ht 64.0 in | Wt 142.2 lb

## 2016-10-18 DIAGNOSIS — E2839 Other primary ovarian failure: Secondary | ICD-10-CM | POA: Diagnosis not present

## 2016-10-18 DIAGNOSIS — Z13820 Encounter for screening for osteoporosis: Secondary | ICD-10-CM | POA: Diagnosis not present

## 2016-10-18 DIAGNOSIS — Z23 Encounter for immunization: Secondary | ICD-10-CM

## 2016-10-18 DIAGNOSIS — Z0000 Encounter for general adult medical examination without abnormal findings: Secondary | ICD-10-CM | POA: Diagnosis not present

## 2016-10-18 NOTE — Progress Notes (Signed)
Subjective:   Rachel Lopez is a 76 y.o. female who presents for Medicare Annual (Subsequent) preventive examination.  Review of Systems:   Cardiac Risk Factors include: dyslipidemia;hypertension;advanced age (>81men, >61 women)     Objective:     Vitals: BP 110/60 (BP Location: Right Arm, Patient Position: Sitting)   Pulse 66   Temp 97.7 F (36.5 C)   Resp 15   Ht 5\' 4"  (1.626 m)   Wt 142 lb 3.2 oz (64.5 kg)   BMI 24.41 kg/m   Body mass index is 24.41 kg/m.   Tobacco History  Smoking Status  . Former Smoker  . Packs/day: 0.50  . Years: 20.00  . Types: Cigarettes  . Quit date: 02/18/1979  Smokeless Tobacco  . Never Used     Counseling given: Not Answered   Past Medical History:  Diagnosis Date  . Arthritis   . Benign hypertension 10/18/2014  . Chronic kidney disease   . HLD (hyperlipidemia) 10/18/2014  . Hyperlipidemia   . Hypertension   . Hypothyroidism, postablative 10/18/2014  . Papilloma of breast 10/28/2014  . Renal mass, right 01/01/2015  . Thyroid disease    Past Surgical History:  Procedure Laterality Date  . APPENDECTOMY     87yr  . BREAST BIOPSY Left 10/15/2014   papilloma -u/s bx /clip  . BREAST EXCISIONAL BIOPSY Left 04/2015   papilloma removed-neg  . HEMORRHOID SURGERY  2014  . LAPAROSCOPIC NEPHRECTOMY, HAND ASSISTED Right 04/16/2015   Procedure: HAND ASSISTED LAPAROSCOPIC NEPHRECTOMY;  Surgeon: Nickie Retort, MD;  Location: [facility] ORS;  Service: Urology;  Laterality: Right;  . RADICAL HYSTERECTOMY  1969  . TOTAL THYROIDECTOMY     age 63   Family History  Problem Relation Age of Onset  . Prostate cancer Neg Hx   . Breast cancer Neg Hx   . Bladder Cancer Neg Hx   . Kidney cancer Neg Hx    History  Sexual Activity  . Sexual activity: Not on file    Outpatient Encounter Prescriptions as of 10/18/2016  Medication Sig  . atorvastatin (LIPITOR) 20 MG tablet Take 1 tablet (20 mg total) by mouth at bedtime.  Marland Kitchen levothyroxine (SYNTHROID,  LEVOTHROID) 88 MCG tablet TAKE ONE TABLET BY MOUTH ONCE DAILY  . lisinopril (PRINIVIL,ZESTRIL) 40 MG tablet TAKE ONE TABLET BY MOUTH ONCE DAILY   No facility-administered encounter medications on file as of 10/18/2016.     Activities of Daily Living In your present state of health, do you have any difficulty performing the following activities: 10/18/2016  Hearing? N  Vision? N  Difficulty concentrating or making decisions? N  Walking or climbing stairs? N  Dressing or bathing? N  Doing errands, shopping? N  Preparing Food and eating ? N  Using the Toilet? N  In the past six months, have you accidently leaked urine? N  Do you have problems with loss of bowel control? N  Managing your Medications? N  Managing your Finances? N  Housekeeping or managing your Housekeeping? N  Some recent data might be hidden    Patient Care Team: Glean Hess, MD as PCP - General (Family Medicine) Glean Hess, MD (Family Medicine) Bary Castilla [place] Gleason, MD (General Surgery)    Assessment:     Exercise Activities and Dietary recommendations Current Exercise Habits: Home exercise routine, Type of exercise: walking, Time (Minutes): 20, Frequency (Times/Week): 7, Weekly Exercise (Minutes/Week): 140, Intensity: Mild, Exercise limited by: None identified  Goals    . Cut  out extra servings    . Increase water intake          Recommend drinking at least 4-5 glasses of water a day       Fall Risk Fall Risk  10/18/2016 12/30/2014  Falls in the past year? No No   Depression Screen PHQ 2/9 Scores 10/18/2016 12/30/2014  PHQ - 2 Score 0 0     Cognitive Function     6CIT Screen 10/18/2016 04/21/2016  What Year? 0 points 0 points  What month? 0 points 0 points  What time? 0 points 0 points  Count back from 20 0 points 0 points  Months in reverse 0 points 0 points  Repeat phrase 0 points 0 points  Total Score 0 0    Immunization History  Administered Date(s) Administered  .  Influenza-Unspecified 01/27/2015  . Pneumococcal Conjugate-13 09/10/2014  . Pneumococcal Polysaccharide-23 10/18/2016   Screening Tests Health Maintenance  Topic Date Due  . DEXA SCAN  01/23/2007  . TETANUS/TDAP  04/19/2017 (Originally 01/22/1961)  . INFLUENZA VACCINE  11/17/2016  . MAMMOGRAM  11/25/2017  . COLONOSCOPY  04/20/2021  . PNA vac Low Risk Adult  Completed      Plan:    I have personally reviewed and addressed the Medicare Annual Wellness questionnaire and have noted the following in the patient's chart:  A. Medical and social history B. Use of alcohol, tobacco or illicit drugs  C. Current medications and supplements D. Functional ability and status E.  Nutritional status F.  Physical activity G. Advance directives H. List of other physicians I.  Hospitalizations, surgeries, and ER visits in previous 12 months J.  [place] such as hearing and vision if needed, cognitive and depression L. Referrals and appointments   In addition, I have reviewed and discussed with patient certain preventive protocols, quality metrics, and best practice recommendations. A written personalized care plan for preventive services as well as general preventive health recommendations were provided to patient.   Signed,  Tyler Aas, LPN Nurse Health Advisor   MD Recommendations:

## 2016-10-18 NOTE — Patient Instructions (Addendum)
Rachel Lopez , Thank you for taking time to come for your Medicare Wellness Visit. I appreciate your ongoing commitment to your health goals. Please review the following plan we discussed and let me know if I can assist you in the future.   Screening recommendations/referrals: Colonoscopy: Completed 04/19/2008 Mammogram: completed 11/27/2015 Bone Density: due Recommended yearly ophthalmology/optometry visit for glaucoma screening and checkup Recommended yearly dental visit for hygiene and checkup  Vaccinations: Influenza vaccine: up to date, due 02/2017 Pneumococcal vaccine: Pneumovax 23 done today Tdap vaccine: Due, check with your insurance company for coverage Shingles vaccine: Due, check with your insurance company for coverage  Advanced directives: Advance directive discussed with you today. I have provided a copy for you to complete at home and have notarized. Once this is complete please bring a copy in to our office so we can scan it into your chart.  Conditions/risks identified: Recommend drinking at least 4-5 glasses of water a day   Next appointment: Follow up on 10/27/2016 at 9:30am with Dr.berglund. Follow up in one year for your annual wellness exam.    Preventive Care 65 Years and Older, Female Preventive care refers to lifestyle choices and visits with your health care provider that can promote health and wellness. What does preventive care include?  A yearly physical exam. This is also called an annual well check.  Dental exams once or twice a year.  Routine eye exams. Ask your health care provider how often you should have your eyes checked.  Personal lifestyle choices, including:  Daily care of your teeth and gums.  Regular physical activity.  Eating a healthy diet.  Avoiding tobacco and drug use.  Limiting alcohol use.  Practicing safe sex.  Taking low-dose aspirin every day.  Taking vitamin and mineral supplements as recommended by your health care  provider. What happens during an annual well check? The services and screenings done by your health care provider during your annual well check will depend on your age, overall health, lifestyle risk factors, and family history of disease. Counseling  Your health care provider may ask you questions about your:  Alcohol use.  Tobacco use.  Drug use.  Emotional well-being.  Home and relationship well-being.  Sexual activity.  Eating habits.  History of falls.  Memory and ability to understand (cognition).  Work and work Statistician.  Reproductive health. Screening  You may have the following tests or measurements:  Height, weight, and BMI.  Blood pressure.  Lipid and cholesterol levels. These may be checked every 5 years, or more frequently if you are over 60 years old.  Skin check.  Lung cancer screening. You may have this screening every year starting at age 75 if you have a 30-pack-year history of smoking and currently smoke or have quit within the past 15 years.  Fecal occult blood test (FOBT) of the stool. You may have this test every year starting at age 75.  Flexible sigmoidoscopy or colonoscopy. You may have a sigmoidoscopy every 5 years or a colonoscopy every 10 years starting at age 75.  Hepatitis C blood test.  Hepatitis B blood test.  Sexually transmitted disease (STD) testing.  Diabetes screening. This is done by checking your blood sugar (glucose) after you have not eaten for a while (fasting). You may have this done every 1-3 years.  Bone density scan. This is done to screen for osteoporosis. You may have this done starting at age 75.  Mammogram. This may be done every 1-2 years. Talk  to your health care provider about how often you should have regular mammograms. Talk with your health care provider about your test results, treatment options, and if necessary, the need for more tests. Vaccines  Your health care provider may recommend certain  vaccines, such as:  Influenza vaccine. This is recommended every year.  Tetanus, diphtheria, and acellular pertussis (Tdap, Td) vaccine. You may need a Td booster every 10 years.  Zoster vaccine. You may need this after age 75.  Pneumococcal 13-valent conjugate (PCV13) vaccine. One dose is recommended after age 75.  Pneumococcal polysaccharide (PPSV23) vaccine. One dose is recommended after age 75. Talk to your health care provider about which screenings and vaccines you need and how often you need them. This information is not intended to replace advice given to you by your health care provider. Make sure you discuss any questions you have with your health care provider. Document Released: 05/02/2015 Document Revised: 12/24/2015 Document Reviewed: 02/04/2015 Elsevier Interactive Patient Education  2017 [place] Prevention in the Home Falls can cause injuries. They can happen to people of all ages. There are many things you can do to make your home safe and to help prevent falls. What can I do on the outside of my home?  Regularly fix the edges of walkways and driveways and fix any cracks.  Remove anything that might make you trip as you walk through a door, such as a raised step or threshold.  Trim any bushes or trees on the path to your home.  Use bright outdoor lighting.  Clear any walking paths of anything that might make someone trip, such as rocks or tools.  Regularly check to see if handrails are loose or broken. Make sure that both sides of any steps have handrails.  Any raised decks and porches should have guardrails on the edges.  Have any leaves, snow, or ice cleared regularly.  Use sand or salt on walking paths during winter.  Clean up any spills in your garage right away. This includes oil or grease spills. What can I do in the bathroom?  Use night lights.  Install grab bars by the toilet and in the tub and shower. Do not use towel bars as grab  bars.  Use non-skid mats or decals in the tub or shower.  If you need to sit down in the shower, use a plastic, non-slip stool.  Keep the floor dry. Clean up any water that spills on the floor as soon as it happens.  Remove soap buildup in the tub or shower regularly.  Attach bath mats securely with double-sided non-slip rug tape.  Do not have throw rugs and other things on the floor that can make you trip. What can I do in the bedroom?  Use night lights.  Make sure that you have a light by your bed that is easy to reach.  Do not use any sheets or blankets that are too big for your bed. They should not hang down onto the floor.  Have a firm chair that has side arms. You can use this for support while you get dressed.  Do not have throw rugs and other things on the floor that can make you trip. What can I do in the kitchen?  Clean up any spills right away.  Avoid walking on wet floors.  Keep items that you use a lot in easy-to-reach places.  If you need to reach something above you, use a strong step stool that  has a grab bar.  Keep electrical cords out of the way.  Do not use floor polish or wax that makes floors slippery. If you must use wax, use non-skid floor wax.  Do not have throw rugs and other things on the floor that can make you trip. What can I do with my stairs?  Do not leave any items on the stairs.  Make sure that there are handrails on both sides of the stairs and use them. Fix handrails that are broken or loose. Make sure that handrails are as long as the stairways.  Check any carpeting to make sure that it is firmly attached to the stairs. Fix any carpet that is loose or worn.  Avoid having throw rugs at the top or bottom of the stairs. If you do have throw rugs, attach them to the floor with carpet tape.  Make sure that you have a light switch at the top of the stairs and the bottom of the stairs. If you do not have them, ask someone to add them for  you. What else can I do to help prevent falls?  Wear shoes that:  Do not have high heels.  Have rubber bottoms.  Are comfortable and fit you well.  Are closed at the toe. Do not wear sandals.  If you use a stepladder:  Make sure that it is fully opened. Do not climb a closed stepladder.  Make sure that both sides of the stepladder are locked into place.  Ask someone to hold it for you, if possible.  Clearly mark and make sure that you can see:  Any grab bars or handrails.  First and last steps.  Where the edge of each step is.  Use tools that help you move around (mobility aids) if they are needed. These include:  Canes.  Walkers.  Scooters.  Crutches.  Turn on the lights when you go into a dark area. Replace any light bulbs as soon as they burn out.  Set up your furniture so you have a clear path. Avoid moving your furniture around.  If any of your floors are uneven, fix them.  If there are any pets around you, be aware of where they are.  Review your medicines with your doctor. Some medicines can make you feel dizzy. This can increase your chance of falling. Ask your doctor what other things that you can do to help prevent falls. This information is not intended to replace advice given to you by your health care provider. Make sure you discuss any questions you have with your health care provider. Document Released: 01/30/2009 Document Revised: 09/11/2015 Document Reviewed: 05/10/2014 Elsevier Interactive Patient Education  2017 [employer].

## 2016-10-27 ENCOUNTER — Ambulatory Visit (INDEPENDENT_AMBULATORY_CARE_PROVIDER_SITE_OTHER): Payer: Medicare Other | Admitting: Internal Medicine

## 2016-10-27 ENCOUNTER — Encounter: Payer: Self-pay | Admitting: Internal Medicine

## 2016-10-27 VITALS — BP 112/64 | HR 79 | Ht 64.0 in | Wt 143.0 lb

## 2016-10-27 DIAGNOSIS — Z0000 Encounter for general adult medical examination without abnormal findings: Secondary | ICD-10-CM | POA: Diagnosis not present

## 2016-10-27 DIAGNOSIS — D242 Benign neoplasm of left breast: Secondary | ICD-10-CM | POA: Diagnosis not present

## 2016-10-27 DIAGNOSIS — Z1231 Encounter for screening mammogram for malignant neoplasm of breast: Secondary | ICD-10-CM | POA: Diagnosis not present

## 2016-10-27 DIAGNOSIS — N183 Chronic kidney disease, stage 3 unspecified: Secondary | ICD-10-CM

## 2016-10-27 DIAGNOSIS — I10 Essential (primary) hypertension: Secondary | ICD-10-CM

## 2016-10-27 DIAGNOSIS — E782 Mixed hyperlipidemia: Secondary | ICD-10-CM

## 2016-10-27 DIAGNOSIS — E890 Postprocedural hypothyroidism: Secondary | ICD-10-CM | POA: Diagnosis not present

## 2016-10-27 LAB — POCT URINALYSIS DIPSTICK
BILIRUBIN UA: NEGATIVE
GLUCOSE UA: NEGATIVE
Ketones, UA: NEGATIVE
Leukocytes, UA: NEGATIVE
Nitrite, UA: NEGATIVE
Protein, UA: NEGATIVE
SPEC GRAV UA: 1.01 (ref 1.010–1.025)
Urobilinogen, UA: 0.2 E.U./dL
pH, UA: 5 (ref 5.0–8.0)

## 2016-10-27 MED ORDER — ATORVASTATIN CALCIUM 20 MG PO TABS: 20.0000 mg | ORAL_TABLET | Freq: Every day | ORAL | 3 refills | Status: DC

## 2016-10-27 NOTE — Progress Notes (Signed)
Date:  10/27/2016   Name:  Rachel Lopez   DOB:  December 27, 1940   MRN:  [id number]  Seen last week for MAW - PPV-23 given and DEXA ordered. Chief Complaint: Hypertension; Hyperlipidemia; and Hypothyroidism Hypertension  This is a chronic problem. The problem is controlled. Pertinent negatives include no chest pain, headaches, palpitations or shortness of breath. Risk factors for coronary artery disease include dyslipidemia. Past treatments include ACE inhibitors. The current treatment provides significant improvement. Identifiable causes of hypertension include a thyroid problem.  Hyperlipidemia  The problem is controlled. Pertinent negatives include no chest pain or shortness of breath. Current antihyperlipidemic treatment includes statins.  Thyroid Problem  Presents for follow-up visit. Patient reports no anxiety, constipation, diarrhea, fatigue, palpitations or tremors. The symptoms have been stable. Her past medical history is significant for hyperlipidemia.  Breast biopsy - benign excisional bx 04/2015 (papilloma). Had normal mammogram last August.  No breast complaints, masses or tenderness.  Lab Results  Component Value Date   TSH 0.654 04/21/2016   Lab Results  Component Value Date   CREATININE 1.39 (H) 04/21/2016   Lab Results  Component Value Date   CHOL 202 (H) 04/21/2016   HDL 55 04/21/2016   LDLCALC 118 (H) 04/21/2016   TRIG 143 04/21/2016   CHOLHDL 3.7 04/21/2016     Review of Systems  Constitutional: Negative for chills, fatigue and fever.  HENT: Negative for congestion, hearing loss, tinnitus, trouble swallowing and voice change.   Eyes: Negative for visual disturbance.  Respiratory: Negative for cough, chest tightness, shortness of breath and wheezing.   Cardiovascular: Negative for chest pain, palpitations and leg swelling.  Gastrointestinal: Negative for abdominal pain, constipation, diarrhea and vomiting.  Endocrine: Negative for polydipsia and polyuria.    Genitourinary: Negative for dysuria, frequency, genital sores, vaginal bleeding and vaginal discharge.  Musculoskeletal: Negative for arthralgias, gait problem and joint swelling.  Skin: Negative for color change and rash.  Neurological: Negative for dizziness, tremors, light-headedness and headaches.  Hematological: Negative for adenopathy. Does not bruise/bleed easily.  Psychiatric/Behavioral: Negative for dysphoric mood and sleep disturbance. The patient is not nervous/anxious.     Patient Active Problem List   Diagnosis Date Noted  . Chronic renal insufficiency, stage 3 (moderate) 04/21/2016  . Angiomyolipoma of kidney 01/01/2015  . HLD (hyperlipidemia) 10/18/2014  . Benign hypertension 10/18/2014  . Hypothyroidism, postablative 10/18/2014  . Intraductal papilloma of left breast 10/18/2014    Prior to Admission medications   Medication Sig Start Date End Date Taking? Authorizing Provider  atorvastatin (LIPITOR) 20 MG tablet Take 1 tablet (20 mg total) by mouth at bedtime. 11/10/15   Glean Hess, MD  levothyroxine (SYNTHROID, LEVOTHROID) 88 MCG tablet TAKE ONE TABLET BY MOUTH ONCE DAILY 01/30/16   Glean Hess, MD  lisinopril (PRINIVIL,ZESTRIL) 40 MG tablet TAKE ONE TABLET BY MOUTH ONCE DAILY 03/01/16   Glean Hess, MD    No Known Allergies  Past Surgical History:  Procedure Laterality Date  . APPENDECTOMY     81yr  . BREAST BIOPSY Left 10/15/2014   papilloma -u/s bx /clip  . BREAST EXCISIONAL BIOPSY Left 04/2015   papilloma removed-neg  . HEMORRHOID SURGERY  2014  . LAPAROSCOPIC NEPHRECTOMY, HAND ASSISTED Right 04/16/2015   Procedure: HAND ASSISTED LAPAROSCOPIC NEPHRECTOMY;  Surgeon: Nickie Retort, MD;  Location: [facility] ORS;  Service: Urology;  Laterality: Right;  . RADICAL HYSTERECTOMY  1969  . TOTAL THYROIDECTOMY     age 51    Social  History  Substance Use Topics  . Smoking status: Former Smoker    Packs/day: 0.50    Years: 20.00    Types:  Cigarettes    Quit date: 02/18/1979  . Smokeless tobacco: Never Used  . Alcohol use No     Medication list has been reviewed and updated.   Physical Exam  Constitutional: She is oriented to person, place, and time. She appears well-developed and well-nourished. No distress.  HENT:  Head: Normocephalic and atraumatic.  Right Ear: Tympanic membrane and ear canal normal.  Left Ear: Tympanic membrane and ear canal normal.  Nose: Right sinus exhibits no maxillary sinus tenderness. Left sinus exhibits no maxillary sinus tenderness.  Mouth/Throat: Uvula is midline and oropharynx is clear and moist.  Eyes: Conjunctivae and EOM are normal. Right eye exhibits no discharge. Left eye exhibits no discharge. No scleral icterus.  Neck: Normal range of motion. Carotid bruit is not present. No erythema present. No thyromegaly present.  Cardiovascular: Normal rate, regular rhythm, normal heart sounds and normal pulses.   Pulmonary/Chest: Effort normal. No respiratory distress. She has no wheezes. Right breast exhibits no mass, no nipple discharge, no skin change and no tenderness. Left breast exhibits no mass, no nipple discharge, no skin change (resolving superfical bruise in outer lower breast) and no tenderness.  Abdominal: Soft. Bowel sounds are normal. There is no hepatosplenomegaly. There is no tenderness. There is no CVA tenderness.  Musculoskeletal: Normal range of motion.       Right knee: She exhibits effusion (small).       Left knee: Normal.  Lymphadenopathy:    She has no cervical adenopathy.    She has no axillary adenopathy.  Neurological: She is alert and oriented to person, place, and time. She has normal reflexes. No cranial nerve deficit or sensory deficit.  Skin: Skin is warm, dry and intact. No rash noted.  Psychiatric: She has a normal mood and affect. Her speech is normal and behavior is normal. Thought content normal.  Nursing note and vitals reviewed.   BP 112/64   Pulse 79    Ht 5\' 4"  (1.626 m)   Wt 143 lb (64.9 kg)   SpO2 96%   BMI 24.55 kg/m   Assessment and Plan 1. Annual physical exam Doing well, continue current regimen  1. Benign hypertension controlled - CBC with Differential/Platelet  2. Hypothyroidism, postablative supplemented - TSH  3. Chronic renal insufficiency, stage 3 (moderate) Continue to monitor - Comprehensive metabolic panel - POCT urinalysis dipstick  4. Mixed hyperlipidemia Continue statin therapy - consider lower dose if values are low - Lipid panel - atorvastatin (LIPITOR) 20 MG tablet; Take 1 tablet (20 mg total) by mouth at bedtime.  Dispense: 90 tablet; Refill: 3  5. Intraductal papilloma of left breast Resume annual screening mammograms  6. Encounter for screening mammogram for breast cancer - MM DIGITAL SCREENING BILATERAL; Future    Meds ordered this encounter  Medications  . atorvastatin (LIPITOR) 20 MG tablet    Sig: Take 1 tablet (20 mg total) by mouth at bedtime.    Dispense:  90 tablet    Refill:  [place], MD [facility]  10/27/2016

## 2016-10-27 NOTE — Patient Instructions (Signed)
Health Maintenance  Topic Date Due  . DEXA SCAN  01/23/2007  . TETANUS/TDAP  04/19/2017 (Originally 01/22/1961)  . INFLUENZA VACCINE  11/17/2016  . MAMMOGRAM  11/25/2017  . COLONOSCOPY  04/20/2021  . PNA vac Low Risk Adult  Completed    Breast Self-Awareness Breast self-awareness means being familiar with how your breasts look and feel. It involves checking your breasts regularly and reporting any changes to your health care provider. Practicing breast self-awareness is important. A change in your breasts can be a sign of a serious medical problem. Being familiar with how your breasts look and feel allows you to find any problems early, when treatment is more likely to be successful. All women should practice breast self-awareness, including women who have had breast implants. How to do a breast self-exam One way to learn what is normal for your breasts and whether your breasts are changing is to do a breast self-exam. To do a breast self-exam: Look for Changes  1. Remove all the clothing above your waist. 2. Stand in front of a mirror in a room with good lighting. 3. Put your hands on your hips. 4. Push your hands firmly downward. 5. Compare your breasts in the mirror. Look for differences between them (asymmetry), such as: ? Differences in shape. ? Differences in size. ? Puckers, dips, and bumps in one breast and not the other. 6. Look at each breast for changes in your skin, such as: ? Redness. ? Scaly areas. 7. Look for changes in your nipples, such as: ? Discharge. ? Bleeding. ? Dimpling. ? Redness. ? A change in position. Feel for Changes  Carefully feel your breasts for lumps and changes. It is best to do this while lying on your back on the floor and again while sitting or standing in the shower or tub with soapy water on your skin. Feel each breast in the following way:  Place the arm on the side of the breast you are examining above your head.  Feel your breast with  the other hand.  Start in the nipple area and make  inch (2 cm) overlapping circles to feel your breast. Use the pads of your three middle fingers to do this. Apply light pressure, then medium pressure, then firm pressure. The light pressure will allow you to feel the tissue closest to the skin. The medium pressure will allow you to feel the tissue that is a little deeper. The firm pressure will allow you to feel the tissue close to the ribs.  Continue the overlapping circles, moving downward over the breast until you feel your ribs below your breast.  Move one finger-width toward the center of the body. Continue to use the  inch (2 cm) overlapping circles to feel your breast as you move slowly up toward your collarbone.  Continue the up and down exam using all three pressures until you reach your armpit.  Write Down What You Find  Write down what is normal for each breast and any changes that you find. Keep a written record with breast changes or normal findings for each breast. By writing this information down, you do not need to depend only on memory for size, tenderness, or location. Write down where you are in your menstrual cycle, if you are still menstruating. If you are having trouble noticing differences in your breasts, do not get discouraged. With time you will become more familiar with the variations in your breasts and more comfortable with the exam. How  often should I examine my breasts? Examine your breasts every month. If you are breastfeeding, the best time to examine your breasts is after a feeding or after using a breast pump. If you menstruate, the best time to examine your breasts is 5-7 days after your period is over. During your period, your breasts are lumpier, and it may be more difficult to notice changes. When should I see my health care provider? See your health care provider if you notice:  A change in shape or size of your breasts or nipples.  A change in the skin  of your breast or nipples, such as a reddened or scaly area.  Unusual discharge from your nipples.  A lump or thick area that was not there before.  Pain in your breasts.  Anything that concerns you.  This information is not intended to replace advice given to you by your health care provider. Make sure you discuss any questions you have with your health care provider. Document Released: 04/05/2005 Document Revised: 09/11/2015 Document Reviewed: 02/23/2015 Elsevier Interactive Patient Education  [employer].

## 2016-10-28 LAB — COMPREHENSIVE METABOLIC PANEL
ALK PHOS: 133 IU/L — AB (ref 39–117)
ALT: 10 IU/L (ref 0–32)
AST: 20 IU/L (ref 0–40)
Albumin/Globulin Ratio: 2 (ref 1.2–2.2)
Albumin: 4.7 g/dL (ref 3.5–4.8)
BILIRUBIN TOTAL: 0.4 mg/dL (ref 0.0–1.2)
BUN/Creatinine Ratio: 12 (ref 12–28)
BUN: 19 mg/dL (ref 8–27)
CHLORIDE: 99 mmol/L (ref 96–106)
CO2: 22 mmol/L (ref 20–29)
Calcium: 9.3 mg/dL (ref 8.7–10.3)
Creatinine, Ser: 1.53 mg/dL — ABNORMAL HIGH (ref 0.57–1.00)
GFR calc non Af Amer: 33 mL/min/{1.73_m2} — ABNORMAL LOW (ref >59)
GFR, EST AFRICAN AMERICAN: 38 mL/min/{1.73_m2} — AB (ref >59)
GLUCOSE: 77 mg/dL (ref 65–99)
Globulin, Total: 2.4 g/dL (ref 1.5–4.5)
Potassium: 5.1 mmol/L (ref 3.5–5.2)
Sodium: 137 mmol/L (ref 134–144)
TOTAL PROTEIN: 7.1 g/dL (ref 6.0–8.5)

## 2016-10-28 LAB — CBC WITH DIFFERENTIAL/PLATELET
BASOS ABS: 0.1 10*3/uL (ref 0.0–0.2)
Basos: 1 %
EOS (ABSOLUTE): 0.3 10*3/uL (ref 0.0–0.4)
Eos: 4 %
Hematocrit: 44.1 % (ref 34.0–46.6)
Hemoglobin: 14.3 g/dL (ref 11.1–15.9)
IMMATURE GRANS (ABS): 0 10*3/uL (ref 0.0–0.1)
IMMATURE GRANULOCYTES: 0 %
LYMPHS: 14 %
Lymphocytes Absolute: 1 10*3/uL (ref 0.7–3.1)
MCH: 29.4 pg (ref 26.6–33.0)
MCHC: 32.4 g/dL (ref 31.5–35.7)
MCV: 91 fL (ref 79–97)
Monocytes Absolute: 0.6 10*3/uL (ref 0.1–0.9)
Monocytes: 8 %
NEUTROS ABS: 4.9 10*3/uL (ref 1.4–7.0)
NEUTROS PCT: 73 %
PLATELETS: 283 10*3/uL (ref 150–379)
RBC: 4.87 x10E6/uL (ref 3.77–5.28)
RDW: 12.9 % (ref 12.3–15.4)
WBC: 6.8 10*3/uL (ref 3.4–10.8)

## 2016-10-28 LAB — LIPID PANEL
CHOLESTEROL TOTAL: 176 mg/dL (ref 100–199)
Chol/HDL Ratio: 3.5 ratio (ref 0.0–4.4)
HDL: 51 mg/dL (ref >39)
LDL Calculated: 100 mg/dL — ABNORMAL HIGH (ref 0–99)
TRIGLYCERIDES: 124 mg/dL (ref 0–149)
VLDL CHOLESTEROL CAL: 25 mg/dL (ref 5–40)

## 2016-10-28 LAB — TSH: TSH: 1.08 u[IU]/mL (ref 0.450–4.500)

## 2016-10-29 ENCOUNTER — Other Ambulatory Visit: Payer: Self-pay

## 2016-11-02 ENCOUNTER — Ambulatory Visit
Admission: RE | Admit: 2016-11-02 | Discharge: 2016-11-02 | Disposition: A | Discharge status: Home / Self Care | Payer: Medicare Other | Source: Ambulatory Visit | Attending: Internal Medicine | Admitting: Internal Medicine

## 2016-11-02 DIAGNOSIS — Z13820 Encounter for screening for osteoporosis: Secondary | ICD-10-CM

## 2016-11-02 DIAGNOSIS — E2839 Other primary ovarian failure: Secondary | ICD-10-CM

## 2016-11-29 ENCOUNTER — Ambulatory Visit
Admission: RE | Admit: 2016-11-29 | Discharge: 2016-11-29 | Disposition: A | Discharge status: Home / Self Care | Payer: Medicare Other | Source: Ambulatory Visit | Attending: Internal Medicine | Admitting: Internal Medicine

## 2016-11-29 DIAGNOSIS — Z1231 Encounter for screening mammogram for malignant neoplasm of breast: Secondary | ICD-10-CM | POA: Insufficient documentation

## 2016-12-27 ENCOUNTER — Ambulatory Visit: Payer: Medicare Other

## 2017-01-21 DIAGNOSIS — Z23 Encounter for immunization: Secondary | ICD-10-CM | POA: Diagnosis not present

## 2017-01-24 ENCOUNTER — Other Ambulatory Visit: Payer: Self-pay | Admitting: Internal Medicine

## 2017-02-01 IMAGING — CR DG CHEST 1V PORT
1 series · 1 of 1 positions shown · non-contrast
Comparison: None.

CLINICAL DATA: Status post right nephrectomy.

EXAM:
PORTABLE CHEST 1 VIEW

[ap]
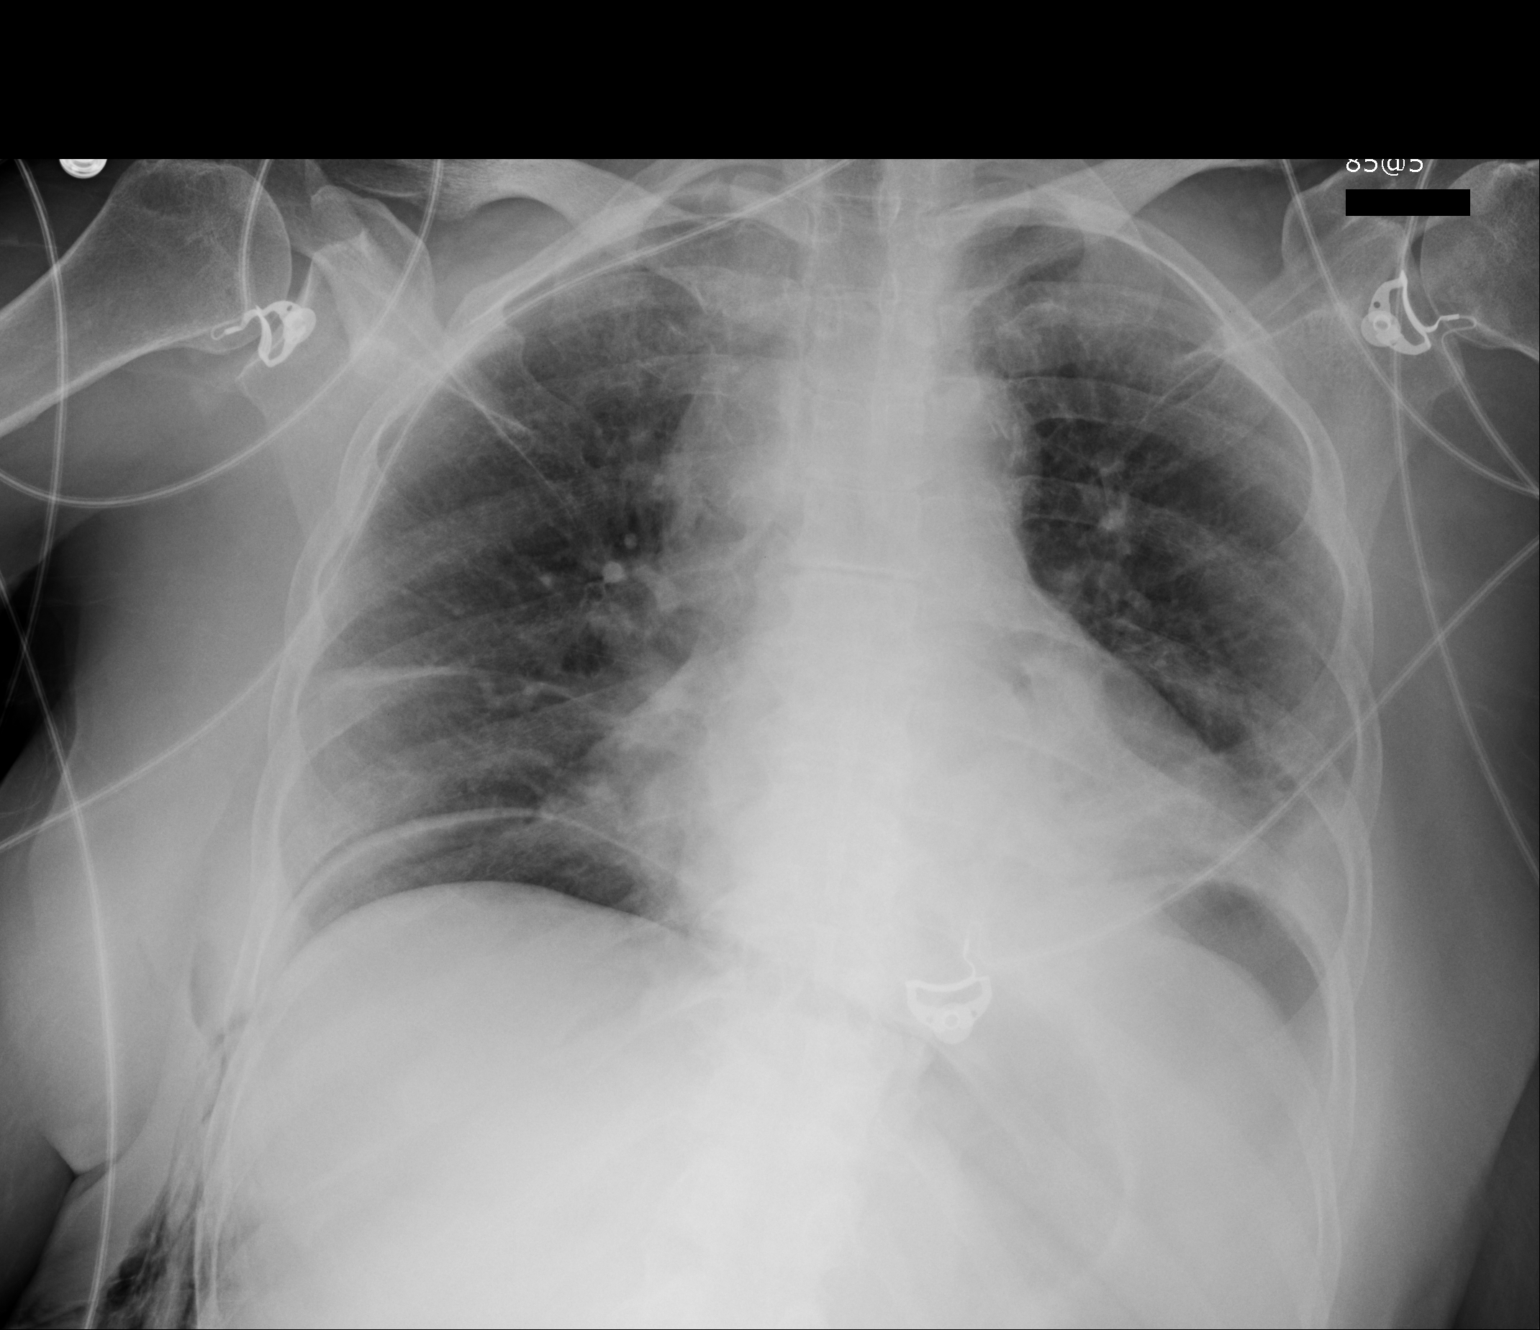

[1 of 1 positions shown; findings below may reference images not displayed]

FINDINGS: There is pneumoperitoneum consistent with recent surgery. There is
no demonstrable pneumothorax. There is soft tissue air over the
right lateral abdomen and lower chest. There is focal consolidation
in the left base with small left effusion. There is minimal
atelectasis in the right base. Heart is borderline prominent with
pulmonary vascularity within normal limits. No adenopathy.
IMPRESSION: Pneumoperitoneum with air in the lateral right abdomen consistent
with recent surgery. No pneumothorax. Mild consolidation left base
with small left effusion. Slight atelectasis right base.

## 2017-02-20 ENCOUNTER — Other Ambulatory Visit: Payer: Self-pay | Admitting: Internal Medicine

## 2017-02-20 DIAGNOSIS — I10 Essential (primary) hypertension: Secondary | ICD-10-CM

## 2017-04-29 ENCOUNTER — Ambulatory Visit: Payer: Medicare Other | Admitting: Internal Medicine

## 2017-05-04 ENCOUNTER — Encounter: Payer: Self-pay | Admitting: Internal Medicine

## 2017-05-04 ENCOUNTER — Ambulatory Visit: Payer: Medicare Other | Admitting: Internal Medicine

## 2017-05-05 ENCOUNTER — Ambulatory Visit (INDEPENDENT_AMBULATORY_CARE_PROVIDER_SITE_OTHER): Payer: Medicare Other | Admitting: Internal Medicine

## 2017-05-05 ENCOUNTER — Encounter: Payer: Self-pay | Admitting: Internal Medicine

## 2017-05-05 VITALS — BP 110/78 | HR 79 | Ht 64.0 in | Wt 140.0 lb

## 2017-05-05 DIAGNOSIS — I10 Essential (primary) hypertension: Secondary | ICD-10-CM

## 2017-05-05 DIAGNOSIS — N183 Chronic kidney disease, stage 3 unspecified: Secondary | ICD-10-CM

## 2017-05-05 DIAGNOSIS — E890 Postprocedural hypothyroidism: Secondary | ICD-10-CM | POA: Diagnosis not present

## 2017-05-05 NOTE — Progress Notes (Signed)
Date:  05/05/2017   Name:  Rachel Lopez   DOB:  [birth year]   MRN:  [id number]   Chief Complaint: Hypertension and Hyperlipidemia Hypertension  This is a chronic problem. The problem is controlled. Pertinent negatives include no headaches or palpitations. Past treatments include ACE inhibitors. The current treatment provides significant improvement. Identifiable causes of hypertension include a thyroid problem.  Thyroid Problem  Presents for follow-up visit. Patient reports no constipation, diarrhea or palpitations. The symptoms have been stable.  Renal insuff - she only has one kidney.  The last GFR = 33 down from 37.  She is drinking fluids but may not be as hydrated as she could be.  No nausea, edema, itching or other complaint.  Overall feels very well.  Lab Results  Component Value Date   TSH 1.080 10/27/2016   Lab Results  Component Value Date   CREATININE 1.53 (H) 10/27/2016   BUN 19 10/27/2016   NA 137 10/27/2016   K 5.1 10/27/2016   CL 99 10/27/2016   CO2 22 10/27/2016     Review of Systems  Constitutional: Negative for chills, fever and unexpected weight change.  HENT: Negative for congestion and trouble swallowing.   Eyes: Negative for visual disturbance.  Respiratory: Negative for chest tightness and wheezing.   Cardiovascular: Negative for palpitations and leg swelling.  Gastrointestinal: Negative for abdominal pain, constipation and diarrhea.  Genitourinary: Negative for difficulty urinating and dysuria.  Musculoskeletal: Negative for arthralgias.  Neurological: Negative for dizziness and headaches.    Patient Active Problem List   Diagnosis Date Noted  . Chronic renal insufficiency, stage 3 (moderate) ([place]) 04/21/2016  . Angiomyolipoma of kidney 01/01/2015  . HLD (hyperlipidemia) 10/18/2014  . Benign hypertension 10/18/2014  . Hypothyroidism, postablative 10/18/2014  . Intraductal papilloma of left breast 10/18/2014    Prior to Admission  medications   Medication Sig Start Date End Date Taking? Authorizing Provider  atorvastatin (LIPITOR) 20 MG tablet Take 1 tablet (20 mg total) by mouth at bedtime. 10/27/16  Yes Glean Hess, MD  levothyroxine (SYNTHROID, LEVOTHROID) 1 MCG tablet TAKE ONE TABLET BY MOUTH ONCE DAILY 01/25/17  Yes Glean Hess, MD  lisinopril (PRINIVIL,ZESTRIL) 40 MG tablet TAKE ONE TABLET BY MOUTH ONCE DAILY 02/20/17  Yes Glean Hess, MD    No Known Allergies  Past Surgical History:  Procedure Laterality Date  . APPENDECTOMY     54yr  . BREAST BIOPSY Left 10/15/2014   papilloma -u/s bx /clip  . BREAST EXCISIONAL BIOPSY Left 04/2015   papilloma removed-neg  . HEMORRHOID SURGERY  2014  . LAPAROSCOPIC NEPHRECTOMY, HAND ASSISTED Right 04/16/2015   Procedure: HAND ASSISTED LAPAROSCOPIC NEPHRECTOMY;  Surgeon: Nickie Retort, MD;  Location: [facility] ORS;  Service: Urology;  Laterality: Right;  . RADICAL HYSTERECTOMY  1969  . TOTAL THYROIDECTOMY     age 89    Social History   Tobacco Use  . Smoking status: Former Smoker    Packs/day: 0.50    Years: 20.00    Pack years: 10.00    Types: Cigarettes    Last attempt to quit: 02/18/1979    Years since quitting: 38.2  . Smokeless tobacco: Never Used  Substance Use Topics  . Alcohol use: No    Alcohol/week: 1.2 oz    Types: 2 Standard drinks or equivalent per week  . Drug use: No     Medication list has been reviewed and updated.  PHQ 2/9 Scores 10/18/2016 12/30/2014  PHQ -  2 Score 0 0    Physical Exam  Constitutional: She is oriented to person, place, and time. She appears well-developed. No distress.  HENT:  Head: Normocephalic and atraumatic.  Neck: Normal range of motion. Neck supple. Carotid bruit is not present.  Cardiovascular: Normal rate, regular rhythm and normal heart sounds.  Pulmonary/Chest: Effort normal and breath sounds normal. No respiratory distress. She has no wheezes.  Abdominal: Soft. Bowel sounds are normal.    Musculoskeletal: Normal range of motion. She exhibits no edema or tenderness.  Lymphadenopathy:    She has no cervical adenopathy.  Neurological: She is alert and oriented to person, place, and time.  Skin: Skin is warm and dry. No rash noted.  Psychiatric: She has a normal mood and affect. Her behavior is normal. Thought content normal.  Nursing note and vitals reviewed.   BP 110/78   Pulse 79   Ht 5\' 4"  (1.626 m)   Wt 140 lb (63.5 kg)   SpO2 98%   BMI 24.03 kg/m   Assessment and Plan: 1. Chronic renal insufficiency, stage 3 (moderate) ([facility]) Encouraged adequate hydration 64-80 oz per day Avoid nsaids, may take tylenol for joint pain - Renal function panel  2. Benign hypertension controlled  3. Hypothyroidism, postablative supplemented   No orders of the defined types were placed in this encounter.   Partially dictated using Editor, commissioning. Any errors are unintentional.  Halina Maidens, MD [facility]  05/05/2017

## 2017-05-06 LAB — RENAL FUNCTION PANEL
ALBUMIN: 4.7 g/dL (ref 3.5–4.8)
BUN / CREAT RATIO: 15 (ref 12–28)
BUN: 20 mg/dL (ref 8–27)
CALCIUM: 9.5 mg/dL (ref 8.7–10.3)
CO2: 21 mmol/L (ref 20–29)
Chloride: 98 mmol/L (ref 96–106)
Creatinine, Ser: 1.34 mg/dL — ABNORMAL HIGH (ref 0.57–1.00)
GFR calc Af Amer: 45 mL/min/{1.73_m2} — ABNORMAL LOW (ref >59)
GFR calc non Af Amer: 39 mL/min/{1.73_m2} — ABNORMAL LOW (ref >59)
Glucose: 81 mg/dL (ref 65–99)
Phosphorus: 3.5 mg/dL (ref 2.5–4.5)
Potassium: 5.3 mmol/L — ABNORMAL HIGH (ref 3.5–5.2)
Sodium: 138 mmol/L (ref 134–144)

## 2017-07-25 ENCOUNTER — Telehealth: Payer: Self-pay

## 2017-07-25 NOTE — Telephone Encounter (Signed)
11/03/17 AWV rescheduled to 10/19/17. Program no longer available on Thursdays.

## 2017-10-19 ENCOUNTER — Other Ambulatory Visit: Payer: Self-pay

## 2017-10-19 ENCOUNTER — Ambulatory Visit (INDEPENDENT_AMBULATORY_CARE_PROVIDER_SITE_OTHER): Payer: Medicare Other

## 2017-10-19 VITALS — BP 102/60 | HR 90 | Temp 97.7°F | Resp 12 | Ht 64.0 in | Wt 138.2 lb

## 2017-10-19 DIAGNOSIS — D242 Benign neoplasm of left breast: Secondary | ICD-10-CM | POA: Diagnosis not present

## 2017-10-19 DIAGNOSIS — E2839 Other primary ovarian failure: Secondary | ICD-10-CM

## 2017-10-19 DIAGNOSIS — Z1239 Encounter for other screening for malignant neoplasm of breast: Secondary | ICD-10-CM

## 2017-10-19 DIAGNOSIS — Z1231 Encounter for screening mammogram for malignant neoplasm of breast: Secondary | ICD-10-CM

## 2017-10-19 DIAGNOSIS — Z0000 Encounter for general adult medical examination without abnormal findings: Secondary | ICD-10-CM | POA: Diagnosis not present

## 2017-10-19 NOTE — Progress Notes (Signed)
Subjective:   Rachel Lopez is a 77 y.o. female who presents for Medicare Annual (Subsequent) preventive examination.  Review of Systems:  N/A Cardiac Risk Factors include: advanced age (>47men, >85 women);dyslipidemia;hypertension;sedentary lifestyle     Objective:     Vitals: BP 102/60 (BP Location: Right Arm, Patient Position: Sitting, Cuff Size: Normal)   Pulse 90   Temp 97.7 F (36.5 C) (Oral)   Resp 12   Ht 5\' 4"  (1.626 m)   Wt 138 lb 3.2 oz (62.7 kg)   SpO2 97%   BMI 23.72 kg/m   Body mass index is 23.72 kg/m.  Advanced Directives 10/19/2017 10/18/2016 04/16/2015 04/03/2015 03/06/2015 03/04/2015  Does Patient Have a Medical Advance Directive? Yes No Yes - Yes Yes  Type of Paramedic of [place];Living will - Living will Living will [place];Living will Living will;Healthcare Power of Attorney  Does patient want to make changes to medical advance directive? - - No - Patient declined - No - Patient declined -  Copy of [place] in Chart? No - copy requested - No - copy requested No - copy requested No - copy requested -  Would patient like information on creating a medical advance directive? - Yes (MAU/Ambulatory/Procedural Areas - Information given) - - - -    Tobacco Social History   Tobacco Use  Smoking Status Former Smoker  . Packs/day: 0.50  . Years: 15.00  . Pack years: 7.50  . Types: Cigarettes  . Last attempt to quit: 02/18/1979  . Years since quitting: 38.6  Smokeless Tobacco Never Used  Tobacco Comment   smoking cessation materials not required     Counseling given: No Comment: smoking cessation materials not required  Clinical Intake:  Pre-visit preparation completed: Yes  Pain : No/denies pain   BMI - recorded: 23.72 Nutritional Status: BMI of 19-24  Normal Nutritional Risks: None Diabetes: No  How often do you need to have someone help you when you read instructions,  pamphlets, or other written materials from your doctor or pharmacy?: 1 - Never  Interpreter Needed?: No  Information entered by :: AEversole, LPN  Past Medical History:  Diagnosis Date  . Arthritis   . Benign hypertension 10/18/2014  . Chronic kidney disease   . HLD (hyperlipidemia) 10/18/2014  . Hyperlipidemia   . Hypertension   . Hypothyroidism, postablative 10/18/2014  . Papilloma of breast 10/28/2014  . Renal mass, right 01/01/2015  . Thyroid disease    Past Surgical History:  Procedure Laterality Date  . APPENDECTOMY     48yr  . BREAST BIOPSY Left 10/15/2014   papilloma -u/s bx /clip  . BREAST EXCISIONAL BIOPSY Left 04/2015   papilloma removed-neg  . HEMORRHOID SURGERY  2014  . LAPAROSCOPIC NEPHRECTOMY, HAND ASSISTED Right 04/16/2015   Procedure: HAND ASSISTED LAPAROSCOPIC NEPHRECTOMY;  Surgeon: Nickie Retort, MD;  Location: [facility] ORS;  Service: Urology;  Laterality: Right;  . RADICAL HYSTERECTOMY  1969  . TOTAL THYROIDECTOMY     age 48   Family History  Problem Relation Age of Onset  . Prostate cancer Neg Hx   . Breast cancer Neg Hx   . Bladder Cancer Neg Hx   . Kidney cancer Neg Hx    Social History   Socioeconomic History  . Marital status: Widowed    Spouse name: Not on file  . Number of children: 1  . Years of education: Not on file  . Highest education level: 12th  grade  Occupational History  . Occupation: Retired  Scientific laboratory technician  . Financial resource strain: Not hard at all  . Food insecurity:    Worry: Never true    Inability: Never true  . Transportation needs:    Medical: No    Non-medical: No  Tobacco Use  . Smoking status: Former Smoker    Packs/day: 0.50    Years: 15.00    Pack years: 7.50    Types: Cigarettes    Last attempt to quit: 02/18/1979    Years since quitting: 38.6  . Smokeless tobacco: Never Used  . Tobacco comment: smoking cessation materials not required  Substance and Sexual Activity  . Alcohol use: No    Alcohol/week: 1.2  oz    Types: 2 Standard drinks or equivalent per week  . Drug use: No  . Sexual activity: Not Currently  Lifestyle  . Physical activity:    Days per week: 0 days    Minutes per session: 0 min  . Stress: Not at all  Relationships  . Social connections:    Talks on phone: Patient refused    Gets together: Patient refused    Attends religious service: Patient refused    Active member of club or organization: Patient refused    Attends meetings of clubs or organizations: Patient refused    Relationship status: Widowed  Other Topics Concern  . Not on file  Social History Narrative  . Not on file    Outpatient Encounter Medications as of 10/19/2017  Medication Sig  . atorvastatin (LIPITOR) 20 MG tablet Take 1 tablet (20 mg total) by mouth at bedtime.  Marland Kitchen levothyroxine (SYNTHROID, LEVOTHROID) 88 MCG tablet TAKE ONE TABLET BY MOUTH ONCE DAILY  . lisinopril (PRINIVIL,ZESTRIL) 40 MG tablet TAKE ONE TABLET BY MOUTH ONCE DAILY   No facility-administered encounter medications on file as of 10/19/2017.     Activities of Daily Living In your present state of health, do you have any difficulty performing the following activities: 10/19/2017  Hearing? N  Comment denies hearing aids  Vision? N  Comment denies eyeglasses  Difficulty concentrating or making decisions? Y  Comment short term memory loss  Walking or climbing stairs? Y  Comment joint pain  Dressing or bathing? N  Doing errands, shopping? N  Preparing Food and eating ? N  Comment denies dentures  Using the Toilet? N  In the past six months, have you accidently leaked urine? N  Do you have problems with loss of bowel control? N  Managing your Medications? N  Managing your Finances? N  Housekeeping or managing your Housekeeping? N  Some recent data might be hidden    Patient Care Team: Glean Hess, MD as PCP - General (Family Medicine)    Assessment:   This is a routine wellness examination for Rachel Lopez.  Exercise  Activities and Dietary recommendations Current Exercise Habits: The patient does not participate in regular exercise at present, Exercise limited by: None identified  Goals    . Cut out extra servings    . DIET - INCREASE WATER INTAKE     Recommend to drink at least 6-8 8oz glasses of water per day.       Fall Risk Fall Risk  10/19/2017 10/18/2016 12/30/2014  Falls in the past year? No No No  Risk for fall due to : Impaired balance/gait - -  Risk for fall due to: Comment joint pain - -   FALL RISK PREVENTION PERTAINING TO HOME: Is  your home free of loose throw rugs in walkways, pet beds, electrical cords, etc? Yes Is there adequate lighting in your home to reduce risk of falls?  Yes Are there stairs in or around your home WITH handrails? No stairs  ASSISTIVE DEVICES UTILIZED TO PREVENT FALLS: Use of a cane, walker or w/c? No Grab bars in the bathroom? No  Shower chair or a place to sit while bathing? No An elevated toilet seat or a handicapped toilet? No  Timed Get Up and Go Performed: Yes. Pt ambulated 10 feet within 8 sec. Gait slow, steady and without the use of an assistive device. No intervention required at this time. Fall risk prevention has been discussed.  Community Resource Referral:  Pt declined my offer to send [employer] Referral to Care Guide for installation of grab bars in the shower, shower chair or an elevated toilet seat.  Depression Screen PHQ 2/9 Scores 10/19/2017 10/18/2016 12/30/2014  PHQ - 2 Score 0 0 0  PHQ- 9 Score 0 - -     Cognitive Function     6CIT Screen 10/19/2017 10/18/2016 04/21/2016  What Year? 0 points 0 points 0 points  What month? 0 points 0 points 0 points  What time? 0 points 0 points 0 points  Count back from 20 0 points 0 points 0 points  Months in reverse 0 points 0 points 0 points  Repeat phrase 4 points 0 points 0 points  Total Score 4 0 0    Immunization History  Administered Date(s) Administered  . Influenza, High Dose  Seasonal PF 01/21/2017  . Influenza-Unspecified 01/27/2015, 02/15/2017  . Pneumococcal Conjugate-13 09/10/2014  . Pneumococcal Polysaccharide-23 10/18/2016    Qualifies for Shingles Vaccine? Yes. Due for Shingrix. Education has been provided regarding the importance of this vaccine. Pt has been advised to call her insurance company to determine her out of pocket expense. Advised she may also receive this vaccine at her local pharmacy or Health Dept. Verbalized acceptance and understanding.  Due for Tdap vaccine. Education has been provided regarding the importance of this vaccine. Pt has been advised she may receive this vaccine at her local pharmacy or Health Dept. Also advised to provide a copy of her vaccination record if she chooses to receive this vaccine at her local pharmacy. Verbalized acceptance and understanding.  Screening Tests Health Maintenance  Topic Date Due  . DEXA SCAN  01/23/2007  . TETANUS/TDAP  05/04/2018 (Originally 01/22/1960)  . INFLUENZA VACCINE  11/17/2017  . MAMMOGRAM  11/29/2017  . COLONOSCOPY  05/03/2021  . PNA vac Low Risk Adult  Completed    Cancer Screenings: Lung: Low Dose CT Chest recommended if Age 40-80 years, 30 pack-year currently smoking OR have quit w/in 15years. Patient does not qualify. Breast:  Up to date on Mammogram? Yes. Completed 11/29/16. Repeat every year. Ordered today. Message sent to referral coordinator for scheduling purposes AFTER 11/29/17.  Up to date of Bone Density/Dexa? No. Ordered today. Message sent to referral coordinator for scheduling purposes. Colorectal: Completed 05/04/11. Repeat every 10 years  Additional Screenings: Hepatitis C Screening: Does not qualify    Plan:  I have personally reviewed and addressed the Medicare Annual Wellness questionnaire and have noted the following in the patient's chart:  A. Medical and social history B. Use of alcohol, tobacco or illicit drugs  C. Current medications and  supplements D. Functional ability and status E.  Nutritional status F.  Physical activity G. Advance directives H. List of other physicians I.  Hospitalizations, surgeries, and ER visits in previous 12 months J.  [place] such as hearing and vision if needed, cognitive and depression L. Referrals and appointments  In addition, I have reviewed and discussed with patient certain preventive protocols, quality metrics, and best practice recommendations. A written personalized care plan for preventive services as well as general preventive health recommendations were provided to patient.  Signed,  Aleatha Borer, LPN Nurse Health Advisor  MD Recommendations:Due for Shingrix. Education has been provided regarding the importance of this vaccine. Pt has been advised to call her insurance company to determine her out of pocket expense. Advised she may also receive this vaccine at her local pharmacy or Health Dept. Verbalized acceptance and understanding.  Due for Tdap vaccine. Education has been provided regarding the importance of this vaccine. Pt has been advised she may receive this vaccine at her local pharmacy or Health Dept. Also advised to provide a copy of her vaccination record if she chooses to receive this vaccine at her local pharmacy. Verbalized acceptance and understanding.  Mammogram: Completed 11/29/16. Repeat every year. Ordered today. Message sent to referral coordinator for scheduling purposes AFTER 11/29/17.   Bone Density/Dexa: Ordered today. Message sent to referral coordinator for scheduling purposes.

## 2017-10-19 NOTE — Progress Notes (Signed)
As requested, order changed to screening.Marland KitchenMarland [id number].

## 2017-10-19 NOTE — Patient Instructions (Addendum)
Rachel Lopez , Thank you for taking time to come for your Medicare Wellness Visit. I appreciate your ongoing commitment to your health goals. Please review the following plan we discussed and let me know if I can assist you in the future.   Screening recommendations/referrals: Colorectal Screening: Up to date Mammogram: You will receive a call from our office regarding your appointment  Bone Density: You will receive a call from our office regarding your appointment  Vision and Dental Exams: Recommended annual ophthalmology exams for early detection of glaucoma and other disorders of the eye Recommended annual dental exams for proper oral hygiene  Vaccinations: Influenza vaccine: Up to date Pneumococcal vaccine: Up to date Tdap vaccine: Declined. Please call your insurance company to determine your out of pocket expense. You may also receive this vaccine at your local pharmacy or Health Dept. Shingles vaccine: Please call your insurance company to determine your out of pocket expense for the Shingrix vaccine. You may also receive this vaccine at your local pharmacy or Health Dept.  Advanced directives: Please bring a copy of your POA (Power of Attorney) and/or Living Will to your next appointment.  Goals: Recommend to drink at least 6-8 8oz glasses of water per day.  Next appointment: Please schedule your Annual Wellness Visit with your Nurse Health Advisor in one year.  Preventive Care 10 Years and Older, Female Preventive care refers to lifestyle choices and visits with your health care provider that can promote health and wellness. What does preventive care include?  A yearly physical exam. This is also called an annual well check.  Dental exams once or twice a year.  Routine eye exams. Ask your health care provider how often you should have your eyes checked.  Personal lifestyle choices, including:  Daily care of your teeth and gums.  Regular physical activity.  Eating a  healthy diet.  Avoiding tobacco and drug use.  Limiting alcohol use.  Practicing safe sex.  Taking low-dose aspirin every day.  Taking vitamin and mineral supplements as recommended by your health care provider. What happens during an annual well check? The services and screenings done by your health care provider during your annual well check will depend on your age, overall health, lifestyle risk factors, and family history of disease. Counseling  Your health care provider may ask you questions about your:  Alcohol use.  Tobacco use.  Drug use.  Emotional well-being.  Home and relationship well-being.  Sexual activity.  Eating habits.  History of falls.  Memory and ability to understand (cognition).  Work and work Statistician.  Reproductive health. Screening  You may have the following tests or measurements:  Height, weight, and BMI.  Blood pressure.  Lipid and cholesterol levels. These may be checked every 5 years, or more frequently if you are over 55 years old.  Skin check.  Lung cancer screening. You may have this screening every year starting at age 57 if you have a 30-pack-year history of smoking and currently smoke or have quit within the past 15 years.  Fecal occult blood test (FOBT) of the stool. You may have this test every year starting at age 28.  Flexible sigmoidoscopy or colonoscopy. You may have a sigmoidoscopy every 5 years or a colonoscopy every 10 years starting at age 49.  Hepatitis C blood test.  Hepatitis B blood test.  Sexually transmitted disease (STD) testing.  Diabetes screening. This is done by checking your blood sugar (glucose) after you have not eaten for a  while (fasting). You may have this done every 1-3 years.  Bone density scan. This is done to screen for osteoporosis. You may have this done starting at age 30.  Mammogram. This may be done every 1-2 years. Talk to your health care provider about how often you should  have regular mammograms. Talk with your health care provider about your test results, treatment options, and if necessary, the need for more tests. Vaccines  Your health care provider may recommend certain vaccines, such as:  Influenza vaccine. This is recommended every year.  Tetanus, diphtheria, and acellular pertussis (Tdap, Td) vaccine. You may need a Td booster every 10 years.  Zoster vaccine. You may need this after age 43.  Pneumococcal 13-valent conjugate (PCV13) vaccine. One dose is recommended after age 50.  Pneumococcal polysaccharide (PPSV23) vaccine. One dose is recommended after age 38. Talk to your health care provider about which screenings and vaccines you need and how often you need them. This information is not intended to replace advice given to you by your health care provider. Make sure you discuss any questions you have with your health care provider. Document Released: 05/02/2015 Document Revised: 12/24/2015 Document Reviewed: 02/04/2015 Elsevier Interactive Patient Education  2017 [place] Prevention in the Home Falls can cause injuries. They can happen to people of all ages. There are many things you can do to make your home safe and to help prevent falls. What can I do on the outside of my home?  Regularly fix the edges of walkways and driveways and fix any cracks.  Remove anything that might make you trip as you walk through a door, such as a raised step or threshold.  Trim any bushes or trees on the path to your home.  Use bright outdoor lighting.  Clear any walking paths of anything that might make someone trip, such as rocks or tools.  Regularly check to see if handrails are loose or broken. Make sure that both sides of any steps have handrails.  Any raised decks and porches should have guardrails on the edges.  Have any leaves, snow, or ice cleared regularly.  Use sand or salt on walking paths during winter.  Clean up any spills in  your garage right away. This includes oil or grease spills. What can I do in the bathroom?  Use night lights.  Install grab bars by the toilet and in the tub and shower. Do not use towel bars as grab bars.  Use non-skid mats or decals in the tub or shower.  If you need to sit down in the shower, use a plastic, non-slip stool.  Keep the floor dry. Clean up any water that spills on the floor as soon as it happens.  Remove soap buildup in the tub or shower regularly.  Attach bath mats securely with double-sided non-slip rug tape.  Do not have throw rugs and other things on the floor that can make you trip. What can I do in the bedroom?  Use night lights.  Make sure that you have a light by your bed that is easy to reach.  Do not use any sheets or blankets that are too big for your bed. They should not hang down onto the floor.  Have a firm chair that has side arms. You can use this for support while you get dressed.  Do not have throw rugs and other things on the floor that can make you trip. What can I do in the  kitchen?  Clean up any spills right away.  Avoid walking on wet floors.  Keep items that you use a lot in easy-to-reach places.  If you need to reach something above you, use a strong step stool that has a grab bar.  Keep electrical cords out of the way.  Do not use floor polish or wax that makes floors slippery. If you must use wax, use non-skid floor wax.  Do not have throw rugs and other things on the floor that can make you trip. What can I do with my stairs?  Do not leave any items on the stairs.  Make sure that there are handrails on both sides of the stairs and use them. Fix handrails that are broken or loose. Make sure that handrails are as long as the stairways.  Check any carpeting to make sure that it is firmly attached to the stairs. Fix any carpet that is loose or worn.  Avoid having throw rugs at the top or bottom of the stairs. If you do have  throw rugs, attach them to the floor with carpet tape.  Make sure that you have a light switch at the top of the stairs and the bottom of the stairs. If you do not have them, ask someone to add them for you. What else can I do to help prevent falls?  Wear shoes that:  Do not have high heels.  Have rubber bottoms.  Are comfortable and fit you well.  Are closed at the toe. Do not wear sandals.  If you use a stepladder:  Make sure that it is fully opened. Do not climb a closed stepladder.  Make sure that both sides of the stepladder are locked into place.  Ask someone to hold it for you, if possible.  Clearly mark and make sure that you can see:  Any grab bars or handrails.  First and last steps.  Where the edge of each step is.  Use tools that help you move around (mobility aids) if they are needed. These include:  Canes.  Walkers.  Scooters.  Crutches.  Turn on the lights when you go into a dark area. Replace any light bulbs as soon as they burn out.  Set up your furniture so you have a clear path. Avoid moving your furniture around.  If any of your floors are uneven, fix them.  If there are any pets around you, be aware of where they are.  Review your medicines with your doctor. Some medicines can make you feel dizzy. This can increase your chance of falling. Ask your doctor what other things that you can do to help prevent falls. This information is not intended to replace advice given to you by your health care provider. Make sure you discuss any questions you have with your health care provider. Document Released: 01/30/2009 Document Revised: 09/11/2015 Document Reviewed: 05/10/2014 Elsevier Interactive Patient Education  2017 [employer]  

## 2017-11-03 ENCOUNTER — Encounter: Payer: Self-pay | Admitting: Internal Medicine

## 2017-11-03 ENCOUNTER — Ambulatory Visit: Payer: PRIVATE HEALTH INSURANCE

## 2017-11-03 ENCOUNTER — Ambulatory Visit (INDEPENDENT_AMBULATORY_CARE_PROVIDER_SITE_OTHER): Payer: Medicare Other | Admitting: Internal Medicine

## 2017-11-03 VITALS — BP 112/68 | HR 88 | Ht 64.0 in | Wt 137.0 lb

## 2017-11-03 DIAGNOSIS — E782 Mixed hyperlipidemia: Secondary | ICD-10-CM

## 2017-11-03 DIAGNOSIS — E890 Postprocedural hypothyroidism: Secondary | ICD-10-CM

## 2017-11-03 DIAGNOSIS — N183 Chronic kidney disease, stage 3 unspecified: Secondary | ICD-10-CM

## 2017-11-03 DIAGNOSIS — Z0001 Encounter for general adult medical examination with abnormal findings: Secondary | ICD-10-CM | POA: Diagnosis not present

## 2017-11-03 DIAGNOSIS — M1711 Unilateral primary osteoarthritis, right knee: Secondary | ICD-10-CM

## 2017-11-03 DIAGNOSIS — Z0000 Encounter for general adult medical examination without abnormal findings: Secondary | ICD-10-CM

## 2017-11-03 DIAGNOSIS — I10 Essential (primary) hypertension: Secondary | ICD-10-CM | POA: Diagnosis not present

## 2017-11-03 DIAGNOSIS — Z1231 Encounter for screening mammogram for malignant neoplasm of breast: Secondary | ICD-10-CM

## 2017-11-03 NOTE — Progress Notes (Signed)
Date:  11/03/2017   Name:  Rachel Lopez   DOB:  29-Jun-1940   MRN:  [id number]   Chief Complaint: Annual Exam (Breast Exam. ) Rachel Lopez is a 77 y.o. female who presents today for her Complete Annual Exam. She feels well. She reports exercising walking the dog daily. She reports she is sleeping well. She has no breast complaints.  Hypertension  This is a chronic problem. The problem is controlled. Pertinent negatives include no chest pain, headaches, palpitations or shortness of breath. Past treatments include ACE inhibitors. The current treatment provides significant improvement. Identifiable causes of hypertension include a thyroid problem.  Hyperlipidemia  This is a chronic problem. The problem is controlled. Pertinent negatives include no chest pain or shortness of breath. Current antihyperlipidemic treatment includes statins. The current treatment provides significant improvement of lipids.  Thyroid Problem  Presents for follow-up visit. Patient reports no anxiety, constipation, depressed mood, diarrhea, fatigue, palpitations, tremors or weight gain. The symptoms have been stable. Her past medical history is significant for hyperlipidemia.      Review of Systems  Constitutional: Negative for chills, fatigue, fever and weight gain.  HENT: Negative for congestion, hearing loss, tinnitus, trouble swallowing and voice change.   Eyes: Negative for visual disturbance.  Respiratory: Negative for cough, chest tightness, shortness of breath and wheezing.   Cardiovascular: Negative for chest pain, palpitations and leg swelling.  Gastrointestinal: Negative for abdominal pain, constipation, diarrhea and vomiting.  Endocrine: Negative for polydipsia and polyuria.  Genitourinary: Negative for dysuria, frequency, genital sores, vaginal bleeding and vaginal discharge.  Musculoskeletal: Negative for arthralgias, gait problem and joint swelling.  Skin: Negative for color change and rash.    Allergic/Immunologic: Negative for environmental allergies and food allergies.  Neurological: Negative for dizziness, tremors, light-headedness and headaches.  Hematological: Negative for adenopathy. Does not bruise/bleed easily.  Psychiatric/Behavioral: Negative for dysphoric mood and sleep disturbance. The patient is not nervous/anxious.     Patient Active Problem List   Diagnosis Date Noted  . Chronic renal insufficiency, stage 3 (moderate) ([place]) 04/21/2016  . Angiomyolipoma of kidney 01/01/2015  . HLD (hyperlipidemia) 10/18/2014  . Benign hypertension 10/18/2014  . Hypothyroidism, postablative 10/18/2014  . Intraductal papilloma of left breast 10/18/2014    Prior to Admission medications   Medication Sig Start Date End Date Taking? Authorizing Provider  atorvastatin (LIPITOR) 20 MG tablet Take 1 tablet (20 mg total) by mouth at bedtime. 10/27/16  Yes Glean Hess, MD  levothyroxine (SYNTHROID, LEVOTHROID) 70 MCG tablet TAKE ONE TABLET BY MOUTH ONCE DAILY 01/25/17  Yes Glean Hess, MD  lisinopril (PRINIVIL,ZESTRIL) 40 MG tablet TAKE ONE TABLET BY MOUTH ONCE DAILY 02/20/17  Yes Glean Hess, MD    No Known Allergies  Past Surgical History:  Procedure Laterality Date  . APPENDECTOMY     29yr  . BREAST BIOPSY Left 10/15/2014   papilloma -u/s bx /clip  . BREAST EXCISIONAL BIOPSY Left 04/2015   papilloma removed-neg  . HEMORRHOID SURGERY  2014  . LAPAROSCOPIC NEPHRECTOMY, HAND ASSISTED Right 04/16/2015   Procedure: HAND ASSISTED LAPAROSCOPIC NEPHRECTOMY;  Surgeon: Nickie Retort, MD;  Location: [facility] ORS;  Service: Urology;  Laterality: Right;  . RADICAL HYSTERECTOMY  1969  . TOTAL THYROIDECTOMY     age 51    Social History   Tobacco Use  . Smoking status: Former Smoker    Packs/day: 0.50    Years: 15.00    Pack years: 7.50    Types:  Cigarettes    Last attempt to quit: 02/18/1979    Years since quitting: 38.7  . Smokeless tobacco: Never Used  .  Tobacco comment: smoking cessation materials not required  Substance Use Topics  . Alcohol use: No    Alcohol/week: 1.2 oz    Types: 2 Standard drinks or equivalent per week  . Drug use: No     Medication list has been reviewed and updated.  Current Meds  Medication Sig  . atorvastatin (LIPITOR) 20 MG tablet Take 1 tablet (20 mg total) by mouth at bedtime.  Marland Kitchen levothyroxine (SYNTHROID, LEVOTHROID) 88 MCG tablet TAKE ONE TABLET BY MOUTH ONCE DAILY  . lisinopril (PRINIVIL,ZESTRIL) 40 MG tablet TAKE ONE TABLET BY MOUTH ONCE DAILY    PHQ 2/9 Scores 10/19/2017 10/18/2016 12/30/2014  PHQ - 2 Score 0 0 0  PHQ- 9 Score 0 - -    Physical Exam  Constitutional: She is oriented to person, place, and time. She appears well-developed and well-nourished. No distress.  HENT:  Head: Normocephalic and atraumatic.  Right Ear: Tympanic membrane and ear canal normal.  Left Ear: Tympanic membrane and ear canal normal.  Nose: Right sinus exhibits no maxillary sinus tenderness. Left sinus exhibits no maxillary sinus tenderness.  Mouth/Throat: Uvula is midline and oropharynx is clear and moist.  Eyes: Conjunctivae and EOM are normal. Right eye exhibits no discharge. Left eye exhibits no discharge. No scleral icterus.  Neck: Normal range of motion. Carotid bruit is not present. No erythema present. No thyromegaly present.  Cardiovascular: Normal rate, regular rhythm, normal heart sounds and normal pulses.  Pulmonary/Chest: Effort normal. No respiratory distress. She has no wheezes. Right breast exhibits no mass, no nipple discharge, no skin change and no tenderness. Left breast exhibits no mass, no nipple discharge, no skin change and no tenderness.  Abdominal: Soft. Bowel sounds are normal. There is no hepatosplenomegaly. There is no tenderness. There is no CVA tenderness.  Musculoskeletal: She exhibits no edema or tenderness.       Right knee: She exhibits decreased range of motion. She exhibits no swelling  and no effusion.       Left knee: She exhibits decreased range of motion. She exhibits no swelling and no effusion.  Lymphadenopathy:    She has no cervical adenopathy.    She has no axillary adenopathy.  Neurological: She is alert and oriented to person, place, and time. She has normal reflexes. No cranial nerve deficit or sensory deficit.  Skin: Skin is warm, dry and intact. No rash noted.  Psychiatric: She has a normal mood and affect. Her speech is normal and behavior is normal. Thought content normal.  Nursing note and vitals reviewed.   BP 112/68   Pulse 88   Ht 5\' 4"  (1.626 m)   Wt 137 lb (62.1 kg)   SpO2 96%   BMI 23.52 kg/m   Assessment and Plan: 1. Annual physical exam Normal exam  2. Encounter for screening mammogram for breast cancer Mammogram and DEXA scheduled  3. Benign hypertension controlled - CBC with Differential/Platelet  4. Chronic renal insufficiency, stage 3 (moderate) ([facility]) stable - Comprehensive metabolic panel  5. Mixed hyperlipidemia On statin therapy - Lipid panel  6. Hypothyroidism, postablative supplemented - TSH  7. Arthritis of knee, right Continue modified activities May take tylenol if needed but avoid nsaids   No orders of the defined types were placed in this encounter.   Partially dictated using Editor, commissioning. Any errors are unintentional.  Halina Maidens,  MD [facility]  11/03/2017   There are no diagnoses linked to this encounter.

## 2017-11-03 NOTE — Patient Instructions (Signed)
Health Maintenance for Postmenopausal Women Menopause is a normal process in which your reproductive ability comes to an end. This process happens gradually over a span of months to years, usually between the ages of 22 and 9. Menopause is complete when you have missed 12 consecutive menstrual periods. It is important to talk with your health care provider about some of the most common conditions that affect postmenopausal women, such as heart disease, cancer, and bone loss (osteoporosis). Adopting a healthy lifestyle and getting preventive care can help to promote your health and wellness. Those actions can also lower your chances of developing some of these common conditions. What should I know about menopause? During menopause, you may experience a number of symptoms, such as:  Moderate-to-severe hot flashes.  Night sweats.  Decrease in sex drive.  Mood swings.  Headaches.  Tiredness.  Irritability.  Memory problems.  Insomnia.  Choosing to treat or not to treat menopausal changes is an individual decision that you make with your health care provider. What should I know about hormone replacement therapy and supplements? Hormone therapy products are effective for treating symptoms that are associated with menopause, such as hot flashes and night sweats. Hormone replacement carries certain risks, especially as you become older. If you are thinking about using estrogen or estrogen with progestin treatments, discuss the benefits and risks with your health care provider. What should I know about heart disease and stroke? Heart disease, heart attack, and stroke become more likely as you age. This may be due, in part, to the hormonal changes that your body experiences during menopause. These can affect how your body processes dietary fats, triglycerides, and cholesterol. Heart attack and stroke are both medical emergencies. There are many things that you can do to help prevent heart disease  and stroke:  Have your blood pressure checked at least every 1-2 years. High blood pressure causes heart disease and increases the risk of stroke.  If you are 53-22 years old, ask your health care provider if you should take aspirin to prevent a heart attack or a stroke.  Do not use any tobacco products, including cigarettes, chewing tobacco, or electronic cigarettes. If you need help quitting, ask your health care provider.  It is important to eat a healthy diet and maintain a healthy weight. ? Be sure to include plenty of vegetables, fruits, low-fat dairy products, and lean protein. ? Avoid eating foods that are high in solid fats, added sugars, or salt (sodium).  Get regular exercise. This is one of the most important things that you can do for your health. ? Try to exercise for at least 150 minutes each week. The type of exercise that you do should increase your heart rate and make you sweat. This is known as moderate-intensity exercise. ? Try to do strengthening exercises at least twice each week. Do these in addition to the moderate-intensity exercise.  Know your numbers.Ask your health care provider to check your cholesterol and your blood glucose. Continue to have your blood tested as directed by your health care provider.  What should I know about cancer screening? There are several types of cancer. Take the following steps to reduce your risk and to catch any cancer development as early as possible. Breast Cancer  Practice breast self-awareness. ? This means understanding how your breasts normally appear and feel. ? It also means doing regular breast self-exams. Let your health care provider know about any changes, no matter how small.  If you are 40  or older, have a clinician do a breast exam (clinical breast exam or CBE) every year. Depending on your age, family history, and medical history, it may be recommended that you also have a yearly breast X-ray (mammogram).  If you  have a family history of breast cancer, talk with your health care provider about genetic screening.  If you are at high risk for breast cancer, talk with your health care provider about having an MRI and a mammogram every year.  Breast cancer (BRCA) gene test is recommended for women who have family members with BRCA-related cancers. Results of the assessment will determine the need for genetic counseling and BRCA1 and for BRCA2 testing. BRCA-related cancers include these types: ? Breast. This occurs in males or females. ? Ovarian. ? Tubal. This may also be called fallopian tube cancer. ? Cancer of the abdominal or pelvic lining (peritoneal cancer). ? Prostate. ? Pancreatic.  Cervical, Uterine, and Ovarian Cancer Your health care provider may recommend that you be screened regularly for cancer of the pelvic organs. These include your ovaries, uterus, and vagina. This screening involves a pelvic exam, which includes checking for microscopic changes to the surface of your cervix (Pap test).  For women ages 21-65, health care providers may recommend a pelvic exam and a Pap test every three years. For women ages 79-65, they may recommend the Pap test and pelvic exam, combined with testing for human papilloma virus (HPV), every five years. Some types of HPV increase your risk of cervical cancer. Testing for HPV may also be done on women of any age who have unclear Pap test results.  Other health care providers may not recommend any screening for nonpregnant women who are considered low risk for pelvic cancer and have no symptoms. Ask your health care provider if a screening pelvic exam is right for you.  If you have had past treatment for cervical cancer or a condition that could lead to cancer, you need Pap tests and screening for cancer for at least 20 years after your treatment. If Pap tests have been discontinued for you, your risk factors (such as having a new sexual partner) need to be  reassessed to determine if you should start having screenings again. Some women have medical problems that increase the chance of getting cervical cancer. In these cases, your health care provider may recommend that you have screening and Pap tests more often.  If you have a family history of uterine cancer or ovarian cancer, talk with your health care provider about genetic screening.  If you have vaginal bleeding after reaching menopause, tell your health care provider.  There are currently no reliable tests available to screen for ovarian cancer.  Lung Cancer Lung cancer screening is recommended for adults 69-62 years old who are at high risk for lung cancer because of a history of smoking. A yearly low-dose CT scan of the lungs is recommended if you:  Currently smoke.  Have a history of at least 30 pack-years of smoking and you currently smoke or have quit within the past 15 years. A pack-year is smoking an average of one pack of cigarettes per day for one year.  Yearly screening should:  Continue until it has been 15 years since you quit.  Stop if you develop a health problem that would prevent you from having lung cancer treatment.  Colorectal Cancer  This type of cancer can be detected and can often be prevented.  Routine colorectal cancer screening usually begins at  age 42 and continues through age 45.  If you have risk factors for colon cancer, your health care provider may recommend that you be screened at an earlier age.  If you have a family history of colorectal cancer, talk with your health care provider about genetic screening.  Your health care provider may also recommend using home test kits to check for hidden blood in your stool.  A small camera at the end of a tube can be used to examine your colon directly (sigmoidoscopy or colonoscopy). This is done to check for the earliest forms of colorectal cancer.  Direct examination of the colon should be repeated every  5-10 years until age 71. However, if early forms of precancerous polyps or small growths are found or if you have a family history or genetic risk for colorectal cancer, you may need to be screened more often.  Skin Cancer  Check your skin from head to toe regularly.  Monitor any moles. Be sure to tell your health care provider: ? About any new moles or changes in moles, especially if there is a change in a mole's shape or color. ? If you have a mole that is larger than the size of a pencil eraser.  If any of your family members has a history of skin cancer, especially at a young age, talk with your health care provider about genetic screening.  Always use sunscreen. Apply sunscreen liberally and repeatedly throughout the day.  Whenever you are outside, protect yourself by wearing long sleeves, pants, a wide-brimmed hat, and sunglasses.  What should I know about osteoporosis? Osteoporosis is a condition in which bone destruction happens more quickly than new bone creation. After menopause, you may be at an increased risk for osteoporosis. To help prevent osteoporosis or the bone fractures that can happen because of osteoporosis, the following is recommended:  If you are 46-71 years old, get at least 1,000 mg of calcium and at least 600 mg of vitamin D per day.  If you are older than age 55 but younger than age 65, get at least 1,200 mg of calcium and at least 600 mg of vitamin D per day.  If you are older than age 54, get at least 1,200 mg of calcium and at least 800 mg of vitamin D per day.  Smoking and excessive alcohol intake increase the risk of osteoporosis. Eat foods that are rich in calcium and vitamin D, and do weight-bearing exercises several times each week as directed by your health care provider. What should I know about how menopause affects my mental health? Depression may occur at any age, but it is more common as you become older. Common symptoms of depression  include:  Low or sad mood.  Changes in sleep patterns.  Changes in appetite or eating patterns.  Feeling an overall lack of motivation or enjoyment of activities that you previously enjoyed.  Frequent crying spells.  Talk with your health care provider if you think that you are experiencing depression. What should I know about immunizations? It is important that you get and maintain your immunizations. These include:  Tetanus, diphtheria, and pertussis (Tdap) booster vaccine.  Influenza every year before the flu season begins.  Pneumonia vaccine.  Shingles vaccine.  Your health care provider may also recommend other immunizations. This information is not intended to replace advice given to you by your health care provider. Make sure you discuss any questions you have with your health care provider. Document Released: 05/28/2005  Document Revised: 10/24/2015 Document Reviewed: 01/07/2015 Elsevier Interactive Patient Education  2018 [employer]  

## 2017-11-04 LAB — COMPREHENSIVE METABOLIC PANEL
ALBUMIN: 4.6 g/dL (ref 3.5–4.8)
ALT: 9 IU/L (ref 0–32)
AST: 15 IU/L (ref 0–40)
Albumin/Globulin Ratio: 1.9 (ref 1.2–2.2)
Alkaline Phosphatase: 125 IU/L — ABNORMAL HIGH (ref 39–117)
BUN/Creatinine Ratio: 13 (ref 12–28)
BUN: 20 mg/dL (ref 8–27)
Bilirubin Total: 0.4 mg/dL (ref 0.0–1.2)
CALCIUM: 9.1 mg/dL (ref 8.7–10.3)
CO2: 22 mmol/L (ref 20–29)
CREATININE: 1.52 mg/dL — AB (ref 0.57–1.00)
Chloride: 97 mmol/L (ref 96–106)
GFR, EST AFRICAN AMERICAN: 38 mL/min/{1.73_m2} — AB (ref >59)
GFR, EST NON AFRICAN AMERICAN: 33 mL/min/{1.73_m2} — AB (ref >59)
GLUCOSE: 73 mg/dL (ref 65–99)
Globulin, Total: 2.4 g/dL (ref 1.5–4.5)
Potassium: 5.1 mmol/L (ref 3.5–5.2)
Sodium: 136 mmol/L (ref 134–144)
TOTAL PROTEIN: 7 g/dL (ref 6.0–8.5)

## 2017-11-04 LAB — CBC WITH DIFFERENTIAL/PLATELET
BASOS ABS: 0.1 10*3/uL (ref 0.0–0.2)
Basos: 1 %
EOS (ABSOLUTE): 0.2 10*3/uL (ref 0.0–0.4)
Eos: 3 %
HEMOGLOBIN: 14.5 g/dL (ref 11.1–15.9)
Hematocrit: 43.5 % (ref 34.0–46.6)
IMMATURE GRANS (ABS): 0 10*3/uL (ref 0.0–0.1)
IMMATURE GRANULOCYTES: 0 %
LYMPHS: 11 %
Lymphocytes Absolute: 0.8 10*3/uL (ref 0.7–3.1)
MCH: 29.5 pg (ref 26.6–33.0)
MCHC: 33.3 g/dL (ref 31.5–35.7)
MCV: 89 fL (ref 79–97)
MONOCYTES: 8 %
Monocytes Absolute: 0.6 10*3/uL (ref 0.1–0.9)
NEUTROS ABS: 5.6 10*3/uL (ref 1.4–7.0)
NEUTROS PCT: 77 %
PLATELETS: 286 10*3/uL (ref 150–450)
RBC: 4.91 x10E6/uL (ref 3.77–5.28)
RDW: 12.2 % — ABNORMAL LOW (ref 12.3–15.4)
WBC: 7.3 10*3/uL (ref 3.4–10.8)

## 2017-11-04 LAB — LIPID PANEL
CHOL/HDL RATIO: 4 ratio (ref 0.0–4.4)
CHOLESTEROL TOTAL: 185 mg/dL (ref 100–199)
HDL: 46 mg/dL (ref >39)
LDL CALC: 105 mg/dL — AB (ref 0–99)
TRIGLYCERIDES: 171 mg/dL — AB (ref 0–149)
VLDL CHOLESTEROL CAL: 34 mg/dL (ref 5–40)

## 2017-11-04 LAB — TSH: TSH: 0.353 u[IU]/mL — ABNORMAL LOW (ref 0.450–4.500)

## 2017-11-26 NOTE — Telephone Encounter (Signed)
sched for 10/25/18

## 2017-11-30 ENCOUNTER — Ambulatory Visit
Admission: RE | Admit: 2017-11-30 | Discharge: 2017-11-30 | Disposition: A | Discharge status: Home / Self Care | Payer: Medicare Other | Source: Ambulatory Visit | Attending: Internal Medicine | Admitting: Internal Medicine

## 2017-11-30 DIAGNOSIS — Z1231 Encounter for screening mammogram for malignant neoplasm of breast: Secondary | ICD-10-CM | POA: Insufficient documentation

## 2017-11-30 DIAGNOSIS — D242 Benign neoplasm of left breast: Secondary | ICD-10-CM | POA: Insufficient documentation

## 2017-11-30 DIAGNOSIS — Z1239 Encounter for other screening for malignant neoplasm of breast: Secondary | ICD-10-CM

## 2017-11-30 DIAGNOSIS — Z780 Asymptomatic menopausal state: Secondary | ICD-10-CM | POA: Diagnosis not present

## 2017-11-30 DIAGNOSIS — E2839 Other primary ovarian failure: Secondary | ICD-10-CM | POA: Diagnosis not present

## 2017-11-30 DIAGNOSIS — M85851 Other specified disorders of bone density and structure, right thigh: Secondary | ICD-10-CM | POA: Diagnosis not present

## 2017-11-30 DIAGNOSIS — M810 Age-related osteoporosis without current pathological fracture: Secondary | ICD-10-CM | POA: Diagnosis not present

## 2017-12-20 ENCOUNTER — Other Ambulatory Visit: Payer: Self-pay | Admitting: Internal Medicine

## 2017-12-20 DIAGNOSIS — E782 Mixed hyperlipidemia: Secondary | ICD-10-CM

## 2018-01-12 ENCOUNTER — Other Ambulatory Visit: Payer: Self-pay | Admitting: Internal Medicine

## 2018-01-12 DIAGNOSIS — I10 Essential (primary) hypertension: Secondary | ICD-10-CM

## 2018-05-10 ENCOUNTER — Ambulatory Visit (INDEPENDENT_AMBULATORY_CARE_PROVIDER_SITE_OTHER): Payer: Medicare Other | Admitting: Internal Medicine

## 2018-05-10 ENCOUNTER — Encounter: Payer: Self-pay | Admitting: Internal Medicine

## 2018-05-10 VITALS — BP 132/80 | HR 90 | Ht 64.0 in | Wt 139.0 lb

## 2018-05-10 DIAGNOSIS — I10 Essential (primary) hypertension: Secondary | ICD-10-CM

## 2018-05-10 DIAGNOSIS — N183 Chronic kidney disease, stage 3 unspecified: Secondary | ICD-10-CM

## 2018-05-10 DIAGNOSIS — E890 Postprocedural hypothyroidism: Secondary | ICD-10-CM | POA: Diagnosis not present

## 2018-05-10 NOTE — Progress Notes (Signed)
Date:  05/10/2018   Name:  Rachel Lopez   DOB:  1940/07/22   MRN:  [id number]   Chief Complaint: Hypertension (6 month follow up.)  Hypertension  This is a chronic problem. The problem is controlled. Pertinent negatives include no chest pain, headaches, palpitations or shortness of breath. Past treatments include ACE inhibitors. Hypertensive end-organ damage includes kidney disease. Identifiable causes of hypertension include a thyroid problem.  Thyroid Problem  Presents for follow-up visit. Patient reports no fatigue, palpitations or tremors. The symptoms have been stable.  Renal insuff - stable GFR, avoiding nsaids.  Lab Results  Component Value Date   CREATININE 1.52 (H) 11/03/2017   BUN 20 11/03/2017   NA 136 11/03/2017   K 5.1 11/03/2017   CL 97 11/03/2017   CO2 22 11/03/2017    Review of Systems  Constitutional: Negative for appetite change, fatigue, fever and unexpected weight change.  HENT: Negative for tinnitus and trouble swallowing.   Eyes: Negative for visual disturbance.  Respiratory: Negative for cough, chest tightness and shortness of breath.   Cardiovascular: Negative for chest pain, palpitations and leg swelling.  Gastrointestinal: Negative for abdominal pain.  Endocrine: Negative for polydipsia and polyuria.  Genitourinary: Negative for dysuria and hematuria.  Musculoskeletal: Negative for arthralgias.  Allergic/Immunologic: Negative for environmental allergies.  Neurological: Negative for dizziness, tremors, numbness and headaches.  Psychiatric/Behavioral: Negative for dysphoric mood.    Patient Active Problem List   Diagnosis Date Noted  . Arthritis of knee, right 11/03/2017  . Chronic renal insufficiency, stage 3 (moderate) ([place]) 04/21/2016  . Angiomyolipoma of kidney 01/01/2015  . HLD (hyperlipidemia) 10/18/2014  . Benign hypertension 10/18/2014  . Hypothyroidism, postablative 10/18/2014  . Intraductal papilloma of left breast 10/18/2014     No Known Allergies  Past Surgical History:  Procedure Laterality Date  . APPENDECTOMY     44yr  . BREAST BIOPSY Left 10/15/2014   papilloma -u/s bx /clip  . BREAST EXCISIONAL BIOPSY Left 04/2015   papilloma removed-neg  . HEMORRHOID SURGERY  2014  . LAPAROSCOPIC NEPHRECTOMY, HAND ASSISTED Right 04/16/2015   Procedure: HAND ASSISTED LAPAROSCOPIC NEPHRECTOMY;  Surgeon: Nickie Retort, MD;  Location: [facility] ORS;  Service: Urology;  Laterality: Right;  . RADICAL HYSTERECTOMY  1969  . TOTAL THYROIDECTOMY     age 29    Social History   Tobacco Use  . Smoking status: Former Smoker    Packs/day: 0.50    Years: 15.00    Pack years: 7.50    Types: Cigarettes    Last attempt to quit: 02/18/1979    Years since quitting: 39.2  . Smokeless tobacco: Never Used  . Tobacco comment: smoking cessation materials not required  Substance Use Topics  . Alcohol use: No    Alcohol/week: 2.0 standard drinks    Types: 2 Standard drinks or equivalent per week  . Drug use: No     Medication list has been reviewed and updated.  Current Meds  Medication Sig  . atorvastatin (LIPITOR) 20 MG tablet TAKE 1 TABLET BY MOUTH AT BEDTIME  . levothyroxine (SYNTHROID, LEVOTHROID) 88 MCG tablet TAKE 1 TABLET BY MOUTH EVERY DAY  . lisinopril (PRINIVIL,ZESTRIL) 40 MG tablet TAKE 1 TABLET BY MOUTH DAILY    PHQ 2/9 Scores 05/10/2018 10/19/2017 10/18/2016 12/30/2014  PHQ - 2 Score 0 0 0 0  PHQ- 9 Score - 0 - -   Wt Readings from Last 3 Encounters:  05/10/18 139 lb (63 kg)  11/03/17 137 lb (  62.1 kg)  10/19/17 138 lb 3.2 oz (62.7 kg)    Physical Exam Vitals signs and nursing note reviewed.  Constitutional:      General: She is not in acute distress.    Appearance: She is well-developed.  HENT:     Head: Normocephalic and atraumatic.  Neck:     Musculoskeletal: Normal range of motion and neck supple.     Vascular: No carotid bruit.  Cardiovascular:     Rate and Rhythm: Normal rate and regular  rhythm.     Pulses: Normal pulses.     Heart sounds: Normal heart sounds.  Pulmonary:     Effort: Pulmonary effort is normal. No respiratory distress.     Breath sounds: Normal breath sounds.  Musculoskeletal: Normal range of motion.     Right lower leg: No edema.     Left lower leg: No edema.  Lymphadenopathy:     Cervical: No cervical adenopathy.  Skin:    General: Skin is warm and dry.     Findings: No rash.  Neurological:     Mental Status: She is alert and oriented to person, place, and time.  Psychiatric:        Behavior: Behavior normal.        Thought Content: Thought content normal.     BP 132/80   Pulse 90   Ht 5\' 4"  (1.626 m)   Wt 139 lb (63 kg)   SpO2 97%   BMI 23.86 kg/m   Assessment and Plan: 1. Benign hypertension Controlled, continue current therapy  2. Hypothyroidism, postablative supplemented  3. Chronic renal insufficiency, stage 3 (moderate) ([facility]) Continue avoid nsaids, recheck every 6 mo - Basic metabolic panel   Partially dictated using Editor, commissioning. Any errors are unintentional.  Halina Maidens, MD [facility]  05/10/2018

## 2018-05-11 LAB — BASIC METABOLIC PANEL
BUN/Creatinine Ratio: 14 (ref 12–28)
BUN: 20 mg/dL (ref 8–27)
CALCIUM: 9 mg/dL (ref 8.7–10.3)
CHLORIDE: 100 mmol/L (ref 96–106)
CO2: 20 mmol/L (ref 20–29)
Creatinine, Ser: 1.38 mg/dL — ABNORMAL HIGH (ref 0.57–1.00)
GFR calc Af Amer: 43 mL/min/{1.73_m2} — ABNORMAL LOW (ref >59)
GFR, EST NON AFRICAN AMERICAN: 37 mL/min/{1.73_m2} — AB (ref >59)
Glucose: 83 mg/dL (ref 65–99)
POTASSIUM: 4.6 mmol/L (ref 3.5–5.2)
SODIUM: 138 mmol/L (ref 134–144)

## 2018-10-25 ENCOUNTER — Ambulatory Visit (INDEPENDENT_AMBULATORY_CARE_PROVIDER_SITE_OTHER): Payer: Medicare Other

## 2018-10-25 ENCOUNTER — Other Ambulatory Visit: Payer: Self-pay

## 2018-10-25 VITALS — BP 112/80 | HR 101 | Temp 97.2°F | Resp 16 | Ht 64.0 in | Wt 136.4 lb

## 2018-10-25 DIAGNOSIS — Z0000 Encounter for general adult medical examination without abnormal findings: Secondary | ICD-10-CM

## 2018-10-25 DIAGNOSIS — Z1231 Encounter for screening mammogram for malignant neoplasm of breast: Secondary | ICD-10-CM | POA: Diagnosis not present

## 2018-10-25 NOTE — Patient Instructions (Signed)
Rachel Lopez , Thank you for taking time to come for your Medicare Wellness Visit. I appreciate your ongoing commitment to your health goals. Please review the following plan we discussed and let me know if I can assist you in the future.   Screening recommendations/referrals: Colonoscopy: done 05/04/11.  Mammogram: done 11/30/17. Please call [phone] to schedule your mammogram.  Bone Density: done 11/30/17 Recommended yearly ophthalmology/optometry visit for glaucoma screening and checkup Recommended yearly dental visit for hygiene and checkup  Vaccinations: Influenza vaccine: done 02/15/18 Pneumococcal vaccine: done 10/18/16 Tdap vaccine: due - please contact us if you get a cut or scrape  Shingles vaccine: Shingrix discussed. Please contact your pharmacy for coverage information.     Advanced directives: Advance directive discussed with you today. I have provided a copy for you to complete at home and have notarized. Once this is complete please bring a copy in to our office so we can scan it into your chart.  Conditions/risks identified: recommend drinking 6-8 glasses of water per day  Next appointment: Please follow up in one year for your Medicare Annual Wellness visit.     Preventive Care 19 Years and Older, Female Preventive care refers to lifestyle choices and visits with your health care provider that can promote health and wellness. What does preventive care include?  A yearly physical exam. This is also called an annual well check.  Dental exams once or twice a year.  Routine eye exams. Ask your health care provider how often you should have your eyes checked.  Personal lifestyle choices, including:  Daily care of your teeth and gums.  Regular physical activity.  Eating a healthy diet.  Avoiding tobacco and drug use.  Limiting alcohol use.  Practicing safe sex.  Taking low-dose aspirin every day.  Taking vitamin and mineral supplements as recommended by  your health care provider. What happens during an annual well check? The services and screenings done by your health care provider during your annual well check will depend on your age, overall health, lifestyle risk factors, and family history of disease. Counseling  Your health care provider may ask you questions about your:  Alcohol use.  Tobacco use.  Drug use.  Emotional well-being.  Home and relationship well-being.  Sexual activity.  Eating habits.  History of falls.  Memory and ability to understand (cognition).  Work and work Statistician.  Reproductive health. Screening  You may have the following tests or measurements:  Height, weight, and BMI.  Blood pressure.  Lipid and cholesterol levels. These may be checked every 5 years, or more frequently if you are over 15 years old.  Skin check.  Lung cancer screening. You may have this screening every year starting at age 28 if you have a 30-pack-year history of smoking and currently smoke or have quit within the past 15 years.  Fecal occult blood test (FOBT) of the stool. You may have this test every year starting at age 82.  Flexible sigmoidoscopy or colonoscopy. You may have a sigmoidoscopy every 5 years or a colonoscopy every 10 years starting at age 59.  Hepatitis C blood test.  Hepatitis B blood test.  Sexually transmitted disease (STD) testing.  Diabetes screening. This is done by checking your blood sugar (glucose) after you have not eaten for a while (fasting). You may have this done every 1-3 years.  Bone density scan. This is done to screen for osteoporosis. You may have this done starting at age 41.  Mammogram. This may  be done every 1-2 years. Talk to your health care provider about how often you should have regular mammograms. Talk with your health care provider about your test results, treatment options, and if necessary, the need for more tests. Vaccines  Your health care provider may  recommend certain vaccines, such as:  Influenza vaccine. This is recommended every year.  Tetanus, diphtheria, and acellular pertussis (Tdap, Td) vaccine. You may need a Td booster every 10 years.  Zoster vaccine. You may need this after age 44.  Pneumococcal 13-valent conjugate (PCV13) vaccine. One dose is recommended after age 12.  Pneumococcal polysaccharide (PPSV23) vaccine. One dose is recommended after age 50. Talk to your health care provider about which screenings and vaccines you need and how often you need them. This information is not intended to replace advice given to you by your health care provider. Make sure you discuss any questions you have with your health care provider. Document Released: 05/02/2015 Document Revised: 12/24/2015 Document Reviewed: 02/04/2015 Elsevier Interactive Patient Education  2017 [place] Prevention in the Home Falls can cause injuries. They can happen to people of all ages. There are many things you can do to make your home safe and to help prevent falls. What can I do on the outside of my home?  Regularly fix the edges of walkways and driveways and fix any cracks.  Remove anything that might make you trip as you walk through a door, such as a raised step or threshold.  Trim any bushes or trees on the path to your home.  Use bright outdoor lighting.  Clear any walking paths of anything that might make someone trip, such as rocks or tools.  Regularly check to see if handrails are loose or broken. Make sure that both sides of any steps have handrails.  Any raised decks and porches should have guardrails on the edges.  Have any leaves, snow, or ice cleared regularly.  Use sand or salt on walking paths during winter.  Clean up any spills in your garage right away. This includes oil or grease spills. What can I do in the bathroom?  Use night lights.  Install grab bars by the toilet and in the tub and shower. Do not use towel  bars as grab bars.  Use non-skid mats or decals in the tub or shower.  If you need to sit down in the shower, use a plastic, non-slip stool.  Keep the floor dry. Clean up any water that spills on the floor as soon as it happens.  Remove soap buildup in the tub or shower regularly.  Attach bath mats securely with double-sided non-slip rug tape.  Do not have throw rugs and other things on the floor that can make you trip. What can I do in the bedroom?  Use night lights.  Make sure that you have a light by your bed that is easy to reach.  Do not use any sheets or blankets that are too big for your bed. They should not hang down onto the floor.  Have a firm chair that has side arms. You can use this for support while you get dressed.  Do not have throw rugs and other things on the floor that can make you trip. What can I do in the kitchen?  Clean up any spills right away.  Avoid walking on wet floors.  Keep items that you use a lot in easy-to-reach places.  If you need to reach something above you,  use a strong step stool that has a grab bar.  Keep electrical cords out of the way.  Do not use floor polish or wax that makes floors slippery. If you must use wax, use non-skid floor wax.  Do not have throw rugs and other things on the floor that can make you trip. What can I do with my stairs?  Do not leave any items on the stairs.  Make sure that there are handrails on both sides of the stairs and use them. Fix handrails that are broken or loose. Make sure that handrails are as long as the stairways.  Check any carpeting to make sure that it is firmly attached to the stairs. Fix any carpet that is loose or worn.  Avoid having throw rugs at the top or bottom of the stairs. If you do have throw rugs, attach them to the floor with carpet tape.  Make sure that you have a light switch at the top of the stairs and the bottom of the stairs. If you do not have them, ask someone to  add them for you. What else can I do to help prevent falls?  Wear shoes that:  Do not have high heels.  Have rubber bottoms.  Are comfortable and fit you well.  Are closed at the toe. Do not wear sandals.  If you use a stepladder:  Make sure that it is fully opened. Do not climb a closed stepladder.  Make sure that both sides of the stepladder are locked into place.  Ask someone to hold it for you, if possible.  Clearly mark and make sure that you can see:  Any grab bars or handrails.  First and last steps.  Where the edge of each step is.  Use tools that help you move around (mobility aids) if they are needed. These include:  Canes.  Walkers.  Scooters.  Crutches.  Turn on the lights when you go into a dark area. Replace any light bulbs as soon as they burn out.  Set up your furniture so you have a clear path. Avoid moving your furniture around.  If any of your floors are uneven, fix them.  If there are any pets around you, be aware of where they are.  Review your medicines with your doctor. Some medicines can make you feel dizzy. This can increase your chance of falling. Ask your doctor what other things that you can do to help prevent falls. This information is not intended to replace advice given to you by your health care provider. Make sure you discuss any questions you have with your health care provider. Document Released: 01/30/2009 Document Revised: 09/11/2015 Document Reviewed: 05/10/2014 Elsevier Interactive Patient Education  2017 [employer].

## 2018-10-25 NOTE — Progress Notes (Signed)
Subjective:   Rachel Lopez is a 78 y.o. female who presents for Medicare Annual (Subsequent) preventive examination.  Review of Systems:   Cardiac Risk Factors include: advanced age (>26men, >60 women);hypertension;dyslipidemia     Objective:     Vitals: BP 112/80 (BP Location: Left Arm, Patient Position: Sitting, Cuff Size: Normal)   Pulse (!) 101   Temp (!) 97.2 F (36.2 C) (Temporal)   Resp 16   Ht 5\' 4"  (1.626 m)   Wt 136 lb 6.4 oz (61.9 kg)   SpO2 96%   BMI 23.41 kg/m   Body mass index is 23.41 kg/m.  Advanced Directives 10/25/2018 10/19/2017 10/18/2016 04/16/2015 04/03/2015 03/06/2015 03/04/2015  Does Patient Have a Medical Advance Directive? No Yes No Yes - Yes Yes  Type of Advance Directive - [place];Living will - Living will Living will [place];Living will Living will;Healthcare Power of Attorney  Does patient want to make changes to medical advance directive? - - - No - Patient declined - No - Patient declined -  Copy of [place] in Chart? - No - copy requested - No - copy requested No - copy requested No - copy requested -  Would patient like information on creating a medical advance directive? Yes (MAU/Ambulatory/Procedural Areas - Information given) - Yes (MAU/Ambulatory/Procedural Areas - Information given) - - - -    Tobacco Social History   Tobacco Use  Smoking Status Former Smoker  . Packs/day: 0.50  . Years: 15.00  . Pack years: 7.50  . Types: Cigarettes  . Quit date: 02/18/1979  . Years since quitting: 39.7  Smokeless Tobacco Never Used  Tobacco Comment   smoking cessation materials not required     Counseling given: Not Answered Comment: smoking cessation materials not required   Clinical Intake:  Pre-visit preparation completed: Yes  Pain : No/denies pain     BMI - recorded: 23.41 Nutritional Status: BMI of 19-24  Normal Nutritional Risks: None Diabetes: No  How often do  you need to have someone help you when you read instructions, pamphlets, or other written materials from your doctor or pharmacy?: 1 - Never  Interpreter Needed?: No  Information entered by :: [place]  Past Medical History:  Diagnosis Date  . Arthritis   . Benign hypertension 10/18/2014  . Chronic kidney disease   . HLD (hyperlipidemia) 10/18/2014  . Hyperlipidemia   . Hypertension   . Hypothyroidism, postablative 10/18/2014  . Papilloma of breast 10/28/2014  . Renal mass, right 01/01/2015  . Thyroid disease    Past Surgical History:  Procedure Laterality Date  . APPENDECTOMY     67yr  . BREAST BIOPSY Left 10/15/2014   papilloma -u/s bx /clip  . BREAST EXCISIONAL BIOPSY Left 04/2015   papilloma removed-neg  . HEMORRHOID SURGERY  2014  . LAPAROSCOPIC NEPHRECTOMY, HAND ASSISTED Right 04/16/2015   Procedure: HAND ASSISTED LAPAROSCOPIC NEPHRECTOMY;  Surgeon: Rachel Retort, MD;  Location: [facility] ORS;  Service: Urology;  Laterality: Right;  . RADICAL HYSTERECTOMY  1969  . TOTAL THYROIDECTOMY     age 64   Family History  Problem Relation Age of Onset  . Healthy Mother   . Healthy Father   . Prostate cancer Neg Hx   . Breast cancer Neg Hx   . Bladder Cancer Neg Hx   . Kidney cancer Neg Hx    Social History   Socioeconomic History  . Marital status: Widowed  Spouse name: Not on file  . Number of children: 1  . Years of education: Not on file  . Highest education level: 12th grade  Occupational History  . Occupation: Retired  Scientific laboratory technician  . Financial resource strain: Not hard at all  . Food insecurity    Worry: Never true    Inability: Never true  . Transportation needs    Medical: No    Non-medical: No  Tobacco Use  . Smoking status: Former Smoker    Packs/day: 0.50    Years: 15.00    Pack years: 7.50    Types: Cigarettes    Quit date: 02/18/1979    Years since quitting: 39.7  . Smokeless tobacco: Never Used  . Tobacco comment: smoking cessation  materials not required  Substance and Sexual Activity  . Alcohol use: No  . Drug use: No  . Sexual activity: Not Currently  Lifestyle  . Physical activity    Days per week: 0 days    Minutes per session: 0 min  . Stress: Not at all  Relationships  . Social connections    Talks on phone: More than three times a week    Gets together: Twice a week    Attends religious service: Never    Active member of club or organization: No    Attends meetings of clubs or organizations: Never    Relationship status: Widowed  Other Topics Concern  . Not on file  Social History Narrative   Pt is originally from [place]. Moved to [place] in [place].    Outpatient Encounter Medications as of 10/25/2018  Medication Sig  . atorvastatin (LIPITOR) 20 MG tablet TAKE 1 TABLET BY MOUTH AT BEDTIME  . levothyroxine (SYNTHROID, LEVOTHROID) 88 MCG tablet TAKE 1 TABLET BY MOUTH EVERY DAY  . lisinopril (PRINIVIL,ZESTRIL) 40 MG tablet TAKE 1 TABLET BY MOUTH DAILY   No facility-administered encounter medications on file as of 10/25/2018.     Activities of Daily Living In your present state of health, do you have any difficulty performing the following activities: 10/25/2018  Hearing? N  Comment declines hearing aids  Vision? N  Difficulty concentrating or making decisions? N  Walking or climbing stairs? N  Dressing or bathing? N  Doing errands, shopping? N  Preparing Food and eating ? N  Using the Toilet? N  In the past six months, have you accidently leaked urine? N  Do you have problems with loss of bowel control? N  Managing your Medications? N  Managing your Finances? N  Housekeeping or managing your Housekeeping? N  Some recent data might be hidden    Patient Care Team: Rachel Hess, MD as PCP - General (Family Medicine)    Assessment:   This is a routine wellness examination for Rachel Lopez.  Exercise Activities and Dietary recommendations Current Exercise Habits: The patient does not  participate in regular exercise at present, Exercise limited by: None identified  Goals    . Cut out extra servings    . DIET - INCREASE WATER INTAKE     Recommend to drink at least 6-8 8oz glasses of water per day.       Fall Risk Fall Risk  10/25/2018 05/10/2018 10/19/2017 10/18/2016 12/30/2014  Falls in the past year? 0 0 No No No  Number falls in past yr: 0 0 - - -  Injury with Fall? 0 0 - - -  Risk for fall due to : - - Impaired balance/gait - -  Risk for fall due to: Comment - - joint pain - -  Follow up Falls prevention discussed Falls evaluation completed - - -   FALL RISK PREVENTION PERTAINING TO THE HOME:  Any stairs in or around the home? No  If so, do they handrails? No   Home free of loose throw rugs in walkways, pet beds, electrical cords, etc? Yes  Adequate lighting in your home to reduce risk of falls? Yes   ASSISTIVE DEVICES UTILIZED TO PREVENT FALLS:  Life alert? No  Use of a cane, walker or w/c? No  Grab bars in the bathroom? No  Shower chair or bench in shower? No  Elevated toilet seat or a handicapped toilet? No   DME ORDERS:  DME order needed?  No   TIMED UP AND GO:  Was the test performed? Yes .  Length of time to ambulate 10 feet: 5 sec.   GAIT:  Appearance of gait: Gait stead-fast and without the use of an assistive device.   Education: Fall risk prevention has been discussed.  Intervention(s) required? No   Depression Screen PHQ 2/9 Scores 10/25/2018 05/10/2018 10/19/2017 10/18/2016  PHQ - 2 Score 0 0 0 0  PHQ- 9 Score - - 0 -     Cognitive Function     6CIT Screen 10/25/2018 10/19/2017 10/18/2016 04/21/2016  What Year? 0 points 0 points 0 points 0 points  What month? 0 points 0 points 0 points 0 points  What time? 0 points 0 points 0 points 0 points  Count back from 20 0 points 0 points 0 points 0 points  Months in reverse 0 points 0 points 0 points 0 points  Repeat phrase 4 points 4 points 0 points 0 points  Total Score 4 4 0 0     Immunization History  Administered Date(s) Administered  . Influenza, High Dose Seasonal PF 01/21/2017  . Influenza-Unspecified 01/27/2015, 02/15/2018  . Pneumococcal Conjugate-13 09/10/2014  . Pneumococcal Polysaccharide-23 10/18/2016    Qualifies for Shingles Vaccine? Yes . Due for Shingrix. Education has been provided regarding the importance of this vaccine. Pt has been advised to call insurance company to determine out of pocket expense. Advised may also receive vaccine at local pharmacy or Health Dept. Verbalized acceptance and understanding.  Tdap: Although this vaccine is not a covered service during a Wellness Exam, does the patient still wish to receive this vaccine today?  No .  Education has been provided regarding the importance of this vaccine. Advised may receive this vaccine at local pharmacy or Health Dept. Aware to provide a copy of the vaccination record if obtained from local pharmacy or Health Dept. Verbalized acceptance and understanding.  Flu Vaccine: Up to date  Pneumococcal Vaccine: Up to date   Screening Tests Health Maintenance  Topic Date Due  . TETANUS/TDAP  01/22/1961  . INFLUENZA VACCINE  11/18/2018  . MAMMOGRAM  12/01/2018  . DEXA SCAN  Completed  . PNA vac Low Risk Adult  Completed    Cancer Screenings:  Colorectal Screening: Completed 05/04/11 (patient reported). Repeat every 10 years; she would like to discuss possible Cologuard with Dr. Army Melia as alternative.   Mammogram: Completed 11/30/17. Repeat every year. Ordered today. Pt provided with contact information and advised to call to schedule appt.   Bone Density: Completed 11/30/17. Results reflect OSTEOPOROSIS. Repeat every 2 years.    Lung Cancer Screening: (Low Dose CT Chest recommended if Age 65-80 years, 30 pack-year currently smoking OR have quit w/in 15years.) does  not qualify.    Additional Screening:  Hepatitis C Screening: no longer required.   Vision Screening: Recommended  annual ophthalmology exams for early detection of glaucoma and other disorders of the eye. Is the patient up to date with their annual eye exam?  No  Who is the provider or what is the name of the office in which the pt attends annual eye exams? Not established If pt is not established with a provider, would they like to be referred to a provider to establish care? No . Pt to contact insurance for preferred provider.  Dental Screening: Recommended annual dental exams for proper oral hygiene  Community Resource Referral:  CRR required this visit?  No       Plan:     I have personally reviewed and addressed the Medicare Annual Wellness questionnaire and have noted the following in the patient's chart:  A. Medical and social history B. Use of alcohol, tobacco or illicit drugs  C. Current medications and supplements D. Functional ability and status E.  Nutritional status F.  Physical activity G. Advance directives H. List of other physicians I.  Hospitalizations, surgeries, and ER visits in previous 12 months J.  [place] such as hearing and vision if needed, cognitive and depression L. Referrals and appointments   In addition, I have reviewed and discussed with patient certain preventive protocols, quality metrics, and best practice recommendations. A written personalized care plan for preventive services as well as general preventive health recommendations were provided to patient.   Signed,  Rachel Marker, [place] Nurse Health Advisor   Nurse Notes: Pt doing well and appreciative of visit today. Advised due for yearly lab work and needs to schedule f/u appt with Dr. Army Melia.

## 2018-12-20 ENCOUNTER — Other Ambulatory Visit: Payer: Self-pay | Admitting: Internal Medicine

## 2018-12-20 DIAGNOSIS — E782 Mixed hyperlipidemia: Secondary | ICD-10-CM

## 2019-01-15 ENCOUNTER — Other Ambulatory Visit: Payer: Self-pay | Admitting: Internal Medicine

## 2019-01-23 ENCOUNTER — Ambulatory Visit: Payer: Medicare Other

## 2019-02-06 ENCOUNTER — Other Ambulatory Visit: Payer: Self-pay | Admitting: Internal Medicine

## 2019-02-06 DIAGNOSIS — I10 Essential (primary) hypertension: Secondary | ICD-10-CM

## 2019-02-20 ENCOUNTER — Encounter (INDEPENDENT_AMBULATORY_CARE_PROVIDER_SITE_OTHER): Payer: Self-pay

## 2019-02-20 ENCOUNTER — Other Ambulatory Visit: Payer: Self-pay

## 2019-02-20 ENCOUNTER — Ambulatory Visit
Admission: RE | Admit: 2019-02-20 | Discharge: 2019-02-20 | Disposition: A | Discharge status: Home / Self Care | Payer: Medicare Other | Source: Ambulatory Visit | Attending: Internal Medicine | Admitting: Internal Medicine

## 2019-02-20 DIAGNOSIS — Z1231 Encounter for screening mammogram for malignant neoplasm of breast: Secondary | ICD-10-CM | POA: Insufficient documentation

## 2019-04-30 ENCOUNTER — Encounter: Payer: Medicare Other | Admitting: Internal Medicine

## 2019-06-01 ENCOUNTER — Encounter: Payer: Medicare Other | Admitting: Internal Medicine

## 2019-06-14 ENCOUNTER — Ambulatory Visit: Payer: Medicare Other | Attending: Internal Medicine

## 2019-06-14 DIAGNOSIS — Z23 Encounter for immunization: Secondary | ICD-10-CM

## 2019-06-14 NOTE — Progress Notes (Signed)
   Covid-19 Vaccination Clinic  Name:  [place]    MRN: [id number] DOB: 08-19-1940  06/14/2019  [name] was observed post Covid-19 immunization for 15 minutes without incidence. She was provided with Vaccine Information Sheet and instruction to access the V-Safe system.   [name] was instructed to call 911 with any severe reactions post vaccine: Marland Kitchen Difficulty breathing  . Swelling of your face and throat  . A fast heartbeat  . A bad rash all over your body  . Dizziness and weakness    Immunizations Administered    Name Date Dose VIS Date Route   Pfizer COVID-19 Vaccine 06/14/2019  8:54 AM 0.3 mL 03/30/2019 Intramuscular   Manufacturer: [place]   Lot: [id number]   [place]: [id number]

## 2019-06-19 ENCOUNTER — Ambulatory Visit (INDEPENDENT_AMBULATORY_CARE_PROVIDER_SITE_OTHER): Payer: Medicare Other | Admitting: Internal Medicine

## 2019-06-19 ENCOUNTER — Encounter: Payer: Self-pay | Admitting: Internal Medicine

## 2019-06-19 ENCOUNTER — Other Ambulatory Visit: Payer: Self-pay

## 2019-06-19 VITALS — BP 122/82 | HR 90 | Temp 97.1°F | Ht 64.0 in | Wt 133.0 lb

## 2019-06-19 DIAGNOSIS — Z1231 Encounter for screening mammogram for malignant neoplasm of breast: Secondary | ICD-10-CM | POA: Diagnosis not present

## 2019-06-19 DIAGNOSIS — I10 Essential (primary) hypertension: Secondary | ICD-10-CM | POA: Diagnosis not present

## 2019-06-19 DIAGNOSIS — M1711 Unilateral primary osteoarthritis, right knee: Secondary | ICD-10-CM

## 2019-06-19 DIAGNOSIS — E890 Postprocedural hypothyroidism: Secondary | ICD-10-CM

## 2019-06-19 DIAGNOSIS — E782 Mixed hyperlipidemia: Secondary | ICD-10-CM | POA: Diagnosis not present

## 2019-06-19 DIAGNOSIS — N183 Chronic kidney disease, stage 3 unspecified: Secondary | ICD-10-CM | POA: Diagnosis not present

## 2019-06-19 DIAGNOSIS — Z0000 Encounter for general adult medical examination without abnormal findings: Secondary | ICD-10-CM

## 2019-06-19 LAB — POCT URINALYSIS DIPSTICK
Bilirubin, UA: NEGATIVE
Blood, UA: NEGATIVE
Glucose, UA: NEGATIVE
Ketones, UA: NEGATIVE
Leukocytes, UA: NEGATIVE
Nitrite, UA: NEGATIVE
Protein, UA: POSITIVE — AB
Spec Grav, UA: 1.01 (ref 1.010–1.025)
Urobilinogen, UA: 0.2 E.U./dL
pH, UA: 6 (ref 5.0–8.0)

## 2019-06-19 NOTE — Patient Instructions (Signed)
For cognitive exercise:  Cross word puzzles, Word search, [employer] on [employer] or [employer]

## 2019-06-19 NOTE — Progress Notes (Signed)
Date:  06/19/2019   Name:  Rachel Lopez   DOB:  [birth year]   MRN:  [id number]   Chief Complaint: Annual Exam (Breast exam. no pap- aged out. Medicare Yearly.) and Knee Pain (Right knee pain after walking for a long period of time. Been aching on and off for a year. ) Rachel Lopez is a 79 y.o. female who presents today for her Complete Annual Exam. She feels well. She reports exercising walking the dog 25 min per day. She reports she is sleeping well.   Mammogram  02/2019 DEXA  11/2017 Colonoscopy  04/2011 Immunization History  Administered Date(s) Administered  . Influenza, High Dose Seasonal PF 01/21/2017, 01/15/2019  . Influenza-Unspecified 01/27/2015, 02/15/2018  . PFIZER SARS-COV-2 Vaccination 06/14/2019  . Pneumococcal Conjugate-13 09/10/2014  . Pneumococcal Polysaccharide-23 10/18/2016    Hypertension This is a chronic problem. The problem is controlled. Pertinent negatives include no chest pain, headaches, palpitations or shortness of breath. There are no associated agents to hypertension. Past treatments include ACE inhibitors. There are no compliance problems.  Identifiable causes of hypertension include a thyroid problem.  Hyperlipidemia This is a chronic problem. Recent lipid tests were reviewed and are normal. Pertinent negatives include no chest pain or shortness of breath. (She feels that she has some memory impairment from Lipitor) Current antihyperlipidemic treatment includes statins. The current treatment provides significant improvement of lipids.  Thyroid Problem Presents for follow-up visit. Patient reports no anxiety, constipation, diarrhea, fatigue, palpitations or tremors. The symptoms have been stable. Her past medical history is significant for hyperlipidemia.  Knee Pain  There was no injury mechanism. The pain is present in the right knee. The quality of the pain is described as aching. The pain is at a severity of 2/10. The pain is mild. The pain has been  fluctuating since onset. Pertinent negatives include no inability to bear weight, loss of motion, loss of sensation, muscle weakness or numbness. The symptoms are aggravated by weight bearing. Treatments tried: compression sleeve.    Lab Results  Component Value Date   CREATININE 1.38 (H) 05/10/2018   BUN 20 05/10/2018   NA 138 05/10/2018   K 4.6 05/10/2018   CL 100 05/10/2018   CO2 20 05/10/2018   Lab Results  Component Value Date   CHOL 185 11/03/2017   HDL 46 11/03/2017   LDLCALC 105 (H) 11/03/2017   TRIG 171 (H) 11/03/2017   CHOLHDL 4.0 11/03/2017   Lab Results  Component Value Date   TSH 0.353 (L) 11/03/2017   No results found for: HGBA1C   Review of Systems  Constitutional: Negative for chills, fatigue and fever.  HENT: Negative for congestion, hearing loss, tinnitus, trouble swallowing and voice change.   Eyes: Negative for visual disturbance.  Respiratory: Negative for cough, chest tightness, shortness of breath and wheezing.   Cardiovascular: Negative for chest pain, palpitations and leg swelling.  Gastrointestinal: Negative for abdominal pain, constipation, diarrhea and vomiting.  Endocrine: Negative for polydipsia and polyuria.  Genitourinary: Negative for dysuria, frequency, genital sores, vaginal bleeding and vaginal discharge.  Musculoskeletal: Negative for arthralgias (knee pain and stiffness), gait problem and joint swelling.  Skin: Negative for color change and rash.  Neurological: Negative for dizziness, tremors, light-headedness, numbness and headaches.  Hematological: Negative for adenopathy. Does not bruise/bleed easily.  Psychiatric/Behavioral: Negative for dysphoric mood and sleep disturbance. The patient is not nervous/anxious.     Patient Active Problem List   Diagnosis Date Noted  . Arthritis of knee,  right 11/03/2017  . Chronic renal insufficiency, stage 3 (moderate) 04/21/2016  . Angiomyolipoma of kidney 01/01/2015  . HLD (hyperlipidemia)  10/18/2014  . Benign hypertension 10/18/2014  . Hypothyroidism, postablative 10/18/2014  . Intraductal papilloma of left breast 10/18/2014    No Known Allergies  Past Surgical History:  Procedure Laterality Date  . APPENDECTOMY     64yr  . BREAST BIOPSY Left 10/15/2014   papilloma -u/s bx /clip  . BREAST EXCISIONAL BIOPSY Left 04/2015   papilloma removed-neg  . HEMORRHOID SURGERY  2014  . LAPAROSCOPIC NEPHRECTOMY, HAND ASSISTED Right 04/16/2015   Procedure: HAND ASSISTED LAPAROSCOPIC NEPHRECTOMY;  Surgeon: Nickie Retort, MD;  Location: [facility] ORS;  Service: Urology;  Laterality: Right;  . RADICAL HYSTERECTOMY  1969  . TOTAL THYROIDECTOMY     age 21    Social History   Tobacco Use  . Smoking status: Former Smoker    Packs/day: 0.50    Years: 15.00    Pack years: 7.50    Types: Cigarettes    Quit date: 02/18/1979    Years since quitting: 40.3  . Smokeless tobacco: Never Used  . Tobacco comment: smoking cessation materials not required  Substance Use Topics  . Alcohol use: No  . Drug use: No     Medication list has been reviewed and updated.  Current Meds  Medication Sig  . atorvastatin (LIPITOR) 20 MG tablet TAKE 1 TABLET BY MOUTH EVERY NIGHT AT BEDTIME  . levothyroxine (SYNTHROID) 88 MCG tablet TAKE 1 TABLET BY MOUTH EVERY DAY  . lisinopril (ZESTRIL) 40 MG tablet TAKE 1 TABLET BY MOUTH DAILY    PHQ 2/9 Scores 06/19/2019 10/25/2018 05/10/2018 10/19/2017  PHQ - 2 Score 0 0 0 0  PHQ- 9 Score - - - 0   6CIT Screen 10/25/2018 10/19/2017 10/18/2016 04/21/2016  What Year? 0 points 0 points 0 points 0 points  What month? 0 points 0 points 0 points 0 points  What time? 0 points 0 points 0 points 0 points  Count back from 20 0 points 0 points 0 points 0 points  Months in reverse 0 points 0 points 0 points 0 points  Repeat phrase 4 points 4 points 0 points 0 points  Total Score 4 4 0 0      BP Readings from Last 3 Encounters:  06/19/19 122/82  10/25/18 112/80  05/10/18  132/80    Physical Exam Vitals and nursing note reviewed.  Constitutional:      General: She is not in acute distress.    Appearance: She is well-developed.  HENT:     Head: Normocephalic and atraumatic.     Right Ear: Tympanic membrane and ear canal normal.     Left Ear: Tympanic membrane and ear canal normal.     Nose:     Right Sinus: No maxillary sinus tenderness.     Left Sinus: No maxillary sinus tenderness.  Eyes:     General: No scleral icterus.       Right eye: No discharge.        Left eye: No discharge.     Conjunctiva/sclera: Conjunctivae normal.  Neck:     Thyroid: No thyromegaly.     Vascular: No carotid bruit.  Cardiovascular:     Rate and Rhythm: Normal rate and regular rhythm.     Pulses: Normal pulses.     Heart sounds: Normal heart sounds.  Pulmonary:     Effort: Pulmonary effort is normal. No respiratory distress.  Breath sounds: No wheezing.  Chest:     Breasts:        Right: No mass, nipple discharge, skin change or tenderness.        Left: No mass, nipple discharge, skin change or tenderness.  Abdominal:     General: Bowel sounds are normal.     Palpations: Abdomen is soft.     Tenderness: There is no abdominal tenderness.  Musculoskeletal:        General: Normal range of motion.     Cervical back: Normal range of motion. No erythema.     Right lower leg: No edema.     Left lower leg: No edema.  Lymphadenopathy:     Cervical: No cervical adenopathy.  Skin:    General: Skin is warm and dry.     Capillary Refill: Capillary refill takes less than 2 seconds.     Findings: No rash.  Neurological:     General: No focal deficit present.     Mental Status: She is alert and oriented to person, place, and time.     Cranial Nerves: No cranial nerve deficit.     Sensory: No sensory deficit.     Deep Tendon Reflexes: Reflexes are normal and symmetric.  Psychiatric:        Attention and Perception: Attention normal.        Mood and Affect: Mood  normal.        Speech: Speech normal.        Behavior: Behavior normal.        Thought Content: Thought content normal.        Cognition and Memory: Cognition normal.        Judgment: Judgment normal.     Wt Readings from Last 3 Encounters:  06/19/19 133 lb (60.3 kg)  10/25/18 136 lb 6.4 oz (61.9 kg)  05/10/18 139 lb (63 kg)    BP 122/82   Pulse 90   Temp (!) 97.1 F (36.2 C) (Temporal)   Ht 5\' 4"  (1.626 m)   Wt 133 lb (60.3 kg)   SpO2 97%   BMI 22.83 kg/m   Assessment and Plan: 1. Annual physical exam Normal exam Continue regular exercise, healthy diet Mental exercises like crossword puzzles; also apps on IPad - POCT urinalysis dipstick  2. Encounter for screening mammogram for breast cancer Schedule for November - MM 3D SCREEN BREAST BILATERAL; Future  3. Benign hypertension Clinically stable exam with well controlled BP on lisinopril. Tolerating medications without side effects at this time. Pt to continue current regimen and low sodium diet; benefits of regular exercise as able discussed. - CBC with Differential/Platelet - Comprehensive metabolic panel  4. Hypothyroidism, postablative supplemented - TSH + free T4  5. Mixed hyperlipidemia Pt feels that lipitor has reduced her mental acuity She can cut is back to 1/2 per day or stop altogether Recheck 6CIT next visit at [place] (was 4 6 months ago) - Lipid panel  6. Chronic renal insufficiency, stage 3 (moderate) Continue to monitor Avoid oral Nsaids - Comprehensive metabolic panel  7. Arthritis of knee, right Sample of topical Voltaren given   Partially dictated using Editor, commissioning. Any errors are unintentional.  Halina Maidens, MD [facility]  06/19/2019

## 2019-06-20 LAB — LIPID PANEL
Chol/HDL Ratio: 3.4 ratio (ref 0.0–4.4)
Cholesterol, Total: 194 mg/dL (ref 100–199)
HDL: 57 mg/dL (ref >39)
LDL Chol Calc (NIH): 115 mg/dL — ABNORMAL HIGH (ref 0–99)
Triglycerides: 125 mg/dL (ref 0–149)
VLDL Cholesterol Cal: 22 mg/dL (ref 5–40)

## 2019-06-20 LAB — COMPREHENSIVE METABOLIC PANEL
ALT: 8 IU/L (ref 0–32)
AST: 18 IU/L (ref 0–40)
Albumin/Globulin Ratio: 2.1 (ref 1.2–2.2)
Albumin: 4.6 g/dL (ref 3.7–4.7)
Alkaline Phosphatase: 128 IU/L — ABNORMAL HIGH (ref 39–117)
BUN/Creatinine Ratio: 8 — ABNORMAL LOW (ref 12–28)
BUN: 11 mg/dL (ref 8–27)
Bilirubin Total: 0.5 mg/dL (ref 0.0–1.2)
CO2: 24 mmol/L (ref 20–29)
Calcium: 9.4 mg/dL (ref 8.7–10.3)
Chloride: 100 mmol/L (ref 96–106)
Creatinine, Ser: 1.34 mg/dL — ABNORMAL HIGH (ref 0.57–1.00)
GFR calc Af Amer: 44 mL/min/{1.73_m2} — ABNORMAL LOW (ref >59)
GFR calc non Af Amer: 38 mL/min/{1.73_m2} — ABNORMAL LOW (ref >59)
Globulin, Total: 2.2 g/dL (ref 1.5–4.5)
Glucose: 85 mg/dL (ref 65–99)
Potassium: 4.9 mmol/L (ref 3.5–5.2)
Sodium: 137 mmol/L (ref 134–144)
Total Protein: 6.8 g/dL (ref 6.0–8.5)

## 2019-06-20 LAB — CBC WITH DIFFERENTIAL/PLATELET
Basophils Absolute: 0.1 10*3/uL (ref 0.0–0.2)
Basos: 1 %
EOS (ABSOLUTE): 0.2 10*3/uL (ref 0.0–0.4)
Eos: 3 %
Hematocrit: 45.1 % (ref 34.0–46.6)
Hemoglobin: 15.4 g/dL (ref 11.1–15.9)
Immature Grans (Abs): 0 10*3/uL (ref 0.0–0.1)
Immature Granulocytes: 0 %
Lymphocytes Absolute: 0.9 10*3/uL (ref 0.7–3.1)
Lymphs: 12 %
MCH: 30.4 pg (ref 26.6–33.0)
MCHC: 34.1 g/dL (ref 31.5–35.7)
MCV: 89 fL (ref 79–97)
Monocytes Absolute: 0.6 10*3/uL (ref 0.1–0.9)
Monocytes: 9 %
Neutrophils Absolute: 5.2 10*3/uL (ref 1.4–7.0)
Neutrophils: 75 %
Platelets: 279 10*3/uL (ref 150–450)
RBC: 5.07 x10E6/uL (ref 3.77–5.28)
RDW: 12.3 % (ref 11.7–15.4)
WBC: 7 10*3/uL (ref 3.4–10.8)

## 2019-06-20 LAB — TSH+FREE T4
Free T4: 2.19 ng/dL — ABNORMAL HIGH (ref 0.82–1.77)
TSH: 0.364 u[IU]/mL — ABNORMAL LOW (ref 0.450–4.500)

## 2019-06-28 ENCOUNTER — Other Ambulatory Visit: Payer: Self-pay | Admitting: Internal Medicine

## 2019-06-28 DIAGNOSIS — E890 Postprocedural hypothyroidism: Secondary | ICD-10-CM

## 2019-06-28 MED ORDER — LEVOTHYROXINE SODIUM 75 MCG PO TABS: 75.0000 ug | ORAL_TABLET | Freq: Every day | ORAL | 1 refills | Status: DC

## 2019-07-10 ENCOUNTER — Ambulatory Visit: Payer: Medicare Other | Attending: Internal Medicine

## 2019-07-10 DIAGNOSIS — Z23 Encounter for immunization: Secondary | ICD-10-CM

## 2019-07-10 NOTE — Progress Notes (Signed)
   Covid-19 Vaccination Clinic  Name:  Rachel Lopez    MRN: [id number] DOB: March 03, 1941  07/10/2019  [name] was observed post Covid-19 immunization for 15 minutes without incident. She was provided with Vaccine Information Sheet and instruction to access the V-Safe system.   [name] was instructed to call 911 with any severe reactions post vaccine: Marland Kitchen Difficulty breathing  . Swelling of face and throat  . A fast heartbeat  . A bad rash all over body  . Dizziness and weakness   Immunizations Administered    Name Date Dose VIS Date Route   Pfizer COVID-19 Vaccine 07/10/2019  8:47 AM 0.3 mL 03/30/2019 Intramuscular   Manufacturer: [employer]   Lot: [id number]   [place]: [id number]

## 2019-10-29 ENCOUNTER — Ambulatory Visit: Payer: Medicare Other

## 2019-11-12 ENCOUNTER — Ambulatory Visit (INDEPENDENT_AMBULATORY_CARE_PROVIDER_SITE_OTHER): Payer: Medicare Other

## 2019-11-12 ENCOUNTER — Other Ambulatory Visit: Payer: Self-pay

## 2019-11-12 VITALS — BP 112/72 | HR 80 | Temp 97.5°F | Resp 16 | Ht 64.0 in | Wt 131.0 lb

## 2019-11-12 DIAGNOSIS — Z0000 Encounter for general adult medical examination without abnormal findings: Secondary | ICD-10-CM

## 2019-11-12 NOTE — Progress Notes (Signed)
Subjective:   Rachel Lopez is a 79 y.o. female who presents for Medicare Annual (Subsequent) preventive examination.  Review of Systems     Cardiac Risk Factors include: advanced age (>52men, >24 women);dyslipidemia;hypertension     Objective:    Today's Vitals   11/12/19 1127  BP: 112/72  Pulse: 80  Resp: 16  Temp: (!) 97.5 F (36.4 C)  TempSrc: Oral  SpO2: 97%  Weight: 131 lb (59.4 kg)  Height: 5\' 4"  (1.626 m)   Body mass index is 22.49 kg/m.  Advanced Directives 11/12/2019 10/25/2018 10/19/2017 10/18/2016 04/16/2015 04/03/2015 03/06/2015  Does Patient Have a Medical Advance Directive? No No Yes No Yes - Yes  Type of Advance Directive - - [place];Living will - Living will Living will [place];Living will  Does patient want to make changes to medical advance directive? - - - - No - Patient declined - No - Patient declined  Copy of [place] in Chart? - - No - copy requested - No - copy requested No - copy requested No - copy requested  Would patient like information on creating a medical advance directive? Yes (MAU/Ambulatory/Procedural Areas - Information given) Yes (MAU/Ambulatory/Procedural Areas - Information given) - Yes (MAU/Ambulatory/Procedural Areas - Information given) - - -    Current Medications (verified) Outpatient Encounter Medications as of 11/12/2019  Medication Sig  . atorvastatin (LIPITOR) 20 MG tablet TAKE 1 TABLET BY MOUTH EVERY NIGHT AT BEDTIME  . levothyroxine (SYNTHROID) 88 MCG tablet Take 88 mcg by mouth daily.  Marland Kitchen lisinopril (ZESTRIL) 40 MG tablet TAKE 1 TABLET BY MOUTH DAILY  . [DISCONTINUED] levothyroxine (SYNTHROID) 75 MCG tablet Take 1 tablet (75 mcg total) by mouth daily.   No facility-administered encounter medications on file as of 11/12/2019.    Allergies (verified) Patient has no known allergies.   History: Past Medical History:  Diagnosis Date  . Arthritis   . Benign  hypertension 10/18/2014  . Chronic kidney disease   . HLD (hyperlipidemia) 10/18/2014  . Hyperlipidemia   . Hypertension   . Hypothyroidism, postablative 10/18/2014  . Papilloma of breast 10/28/2014  . Renal mass, right 01/01/2015  . Thyroid disease    Past Surgical History:  Procedure Laterality Date  . APPENDECTOMY     66yr  . BREAST BIOPSY Left 10/15/2014   papilloma -u/s bx /clip  . BREAST EXCISIONAL BIOPSY Left 04/2015   papilloma removed-neg  . HEMORRHOID SURGERY  2014  . LAPAROSCOPIC NEPHRECTOMY, HAND ASSISTED Right 04/16/2015   Procedure: HAND ASSISTED LAPAROSCOPIC NEPHRECTOMY;  Surgeon: Nickie Retort, MD;  Location: [facility] ORS;  Service: Urology;  Laterality: Right;  . RADICAL HYSTERECTOMY  1969  . TOTAL THYROIDECTOMY     age 44   Family History  Problem Relation Age of Onset  . Healthy Mother   . Healthy Father   . Prostate cancer Neg Hx   . Breast cancer Neg Hx   . Bladder Cancer Neg Hx   . Kidney cancer Neg Hx    Social History   Socioeconomic History  . Marital status: Widowed    Spouse name: Not on file  . Number of children: 1  . Years of education: Not on file  . Highest education level: 12th grade  Occupational History  . Occupation: Retired  Tobacco Use  . Smoking status: Former Smoker    Packs/day: 0.50    Years: 15.00    Pack years: 7.50    Types: Cigarettes  Quit date: 02/18/1979    Years since quitting: 40.7  . Smokeless tobacco: Never Used  . Tobacco comment: smoking cessation materials not required  Vaping Use  . Vaping Use: Never used  Substance and Sexual Activity  . Alcohol use: No  . Drug use: No  . Sexual activity: Not Currently  Other Topics Concern  . Not on file  Social History Narrative   Pt is originally from [place]. Moved to [place] in 1969. Lives alone but son lives next door.    Social Determinants of Health   Financial Resource Strain: Low Risk   . Difficulty of Paying Living Expenses: Not hard at all    Food Insecurity: No Food Insecurity  . Worried About Charity fundraiser in the Last Year: Never true  . Ran Out of Food in the Last Year: Never true  Transportation Needs: No Transportation Needs  . Lack of Transportation (Medical): No  . Lack of Transportation (Non-Medical): No  Physical Activity: Insufficiently Active  . Days of Exercise per Week: 7 days  . Minutes of Exercise per Session: 20 min  Stress: No Stress Concern Present  . Feeling of Stress : Not at all  Social Connections: Socially Isolated  . Frequency of Communication with Friends and Family: More than three times a week  . Frequency of Social Gatherings with Friends and Family: Twice a week  . Attends Religious Services: Never  . Active Member of Clubs or Organizations: No  . Attends Archivist Meetings: Never  . Marital Status: Widowed    Tobacco Counseling Counseling given: Not Answered Comment: smoking cessation materials not required   Clinical Intake:  Pre-visit preparation completed: Yes  Pain : No/denies pain     BMI - recorded: 22.49 Nutritional Status: BMI of 19-24  Normal Nutritional Risks: None Diabetes: No  How often do you need to have someone help you when you read instructions, pamphlets, or other written materials from your doctor or pharmacy?: 1 - Never    Interpreter Needed?: No  Information entered by :: Clemetine Marker LPN   Activities of Daily Living In your present state of health, do you have any difficulty performing the following activities: 11/12/2019  Hearing? N  Comment declines hearing aids  Vision? N  Difficulty concentrating or making decisions? N  Walking or climbing stairs? N  Dressing or bathing? N  Doing errands, shopping? N  Preparing Food and eating ? N  Using the Toilet? N  In the past six months, have you accidently leaked urine? N  Do you have problems with loss of bowel control? N  Managing your Medications? N  Managing your Finances? N   Housekeeping or managing your Housekeeping? N  Some recent data might be hidden    Patient Care Team: Glean Hess, MD as PCP - General (Family Medicine)  Indicate any recent Medical Services you may have received from other than [facility] providers in the past year (date may be approximate).     Assessment:   This is a routine wellness examination for Al.  Hearing/Vision screen  Hearing Screening   125Hz  250Hz  500Hz  1000Hz  2000Hz  3000Hz  4000Hz  6000Hz  8000Hz   Right ear:           Left ear:           Comments: Pt denies hearing difficulty  Vision Screening Comments: Past due for eye exam. Pt to call for appt  Dietary issues and exercise activities discussed: Current Exercise Habits:  Home exercise routine, Type of exercise: walking, Time (Minutes): 20, Frequency (Times/Week): 7, Weekly Exercise (Minutes/Week): 140, Intensity: Mild, Exercise limited by: None identified  Goals    . Cut out extra servings    . DIET - INCREASE WATER INTAKE     Recommend to drink at least 6-8 8oz glasses of water per day.      Depression Screen PHQ 2/9 Scores 11/12/2019 06/19/2019 10/25/2018 05/10/2018 10/19/2017 10/18/2016 12/30/2014  PHQ - 2 Score 0 0 0 0 0 0 0  PHQ- 9 Score - - - - 0 - -    Fall Risk Fall Risk  11/12/2019 06/19/2019 10/25/2018 05/10/2018 10/19/2017  Falls in the past year? 0 0 0 0 No  Number falls in past yr: 0 0 0 0 -  Injury with Fall? 0 0 0 0 -  Risk for fall due to : No Fall Risks - - - Impaired balance/gait  Risk for fall due to: Comment - - - - joint pain  Follow up Falls prevention discussed Falls evaluation completed Falls prevention discussed Falls evaluation completed -    Any stairs in or around the home? No  If so, are there any without handrails? No  Home free of loose throw rugs in walkways, pet beds, electrical cords, etc? Yes  Adequate lighting in your home to reduce risk of falls? Yes   ASSISTIVE DEVICES UTILIZED TO PREVENT FALLS:  Life alert? No  Use of a cane,  walker or w/c? No  Grab bars in the bathroom? No  Shower chair or bench in shower? No  Elevated toilet seat or a handicapped toilet? No   TIMED UP AND GO:  Was the test performed? Yes .  Length of time to ambulate 10 feet: 5 sec.   Gait steady and fast without use of assistive device  Cognitive Function:     6CIT Screen 11/12/2019 10/25/2018 10/19/2017 10/18/2016 04/21/2016  What Year? 0 points 0 points 0 points 0 points 0 points  What month? 0 points 0 points 0 points 0 points 0 points  What time? 0 points 0 points 0 points 0 points 0 points  Count back from 20 0 points 0 points 0 points 0 points 0 points  Months in reverse 0 points 0 points 0 points 0 points 0 points  Repeat phrase 10 points 4 points 4 points 0 points 0 points  Total Score 10 4 4  0 0    Immunizations Immunization History  Administered Date(s) Administered  . Influenza, High Dose Seasonal PF 01/21/2017, 01/15/2019  . Influenza-Unspecified 01/27/2015, 02/15/2018  . PFIZER SARS-COV-2 Vaccination 06/14/2019, 07/10/2019  . Pneumococcal Conjugate-13 09/10/2014  . Pneumococcal Polysaccharide-23 10/18/2016    TDAP status: Due, Education has been provided regarding the importance of this vaccine. Advised may receive this vaccine at local pharmacy or Health Dept. Aware to provide a copy of the vaccination record if obtained from local pharmacy or Health Dept. Verbalized acceptance and understanding.   Flu Vaccine status: Up to date   Pneumococcal vaccine status: Up to date   Covid-19 vaccine status: Completed vaccines  Qualifies for Shingles Vaccine? Yes   Zostavax completed No   Shingrix Completed?: No.    Education has been provided regarding the importance of this vaccine. Patient has been advised to call insurance company to determine out of pocket expense if they have not yet received this vaccine. Advised may also receive vaccine at local pharmacy or Health Dept. Verbalized acceptance and understanding.  Screening  Tests Health  Maintenance  Topic Date Due  . Hepatitis C Screening  Never done  . TETANUS/TDAP  04/19/2020 (Originally 01/22/1960)  . INFLUENZA VACCINE  11/18/2019  . MAMMOGRAM  02/20/2020  . DEXA SCAN  Completed  . COVID-19 Vaccine  Completed  . PNA vac Low Risk Adult  Completed    Health Maintenance  Health Maintenance Due  Topic Date Due  . Hepatitis C Screening  Never done    Colorectal cancer screening: No longer required.    Mammogram status: Completed 02/20/19. Repeat every year   Bone Density status: Completed 11/30/17. Results reflect: Bone density results: OSTEOPOROSIS. Repeat every 2 years. Pt to discuss repeat screening options at next visit.   Lung Cancer Screening: (Low Dose CT Chest recommended if Age 9-80 years, 30 pack-year currently smoking OR have quit w/in 15years.) does not qualify.   Additional Screening:  Hepatitis C Screening: does qualify; postponed  Vision Screening: Recommended annual ophthalmology exams for early detection of glaucoma and other disorders of the eye. Is the patient up to date with their annual eye exam?  No  Who is the provider or what is the name of the office in which the patient attends annual eye exams? Not established If pt is not established with a provider, would they like to be referred to a provider to establish care? No .   Dental Screening: Recommended annual dental exams for proper oral hygiene  Community Resource Referral / Chronic Care Management: CRR required this visit?  No   CCM required this visit?  No      Plan:     I have personally reviewed and noted the following in the patient's chart:   . Medical and social history . Use of alcohol, tobacco or illicit drugs  . Current medications and supplements . Functional ability and status . Nutritional status . Physical activity . Advanced directives . List of other physicians . Hospitalizations, surgeries, and ER visits in previous 12  months . Vitals . Screenings to include cognitive, depression, and falls . Referrals and appointments  In addition, I have reviewed and discussed with patient certain preventive protocols, quality metrics, and best practice recommendations. A written personalized care plan for preventive services as well as general preventive health recommendations were provided to patient.     Clemetine Marker, LPN   10/15/6379   Nurse Notes: pt doing well and appreciative of visit today.

## 2019-11-12 NOTE — Patient Instructions (Signed)
Rachel Lopez , Thank you for taking time to come for your Medicare Wellness Visit. I appreciate your ongoing commitment to your health goals. Please review the following plan we discussed and let me know if I can assist you in the future.   Screening recommendations/referrals: Colonoscopy: no longer required Mammogram: done 02/20/19 Bone Density: done 11/30/17. Please discuss at your next office visit.  Recommended yearly ophthalmology/optometry visit for glaucoma screening and checkup Recommended yearly dental visit for hygiene and checkup  Vaccinations: Influenza vaccine: done 01/15/19 Pneumococcal vaccine: done 10/18/16 Tdap vaccine: due Shingles vaccine: Shingrix discussed. Please contact your pharmacy for coverage information.  Covid-19: done 06/14/19 & 07/10/19   Advanced directives: Advance directive discussed with you today. I have provided a copy for you to complete at home and have notarized. Once this is complete please bring a copy in to our office so we can scan it into your chart.  Conditions/risks identified: Keep up the great work!  Next appointment: Follow up in one year for your annual wellness visit    Preventive Care 65 Years and Older, Female Preventive care refers to lifestyle choices and visits with your health care provider that can promote health and wellness. What does preventive care include?  A yearly physical exam. This is also called an annual well check.  Dental exams once or twice a year.  Routine eye exams. Ask your health care provider how often you should have your eyes checked.  Personal lifestyle choices, including:  Daily care of your teeth and gums.  Regular physical activity.  Eating a healthy diet.  Avoiding tobacco and drug use.  Limiting alcohol use.  Practicing safe sex.  Taking low-dose aspirin every day.  Taking vitamin and mineral supplements as recommended by your health care provider. What happens during an annual well  check? The services and screenings done by your health care provider during your annual well check will depend on your age, overall health, lifestyle risk factors, and family history of disease. Counseling  Your health care provider may ask you questions about your:  Alcohol use.  Tobacco use.  Drug use.  Emotional well-being.  Home and relationship well-being.  Sexual activity.  Eating habits.  History of falls.  Memory and ability to understand (cognition).  Work and work Statistician.  Reproductive health. Screening  You may have the following tests or measurements:  Height, weight, and BMI.  Blood pressure.  Lipid and cholesterol levels. These may be checked every 5 years, or more frequently if you are over 19 years old.  Skin check.  Lung cancer screening. You may have this screening every year starting at age 54 if you have a 30-pack-year history of smoking and currently smoke or have quit within the past 15 years.  Fecal occult blood test (FOBT) of the stool. You may have this test every year starting at age 42.  Flexible sigmoidoscopy or colonoscopy. You may have a sigmoidoscopy every 5 years or a colonoscopy every 10 years starting at age 52.  Hepatitis C blood test.  Hepatitis B blood test.  Sexually transmitted disease (STD) testing.  Diabetes screening. This is done by checking your blood sugar (glucose) after you have not eaten for a while (fasting). You may have this done every 1-3 years.  Bone density scan. This is done to screen for osteoporosis. You may have this done starting at age 1.  Mammogram. This may be done every 1-2 years. Talk to your health care provider about how often  you should have regular mammograms. Talk with your health care provider about your test results, treatment options, and if necessary, the need for more tests. Vaccines  Your health care provider may recommend certain vaccines, such as:  Influenza vaccine. This is  recommended every year.  Tetanus, diphtheria, and acellular pertussis (Tdap, Td) vaccine. You may need a Td booster every 10 years.  Zoster vaccine. You may need this after age 61.  Pneumococcal 13-valent conjugate (PCV13) vaccine. One dose is recommended after age 62.  Pneumococcal polysaccharide (PPSV23) vaccine. One dose is recommended after age 23. Talk to your health care provider about which screenings and vaccines you need and how often you need them. This information is not intended to replace advice given to you by your health care provider. Make sure you discuss any questions you have with your health care provider. Document Released: 05/02/2015 Document Revised: 12/24/2015 Document Reviewed: 02/04/2015 Elsevier Interactive Patient Education  2017 [place] Prevention in the Home Falls can cause injuries. They can happen to people of all ages. There are many things you can do to make your home safe and to help prevent falls. What can I do on the outside of my home?  Regularly fix the edges of walkways and driveways and fix any cracks.  Remove anything that might make you trip as you walk through a door, such as a raised step or threshold.  Trim any bushes or trees on the path to your home.  Use bright outdoor lighting.  Clear any walking paths of anything that might make someone trip, such as rocks or tools.  Regularly check to see if handrails are loose or broken. Make sure that both sides of any steps have handrails.  Any raised decks and porches should have guardrails on the edges.  Have any leaves, snow, or ice cleared regularly.  Use sand or salt on walking paths during winter.  Clean up any spills in your garage right away. This includes oil or grease spills. What can I do in the bathroom?  Use night lights.  Install grab bars by the toilet and in the tub and shower. Do not use towel bars as grab bars.  Use non-skid mats or decals in the tub or  shower.  If you need to sit down in the shower, use a plastic, non-slip stool.  Keep the floor dry. Clean up any water that spills on the floor as soon as it happens.  Remove soap buildup in the tub or shower regularly.  Attach bath mats securely with double-sided non-slip rug tape.  Do not have throw rugs and other things on the floor that can make you trip. What can I do in the bedroom?  Use night lights.  Make sure that you have a light by your bed that is easy to reach.  Do not use any sheets or blankets that are too big for your bed. They should not hang down onto the floor.  Have a firm chair that has side arms. You can use this for support while you get dressed.  Do not have throw rugs and other things on the floor that can make you trip. What can I do in the kitchen?  Clean up any spills right away.  Avoid walking on wet floors.  Keep items that you use a lot in easy-to-reach places.  If you need to reach something above you, use a strong step stool that has a grab bar.  Keep electrical cords  out of the way.  Do not use floor polish or wax that makes floors slippery. If you must use wax, use non-skid floor wax.  Do not have throw rugs and other things on the floor that can make you trip. What can I do with my stairs?  Do not leave any items on the stairs.  Make sure that there are handrails on both sides of the stairs and use them. Fix handrails that are broken or loose. Make sure that handrails are as long as the stairways.  Check any carpeting to make sure that it is firmly attached to the stairs. Fix any carpet that is loose or worn.  Avoid having throw rugs at the top or bottom of the stairs. If you do have throw rugs, attach them to the floor with carpet tape.  Make sure that you have a light switch at the top of the stairs and the bottom of the stairs. If you do not have them, ask someone to add them for you. What else can I do to help prevent  falls?  Wear shoes that:  Do not have high heels.  Have rubber bottoms.  Are comfortable and fit you well.  Are closed at the toe. Do not wear sandals.  If you use a stepladder:  Make sure that it is fully opened. Do not climb a closed stepladder.  Make sure that both sides of the stepladder are locked into place.  Ask someone to hold it for you, if possible.  Clearly mark and make sure that you can see:  Any grab bars or handrails.  First and last steps.  Where the edge of each step is.  Use tools that help you move around (mobility aids) if they are needed. These include:  Canes.  Walkers.  Scooters.  Crutches.  Turn on the lights when you go into a dark area. Replace any light bulbs as soon as they burn out.  Set up your furniture so you have a clear path. Avoid moving your furniture around.  If any of your floors are uneven, fix them.  If there are any pets around you, be aware of where they are.  Review your medicines with your doctor. Some medicines can make you feel dizzy. This can increase your chance of falling. Ask your doctor what other things that you can do to help prevent falls. This information is not intended to replace advice given to you by your health care provider. Make sure you discuss any questions you have with your health care provider. Document Released: 01/30/2009 Document Revised: 09/11/2015 Document Reviewed: 05/10/2014 Elsevier Interactive Patient Education  2017 [employer].

## 2019-12-21 ENCOUNTER — Ambulatory Visit: Payer: Medicare Other | Admitting: Internal Medicine

## 2019-12-25 ENCOUNTER — Other Ambulatory Visit: Payer: Self-pay

## 2019-12-25 ENCOUNTER — Ambulatory Visit (INDEPENDENT_AMBULATORY_CARE_PROVIDER_SITE_OTHER): Payer: Medicare Other | Admitting: Internal Medicine

## 2019-12-25 ENCOUNTER — Encounter: Payer: Self-pay | Admitting: Internal Medicine

## 2019-12-25 VITALS — BP 104/64 | HR 91 | Temp 98.2°F | Ht 64.0 in | Wt 131.0 lb

## 2019-12-25 DIAGNOSIS — N183 Chronic kidney disease, stage 3 unspecified: Secondary | ICD-10-CM | POA: Diagnosis not present

## 2019-12-25 DIAGNOSIS — I10 Essential (primary) hypertension: Secondary | ICD-10-CM | POA: Diagnosis not present

## 2019-12-25 DIAGNOSIS — E890 Postprocedural hypothyroidism: Secondary | ICD-10-CM

## 2019-12-25 DIAGNOSIS — Z23 Encounter for immunization: Secondary | ICD-10-CM | POA: Diagnosis not present

## 2019-12-25 DIAGNOSIS — E782 Mixed hyperlipidemia: Secondary | ICD-10-CM | POA: Diagnosis not present

## 2019-12-25 MED ORDER — LISINOPRIL 40 MG PO TABS: 40.0000 mg | ORAL_TABLET | Freq: Every day | ORAL | 3 refills | Status: DC

## 2019-12-25 MED ORDER — ATORVASTATIN CALCIUM 20 MG PO TABS: 20.0000 mg | ORAL_TABLET | Freq: Every day | ORAL | 3 refills | Status: DC

## 2019-12-25 MED ORDER — LEVOTHYROXINE SODIUM 75 MCG PO TABS: 75.0000 ug | ORAL_TABLET | Freq: Every day | ORAL | 3 refills | Status: DC

## 2019-12-25 NOTE — Progress Notes (Signed)
Date:  12/25/2019   Name:  Rachel Lopez   DOB:  [birth year]   MRN:  [id number]   Chief Complaint: Hypertension (follow up / flu shot )  Hypertension This is a chronic problem. The problem is controlled. Pertinent negatives include no chest pain, headaches, palpitations or shortness of breath. There are no known risk factors for coronary artery disease. Past treatments include ACE inhibitors. The current treatment provides significant improvement. Hypertensive end-organ damage includes kidney disease (GFR 38). Identifiable causes of hypertension include a thyroid problem.  Thyroid Problem Presents for follow-up visit. Patient reports no fatigue, palpitations or tremors. The symptoms have been stable (dose reduced last visit).    Lab Results  Component Value Date   CREATININE 1.34 (H) 06/19/2019   BUN 11 06/19/2019   NA 137 06/19/2019   K 4.9 06/19/2019   CL 100 06/19/2019   CO2 24 06/19/2019   Lab Results  Component Value Date   CHOL 194 06/19/2019   HDL 57 06/19/2019   LDLCALC 115 (H) 06/19/2019   TRIG 125 06/19/2019   CHOLHDL 3.4 06/19/2019   Lab Results  Component Value Date   TSH 0.364 (L) 06/19/2019   No results found for: HGBA1C Lab Results  Component Value Date   WBC 7.0 06/19/2019   HGB 15.4 06/19/2019   HCT 45.1 06/19/2019   MCV 89 06/19/2019   PLT 279 06/19/2019   Lab Results  Component Value Date   ALT 8 06/19/2019   AST 18 06/19/2019   ALKPHOS 128 (H) 06/19/2019   BILITOT 0.5 06/19/2019     Review of Systems  Constitutional: Negative for appetite change, fatigue, fever and unexpected weight change.  HENT: Negative for trouble swallowing.   Eyes: Negative for visual disturbance.  Respiratory: Negative for cough, chest tightness and shortness of breath.   Cardiovascular: Negative for chest pain, palpitations and leg swelling.  Gastrointestinal: Negative for abdominal pain.  Genitourinary: Negative for dysuria and hematuria.  Musculoskeletal:  Negative for arthralgias.  Neurological: Negative for tremors, numbness and headaches.  Psychiatric/Behavioral: Negative for dysphoric mood.    Patient Active Problem List   Diagnosis Date Noted  . Arthritis of knee, right 11/03/2017  . Chronic renal insufficiency, stage 3 (moderate) 04/21/2016  . Angiomyolipoma of kidney 01/01/2015  . HLD (hyperlipidemia) 10/18/2014  . Benign hypertension 10/18/2014  . Hypothyroidism, postablative 10/18/2014  . Intraductal papilloma of left breast 10/18/2014    No Known Allergies  Past Surgical History:  Procedure Laterality Date  . APPENDECTOMY     50yr  . BREAST BIOPSY Left 10/15/2014   papilloma -u/s bx /clip  . BREAST EXCISIONAL BIOPSY Left 04/2015   papilloma removed-neg  . HEMORRHOID SURGERY  2014  . LAPAROSCOPIC NEPHRECTOMY, HAND ASSISTED Right 04/16/2015   Procedure: HAND ASSISTED LAPAROSCOPIC NEPHRECTOMY;  Surgeon: Nickie Retort, MD;  Location: [facility] ORS;  Service: Urology;  Laterality: Right;  . RADICAL HYSTERECTOMY  1969  . TOTAL THYROIDECTOMY     age 12    Social History   Tobacco Use  . Smoking status: Former Smoker    Packs/day: 0.50    Years: 15.00    Pack years: 7.50    Types: Cigarettes    Quit date: 02/18/1979    Years since quitting: 40.8  . Smokeless tobacco: Never Used  . Tobacco comment: smoking cessation materials not required  Vaping Use  . Vaping Use: Never used  Substance Use Topics  . Alcohol use: No  . Drug use: No  Medication list has been reviewed and updated.  Current Meds  Medication Sig  . ACETAMINOPHEN PO Take by mouth as needed. Tylenol  . atorvastatin (LIPITOR) 20 MG tablet Take 1 tablet (20 mg total) by mouth at bedtime.  Marland Kitchen levothyroxine (SYNTHROID) 75 MCG tablet Take 1 tablet (75 mcg total) by mouth daily.  Marland Kitchen lisinopril (ZESTRIL) 40 MG tablet Take 1 tablet (40 mg total) by mouth daily.  . [DISCONTINUED] atorvastatin (LIPITOR) 20 MG tablet TAKE 1 TABLET BY MOUTH EVERY NIGHT AT  BEDTIME  . [DISCONTINUED] levothyroxine (SYNTHROID) 88 MCG tablet Take 75 mcg by mouth daily.   . [DISCONTINUED] lisinopril (ZESTRIL) 40 MG tablet TAKE 1 TABLET BY MOUTH DAILY    PHQ 2/9 Scores 11/12/2019 06/19/2019 10/25/2018 05/10/2018  PHQ - 2 Score 0 0 0 0  PHQ- 9 Score - - - -    No flowsheet data found.  BP Readings from Last 3 Encounters:  12/25/19 104/64  11/12/19 112/72  06/19/19 122/82    Physical Exam Vitals and nursing note reviewed.  Constitutional:      General: She is not in acute distress.    Appearance: Normal appearance. She is well-developed.  HENT:     Head: Normocephalic and atraumatic.  Cardiovascular:     Rate and Rhythm: Normal rate and regular rhythm.     Pulses: Normal pulses.  Pulmonary:     Effort: Pulmonary effort is normal. No respiratory distress.     Breath sounds: No wheezing or rhonchi.  Musculoskeletal:        General: Normal range of motion.     Cervical back: Normal range of motion.     Right lower leg: No edema.     Left lower leg: No edema.  Lymphadenopathy:     Cervical: No cervical adenopathy.  Skin:    General: Skin is warm and dry.     Capillary Refill: Capillary refill takes less than 2 seconds.     Findings: No rash.  Neurological:     Mental Status: She is alert and oriented to person, place, and time.  Psychiatric:        Mood and Affect: Mood normal.     Wt Readings from Last 3 Encounters:  12/25/19 131 lb (59.4 kg)  11/12/19 131 lb (59.4 kg)  06/19/19 133 lb (60.3 kg)    BP 104/64   Pulse 91   Temp 98.2 F (36.8 C) (Oral)   Ht 5\' 4"  (1.626 m)   Wt 131 lb (59.4 kg)   SpO2 98%   BMI 22.49 kg/m   Assessment and Plan: 1. Benign hypertension Clinically stable exam with well controlled BP on lisinopril 40 mg. Tolerating medications without side effects at this time. Pt to continue current regimen and low sodium diet; benefits of regular exercise as able discussed.  2. Hypothyroidism, postablative Supplemented  - dose reduced last visit with no change in sx Weight is stable - TSH + free T4  3. Chronic renal insufficiency, stage 3 (moderate) Continue to monitor every 6 months - Basic metabolic panel  4. Mixed hyperlipidemia Tolerating statin medication without side effects at this time Continue same therapy without change at this time. - atorvastatin (LIPITOR) 20 MG tablet; Take 1 tablet (20 mg total) by mouth at bedtime.  Dispense: 90 tablet; Refill: 3   Partially dictated using Editor, commissioning. Any errors are unintentional.  Halina Maidens, MD [facility]  12/25/2019

## 2019-12-26 LAB — BASIC METABOLIC PANEL
BUN/Creatinine Ratio: 12 (ref 12–28)
BUN: 17 mg/dL (ref 8–27)
CO2: 20 mmol/L (ref 20–29)
Calcium: 9 mg/dL (ref 8.7–10.3)
Chloride: 100 mmol/L (ref 96–106)
Creatinine, Ser: 1.41 mg/dL — ABNORMAL HIGH (ref 0.57–1.00)
GFR calc Af Amer: 41 mL/min/{1.73_m2} — ABNORMAL LOW (ref >59)
GFR calc non Af Amer: 36 mL/min/{1.73_m2} — ABNORMAL LOW (ref >59)
Glucose: 94 mg/dL (ref 65–99)
Potassium: 5.2 mmol/L (ref 3.5–5.2)
Sodium: 136 mmol/L (ref 134–144)

## 2019-12-26 LAB — TSH+FREE T4
Free T4: 1.98 ng/dL — ABNORMAL HIGH (ref 0.82–1.77)
TSH: 0.599 u[IU]/mL (ref 0.450–4.500)

## 2019-12-28 ENCOUNTER — Other Ambulatory Visit: Payer: Self-pay | Admitting: Internal Medicine

## 2019-12-28 DIAGNOSIS — E782 Mixed hyperlipidemia: Secondary | ICD-10-CM

## 2020-03-11 ENCOUNTER — Other Ambulatory Visit: Payer: Self-pay

## 2020-03-11 ENCOUNTER — Ambulatory Visit
Admission: RE | Admit: 2020-03-11 | Discharge: 2020-03-11 | Disposition: A | Discharge status: Home / Self Care | Payer: Medicare Other | Source: Ambulatory Visit | Attending: Internal Medicine | Admitting: Internal Medicine

## 2020-03-11 DIAGNOSIS — Z1231 Encounter for screening mammogram for malignant neoplasm of breast: Secondary | ICD-10-CM | POA: Diagnosis not present

## 2020-06-23 ENCOUNTER — Other Ambulatory Visit: Payer: Self-pay

## 2020-06-23 ENCOUNTER — Ambulatory Visit (INDEPENDENT_AMBULATORY_CARE_PROVIDER_SITE_OTHER): Payer: Medicare Other | Admitting: Internal Medicine

## 2020-06-23 ENCOUNTER — Encounter: Payer: Self-pay | Admitting: Internal Medicine

## 2020-06-23 VITALS — BP 132/76 | HR 95 | Temp 97.5°F | Ht 64.0 in | Wt 126.0 lb

## 2020-06-23 DIAGNOSIS — E782 Mixed hyperlipidemia: Secondary | ICD-10-CM

## 2020-06-23 DIAGNOSIS — E890 Postprocedural hypothyroidism: Secondary | ICD-10-CM

## 2020-06-23 DIAGNOSIS — Z1159 Encounter for screening for other viral diseases: Secondary | ICD-10-CM

## 2020-06-23 DIAGNOSIS — Z0000 Encounter for general adult medical examination without abnormal findings: Secondary | ICD-10-CM | POA: Diagnosis not present

## 2020-06-23 DIAGNOSIS — I10 Essential (primary) hypertension: Secondary | ICD-10-CM | POA: Diagnosis not present

## 2020-06-23 NOTE — Progress Notes (Signed)
Date:  06/23/2020   Name:  Rachel Lopez   DOB:  02-20-1941   MRN:  [id number]   Chief Complaint: Annual Exam (Breast Exam. No pap-discontinued.)  Rachel Lopez is a 80 y.o. female who presents today for her Complete Annual Exam. She feels well. She reports exercising - walking the dog daily. She reports she is sleeping well. Breast complaints - none.  Mammogram: 02/2020 DEXA: 11/2017 Colonoscopy: 2013  Immunization History  Administered Date(s) Administered  . Fluad Quad(high Dose 65+) 12/25/2019  . Influenza, High Dose Seasonal PF 01/21/2017, 01/15/2019  . Influenza-Unspecified 01/27/2015, 02/15/2018  . PFIZER(Purple Top)SARS-COV-2 Vaccination 06/14/2019, 07/10/2019  . Pneumococcal Conjugate-13 09/10/2014  . Pneumococcal Polysaccharide-23 10/18/2016    Hypertension This is a chronic problem. The problem is controlled. Pertinent negatives include no chest pain, headaches, palpitations or shortness of breath. Past treatments include ACE inhibitors. The current treatment provides significant improvement. There are no compliance problems.  Identifiable causes of hypertension include a thyroid problem.  Hyperlipidemia This is a chronic problem. The problem is controlled. Pertinent negatives include no chest pain or shortness of breath. Current antihyperlipidemic treatment includes statins. The current treatment provides significant improvement of lipids. Compliance problems: higher dose causes dizziness.   Thyroid Problem Presents for follow-up visit. Patient reports no anxiety, constipation, diarrhea, fatigue, palpitations or tremors. The symptoms have been stable. Her past medical history is significant for hyperlipidemia.    Lab Results  Component Value Date   CREATININE 1.41 (H) 12/25/2019   BUN 17 12/25/2019   NA 136 12/25/2019   K 5.2 12/25/2019   CL 100 12/25/2019   CO2 20 12/25/2019   Lab Results  Component Value Date   CHOL 194 06/19/2019   HDL 57 06/19/2019    LDLCALC 115 (H) 06/19/2019   TRIG 125 06/19/2019   CHOLHDL 3.4 06/19/2019   Lab Results  Component Value Date   TSH 0.599 12/25/2019   No results found for: HGBA1C Lab Results  Component Value Date   WBC 7.0 06/19/2019   HGB 15.4 06/19/2019   HCT 45.1 06/19/2019   MCV 89 06/19/2019   PLT 279 06/19/2019   Lab Results  Component Value Date   ALT 8 06/19/2019   AST 18 06/19/2019   ALKPHOS 128 (H) 06/19/2019   BILITOT 0.5 06/19/2019     Review of Systems  Constitutional: Negative for chills, fatigue and fever.  HENT: Negative for congestion, hearing loss, tinnitus, trouble swallowing and voice change.   Eyes: Negative for visual disturbance.  Respiratory: Negative for cough, chest tightness, shortness of breath and wheezing.   Cardiovascular: Negative for chest pain, palpitations and leg swelling.  Gastrointestinal: Negative for abdominal pain, constipation, diarrhea and vomiting.  Endocrine: Negative for polydipsia and polyuria.  Genitourinary: Negative for dysuria, frequency, genital sores, vaginal bleeding and vaginal discharge.  Musculoskeletal: Negative for arthralgias, gait problem and joint swelling.  Skin: Negative for color change and rash.  Neurological: Negative for dizziness, tremors, light-headedness and headaches.  Hematological: Negative for adenopathy. Does not bruise/bleed easily.  Psychiatric/Behavioral: Negative for dysphoric mood and sleep disturbance. The patient is not nervous/anxious.     Patient Active Problem List   Diagnosis Date Noted  . Arthritis of knee, right 11/03/2017  . Chronic renal insufficiency, stage 3 (moderate) ([place]) 04/21/2016  . Angiomyolipoma of kidney 01/01/2015  . HLD (hyperlipidemia) 10/18/2014  . Benign hypertension 10/18/2014  . Hypothyroidism, postablative 10/18/2014  . Intraductal papilloma of left breast 10/18/2014    No Known  Allergies  Past Surgical History:  Procedure Laterality Date  . ABDOMINAL HYSTERECTOMY     . APPENDECTOMY     33yr  . BREAST BIOPSY Left 10/15/2014   papilloma -u/s bx /clip  . BREAST EXCISIONAL BIOPSY Left 04/2015   papilloma removed-neg  . HEMORRHOID SURGERY  2014  . LAPAROSCOPIC NEPHRECTOMY, HAND ASSISTED Right 04/16/2015   Procedure: HAND ASSISTED LAPAROSCOPIC NEPHRECTOMY;  Surgeon: Nickie Retort, MD;  Location: [facility] ORS;  Service: Urology;  Laterality: Right;  . RADICAL HYSTERECTOMY  1969  . TOTAL THYROIDECTOMY     age 58    Social History   Tobacco Use  . Smoking status: Former Smoker    Packs/day: 0.50    Years: 15.00    Pack years: 7.50    Types: Cigarettes    Quit date: 02/18/1979    Years since quitting: 41.3  . Smokeless tobacco: Never Used  . Tobacco comment: smoking cessation materials not required  Vaping Use  . Vaping Use: Never used  Substance Use Topics  . Alcohol use: No  . Drug use: No     Medication list has been reviewed and updated.  Current Meds  Medication Sig  . ACETAMINOPHEN PO Take by mouth as needed. Tylenol  . atorvastatin (LIPITOR) 20 MG tablet Take 1 tablet (20 mg total) by mouth at bedtime. (Patient taking differently: Take 10 mg by mouth at bedtime.)  . levothyroxine (SYNTHROID) 75 MCG tablet Take 1 tablet (75 mcg total) by mouth daily.  Marland Kitchen lisinopril (ZESTRIL) 40 MG tablet Take 1 tablet (40 mg total) by mouth daily.    PHQ 2/9 Scores 06/23/2020 11/12/2019 06/19/2019 10/25/2018  PHQ - 2 Score 0 0 0 0  PHQ- 9 Score 0 - - -    GAD 7 : Generalized Anxiety Score 06/23/2020  Nervous, Anxious, on Edge 0  Control/stop worrying 0  Worry too much - different things 0  Trouble relaxing 0  Restless 0  Easily annoyed or irritable 0  Afraid - awful might happen 0  Total GAD 7 Score 0  Anxiety Difficulty Not difficult at all    BP Readings from Last 3 Encounters:  06/23/20 132/76  12/25/19 104/64  11/12/19 112/72    Physical Exam Vitals and nursing note reviewed.  Constitutional:      General: She is not in acute  distress.    Appearance: She is well-developed.  HENT:     Head: Normocephalic and atraumatic.     Right Ear: Tympanic membrane and ear canal normal.     Left Ear: Tympanic membrane and ear canal normal.     Nose:     Right Sinus: No maxillary sinus tenderness.     Left Sinus: No maxillary sinus tenderness.  Eyes:     General: No scleral icterus.       Right eye: No discharge.        Left eye: No discharge.     Conjunctiva/sclera: Conjunctivae normal.  Neck:     Thyroid: No thyromegaly.     Vascular: No carotid bruit.  Cardiovascular:     Rate and Rhythm: Normal rate and regular rhythm.     Pulses: Normal pulses.     Heart sounds: Normal heart sounds.  Pulmonary:     Effort: Pulmonary effort is normal. No respiratory distress.     Breath sounds: No wheezing.  Chest:  Breasts:     Right: No mass, nipple discharge, skin change or tenderness.     Left: No  mass, nipple discharge, skin change or tenderness.    Abdominal:     General: Bowel sounds are normal.     Palpations: Abdomen is soft.     Tenderness: There is no abdominal tenderness.  Musculoskeletal:     Cervical back: Normal range of motion. No erythema.     Right lower leg: No edema.     Left lower leg: No edema.  Lymphadenopathy:     Cervical: No cervical adenopathy.  Skin:    General: Skin is warm and dry.     Findings: No rash.  Neurological:     Mental Status: She is alert and oriented to person, place, and time.     Cranial Nerves: No cranial nerve deficit.     Sensory: No sensory deficit.     Deep Tendon Reflexes: Reflexes are normal and symmetric.  Psychiatric:        Attention and Perception: Attention normal.        Mood and Affect: Mood normal.    6CIT Screen 06/23/2020 11/12/2019 10/25/2018 10/19/2017 10/18/2016  What Year? 0 points 0 points 0 points 0 points 0 points  What month? 0 points 0 points 0 points 0 points 0 points  What time? 0 points 0 points 0 points 0 points 0 points  Count back from 20 0  points 0 points 0 points 0 points 0 points  Months in reverse 0 points 0 points 0 points 0 points 0 points  Repeat phrase 2 points 10 points 4 points 4 points 0 points  Total Score 2 10 4 4  0      Wt Readings from Last 3 Encounters:  06/23/20 126 lb (57.2 kg)  12/25/19 131 lb (59.4 kg)  11/12/19 131 lb (59.4 kg)    BP 132/76   Pulse 95   Temp (!) 97.5 F (36.4 C) (Oral)   Ht 5\' 4"  (1.626 m)   Wt 126 lb (57.2 kg)   SpO2 98%   BMI 21.63 kg/m   Assessment and Plan: 1. Annual physical exam Normal exam Continue exercise and healthy diet - POCT urinalysis dipstick  2. Benign hypertension Clinically stable exam with well controlled BP. Tolerating medications without side effects at this time. Pt to continue current regimen and low sodium diet; benefits of regular exercise as able discussed. - CBC with Differential/Platelet - Comprehensive metabolic panel  3. Hypothyroidism, postablative supplemented - TSH + free T4  4. Mixed hyperlipidemia Tolerating lower dose atorvastatin 10 mg.  Could not tolerated 20 mg due to dizziness. - Lipid panel  5. Need for hepatitis C screening test - Hepatitis C antibody   Partially dictated using Editor, commissioning. Any errors are unintentional.  Halina Maidens, MD [facility]  06/23/2020

## 2020-06-24 LAB — CBC WITH DIFFERENTIAL/PLATELET
Basophils Absolute: 0.1 10*3/uL (ref 0.0–0.2)
Basos: 1 %
EOS (ABSOLUTE): 0.2 10*3/uL (ref 0.0–0.4)
Eos: 2 %
Hematocrit: 43.6 % (ref 34.0–46.6)
Hemoglobin: 14.2 g/dL (ref 11.1–15.9)
Immature Grans (Abs): 0 10*3/uL (ref 0.0–0.1)
Immature Granulocytes: 1 %
Lymphocytes Absolute: 0.7 10*3/uL (ref 0.7–3.1)
Lymphs: 9 %
MCH: 29.4 pg (ref 26.6–33.0)
MCHC: 32.6 g/dL (ref 31.5–35.7)
MCV: 90 fL (ref 79–97)
Monocytes Absolute: 0.6 10*3/uL (ref 0.1–0.9)
Monocytes: 8 %
Neutrophils Absolute: 6.4 10*3/uL (ref 1.4–7.0)
Neutrophils: 79 %
Platelets: 310 10*3/uL (ref 150–450)
RBC: 4.83 x10E6/uL (ref 3.77–5.28)
RDW: 12.5 % (ref 11.7–15.4)
WBC: 8 10*3/uL (ref 3.4–10.8)

## 2020-06-24 LAB — TSH+FREE T4
Free T4: 2.1 ng/dL — ABNORMAL HIGH (ref 0.82–1.77)
TSH: 2.54 u[IU]/mL (ref 0.450–4.500)

## 2020-06-24 LAB — COMPREHENSIVE METABOLIC PANEL
ALT: 12 IU/L (ref 0–32)
AST: 16 IU/L (ref 0–40)
Albumin/Globulin Ratio: 1.8 (ref 1.2–2.2)
Albumin: 4.3 g/dL (ref 3.7–4.7)
Alkaline Phosphatase: 120 IU/L (ref 44–121)
BUN/Creatinine Ratio: 11 — ABNORMAL LOW (ref 12–28)
BUN: 14 mg/dL (ref 8–27)
Bilirubin Total: 0.2 mg/dL (ref 0.0–1.2)
CO2: 22 mmol/L (ref 20–29)
Calcium: 8.9 mg/dL (ref 8.7–10.3)
Chloride: 100 mmol/L (ref 96–106)
Creatinine, Ser: 1.33 mg/dL — ABNORMAL HIGH (ref 0.57–1.00)
Globulin, Total: 2.4 g/dL (ref 1.5–4.5)
Glucose: 83 mg/dL (ref 65–99)
Potassium: 4.9 mmol/L (ref 3.5–5.2)
Sodium: 137 mmol/L (ref 134–144)
Total Protein: 6.7 g/dL (ref 6.0–8.5)
eGFR: 41 mL/min/{1.73_m2} — ABNORMAL LOW (ref >59)

## 2020-06-24 LAB — LIPID PANEL
Chol/HDL Ratio: 3.7 ratio (ref 0.0–4.4)
Cholesterol, Total: 210 mg/dL — ABNORMAL HIGH (ref 100–199)
HDL: 57 mg/dL (ref >39)
LDL Chol Calc (NIH): 131 mg/dL — ABNORMAL HIGH (ref 0–99)
Triglycerides: 121 mg/dL (ref 0–149)
VLDL Cholesterol Cal: 22 mg/dL (ref 5–40)

## 2020-06-24 LAB — HEPATITIS C ANTIBODY: Hep C Virus Ab: <0.1 s/co ratio (ref 0.0–0.9)

## 2020-10-09 ENCOUNTER — Other Ambulatory Visit: Payer: Self-pay | Admitting: Internal Medicine

## 2020-10-09 DIAGNOSIS — I10 Essential (primary) hypertension: Secondary | ICD-10-CM

## 2020-10-09 NOTE — Telephone Encounter (Signed)
   Notes to clinic:  Review for refill Patient should have enough until 12/2020 Script shows last filled on 11/07/2019   Requested Prescriptions  Pending Prescriptions Disp Refills   lisinopril (ZESTRIL) 40 MG tablet [Pharmacy Med Name: LISINOPRIL 40MG  TABLETS] 90 tablet 3    Sig: TAKE 1 TABLET BY MOUTH DAILY      Cardiovascular:  ACE Inhibitors Failed - 10/09/2020  3:18 AM      Failed - Cr in normal range and within 180 days    Creatinine, Ser  Date Value Ref Range Status  06/23/2020 1.33 (H) 0.57 - 1.00 mg/dL Final          Passed - K in normal range and within 180 days    Potassium  Date Value Ref Range Status  06/23/2020 4.9 3.5 - 5.2 mmol/L Final          Passed - Patient is not pregnant      Passed - Last BP in normal range    BP Readings from Last 1 Encounters:  06/23/20 132/76          Passed - Valid encounter within last 6 months    Recent Outpatient Visits           3 months ago Annual physical exam   [facility] Glean Hess, MD   9 months ago Benign hypertension   [facility] Glean Hess, MD   1 year ago Annual physical exam   [facility] Glean Hess, MD   2 years ago Benign hypertension   [facility] Glean Hess, MD   2 years ago Annual physical exam   [facility] Glean Hess, MD

## 2020-11-12 ENCOUNTER — Ambulatory Visit: Payer: Medicare Other

## 2020-11-24 ENCOUNTER — Ambulatory Visit (INDEPENDENT_AMBULATORY_CARE_PROVIDER_SITE_OTHER): Payer: Medicare Other

## 2020-11-24 DIAGNOSIS — Z0000 Encounter for general adult medical examination without abnormal findings: Secondary | ICD-10-CM

## 2020-11-24 NOTE — Progress Notes (Signed)
Subjective:   Rachel Lopez is a 80 y.o. female who presents for Medicare Annual (Subsequent) preventive examination.  Virtual Visit via Telephone Note  I connected with  Rachel Lopez on 11/24/20 at 11:20 AM EDT by telephone and verified that I am speaking with the correct person using two identifiers.  Location: Patient: home Provider: [facility] Persons participating in the virtual visit: [place]   I discussed the limitations, risks, security and privacy concerns of performing an evaluation and management service by telephone and the availability of in person appointments. The patient expressed understanding and agreed to proceed.  Interactive audio and video telecommunications were attempted between this nurse and patient, however failed, due to patient having technical difficulties OR patient did not have access to video capability.  We continued and completed visit with audio only.  Some vital signs may be absent or patient reported.   Clemetine Marker, LPN   Review of Systems     Cardiac Risk Factors include: advanced age (>28mn, >>85women);dyslipidemia;hypertension     Objective:    There were no vitals filed for this visit. There is no height or weight on file to calculate BMI.  Advanced Directives 11/24/2020 11/12/2019 10/25/2018 10/19/2017 10/18/2016 04/16/2015 04/03/2015  Does Patient Have a Medical Advance Directive? No No No Yes No Yes -  Type of Advance Directive - - -Public librarianLiving will - Living will Living will  Does patient want to make changes to medical advance directive? - - - - - No - Patient declined -  Copy of HLynndylin Chart? - - - No - copy requested - No - copy requested No - copy requested  Would patient like information on creating a medical advance directive? Yes (MAU/Ambulatory/Procedural Areas - Information given) Yes (MAU/Ambulatory/Procedural Areas - Information given) Yes  (MAU/Ambulatory/Procedural Areas - Information given) - Yes (MAU/Ambulatory/Procedural Areas - Information given) - -    Current Medications (verified) Outpatient Encounter Medications as of 11/24/2020  Medication Sig   ACETAMINOPHEN PO Take by mouth as needed. Tylenol   atorvastatin (LIPITOR) 20 MG tablet Take 1 tablet (20 mg total) by mouth at bedtime.   levothyroxine (SYNTHROID) 75 MCG tablet Take 1 tablet (75 mcg total) by mouth daily.   lisinopril (ZESTRIL) 40 MG tablet Take 1 tablet (40 mg total) by mouth daily.   No facility-administered encounter medications on file as of 11/24/2020.    Allergies (verified) Patient has no known allergies.   History: Past Medical History:  Diagnosis Date   Arthritis    Benign hypertension 10/18/2014   Chronic kidney disease    HLD (hyperlipidemia) 10/18/2014   Hyperlipidemia    Hypertension    Hypothyroidism, postablative 10/18/2014   Papilloma of breast 10/28/2014   Renal mass, right 01/01/2015   Thyroid disease    Past Surgical History:  Procedure Laterality Date   ABDOMINAL HYSTERECTOMY     APPENDECTOMY     89yr BREAST BIOPSY Left 10/15/2014   papilloma -u/s bx /clip   BREAST EXCISIONAL BIOPSY Left 04/2015   papilloma removed-neg   HEMORRHOID SURGERY  2014   LAPAROSCOPIC NEPHRECTOMY, HAND ASSISTED Right 04/16/2015   Procedure: HAND ASSISTED LAPAROSCOPIC NEPHRECTOMY;  Surgeon: BrNickie RetortMD;  Location: [facility] ORS;  Service: Urology;  Laterality: Right;   RADICAL HYSTERECTOMY  1969   TOTAL THYROIDECTOMY     age 80 Family History  Problem Relation Age of Onset   Healthy Mother  Healthy Father    Prostate cancer Neg Hx    Breast cancer Neg Hx    Bladder Cancer Neg Hx    Kidney cancer Neg Hx    Social History   Socioeconomic History   Marital status: Widowed    Spouse name: Not on file   Number of children: 1   Years of education: Not on file   Highest education level: 12th grade  Occupational History    Occupation: Retired  Tobacco Use   Smoking status: Former    Packs/day: 0.50    Years: 15.00    Pack years: 7.50    Types: Cigarettes    Quit date: 02/18/1979    Years since quitting: 41.7   Smokeless tobacco: Never   Tobacco comments:    smoking cessation materials not required  Vaping Use   Vaping Use: Never used  Substance and Sexual Activity   Alcohol use: No   Drug use: No   Sexual activity: Not Currently  Other Topics Concern   Not on file  Social History Narrative   Pt is originally from [place]. Moved to [place] in 1969. Lives alone but son lives next door.    Social Determinants of Health   Financial Resource Strain: Low Risk    Difficulty of Paying Living Expenses: Not hard at all  Food Insecurity: No Food Insecurity   Worried About Charity fundraiser in the Last Year: Never true   [place] in the Last Year: Never true  Transportation Needs: No Transportation Needs   Lack of Transportation (Medical): No   Lack of Transportation (Non-Medical): No  Physical Activity: Insufficiently Active   Days of Exercise per Week: 7 days   Minutes of Exercise per Session: 20 min  Stress: No Stress Concern Present   Feeling of Stress : Not at all  Social Connections: Socially Isolated   Frequency of Communication with Friends and Family: More than three times a week   Frequency of Social Gatherings with Friends and Family: Three times a week   Attends Religious Services: Never   Active Member of Clubs or Organizations: No   Attends Archivist Meetings: Never   Marital Status: Widowed    Tobacco Counseling Counseling given: Not Answered Tobacco comments: smoking cessation materials not required   Clinical Intake:  Pre-visit preparation completed: Yes  Pain : No/denies pain     Nutritional Risks: None Diabetes: No  How often do you need to have someone help you when you read instructions, pamphlets, or other written materials from your  doctor or pharmacy?: 1 - Never    Interpreter Needed?: No  Information entered by :: Clemetine Marker LPN   Activities of Daily Living In your present state of health, do you have any difficulty performing the following activities: 11/24/2020  Hearing? N  Vision? N  Difficulty concentrating or making decisions? Y  Walking or climbing stairs? N  Dressing or bathing? N  Doing errands, shopping? N  Preparing Food and eating ? N  Using the Toilet? N  In the past six months, have you accidently leaked urine? N  Do you have problems with loss of bowel control? N  Managing your Medications? N  Managing your Finances? N  Housekeeping or managing your Housekeeping? N  Some recent data might be hidden    Patient Care Team: Glean Hess, MD as PCP - General (Family Medicine)  Indicate any recent Medical Services you may have received  from other than [facility] providers in the past year (date may be approximate).     Assessment:   This is a routine wellness examination for Lynnel.  Hearing/Vision screen Hearing Screening - Comments:: Pt denies hearing difficulty Vision Screening - Comments:: Annual vision screenings complete per patient but does not recall provider  Dietary issues and exercise activities discussed: Current Exercise Habits: Home exercise routine, Type of exercise: walking, Time (Minutes): 20, Frequency (Times/Week): 7, Weekly Exercise (Minutes/Week): 140, Intensity: Mild, Exercise limited by: None identified   Goals Addressed   None    Depression Screen PHQ 2/9 Scores 11/24/2020 06/23/2020 11/12/2019 06/19/2019 10/25/2018 05/10/2018 10/19/2017  PHQ - 2 Score 0 0 0 0 0 0 0  PHQ- 9 Score - 0 - - - - 0    Fall Risk Fall Risk  11/24/2020 06/23/2020 11/12/2019 06/19/2019 10/25/2018  Falls in the past year? 0 0 0 0 0  Number falls in past yr: 0 0 0 0 0  Injury with Fall? 0 0 0 0 0  Risk for fall due to : No Fall Risks - No Fall Risks - -  Risk for fall due to: Comment - - - - -  Follow up  Falls prevention discussed Falls evaluation completed Falls prevention discussed Falls evaluation completed Falls prevention discussed    FALL RISK PREVENTION PERTAINING TO THE HOME:  Any stairs in or around the home? No  If so, are there any without handrails? No  Home free of loose throw rugs in walkways, pet beds, electrical cords, etc? Yes  Adequate lighting in your home to reduce risk of falls? Yes   ASSISTIVE DEVICES UTILIZED TO PREVENT FALLS:  Life alert? No  Use of a cane, walker or w/c? No  Grab bars in the bathroom? No  Shower chair or bench in shower? No  Elevated toilet seat or a handicapped toilet? No   TIMED UP AND GO:  Was the test performed? No . Telephonic visit.   Cognitive Function: Cognitive status assessed by direct observation. Patient states she has a hard time remembering things but for the most part doesn't feel like it is anything unusual for her age.She still drives and son lives next door. 6CIT completed 06/23/20.       6CIT Screen 06/23/2020 11/12/2019 10/25/2018 10/19/2017 10/18/2016  What Year? 0 points 0 points 0 points 0 points 0 points  What month? 0 points 0 points 0 points 0 points 0 points  What time? 0 points 0 points 0 points 0 points 0 points  Count back from 20 0 points 0 points 0 points 0 points 0 points  Months in reverse 0 points 0 points 0 points 0 points 0 points  Repeat phrase 2 points 10 points 4 points 4 points 0 points  Total Score '2 10 4 4 '$ 0    Immunizations Immunization History  Administered Date(s) Administered   Fluad Quad(high Dose 65+) 12/25/2019   Influenza, High Dose Seasonal PF 01/21/2017, 01/15/2019   Influenza-Unspecified 01/27/2015, 02/15/2018   Moderna SARS-COV2 Booster Vaccination 07/06/2020   PFIZER(Purple Top)SARS-COV-2 Vaccination 06/14/2019, 07/10/2019   Pneumococcal Conjugate-13 09/10/2014   Pneumococcal Polysaccharide-23 10/18/2016    TDAP status: Due, Education has been provided regarding the importance of  this vaccine. Advised may receive this vaccine at local pharmacy or Health Dept. Aware to provide a copy of the vaccination record if obtained from local pharmacy or Health Dept. Verbalized acceptance and understanding.  Flu Vaccine status: Up to date  Pneumococcal vaccine status:  Up to date  Covid-19 vaccine status: Completed vaccines  Qualifies for Shingles Vaccine? Yes   Zostavax completed No   Shingrix Completed?: No.    Education has been provided regarding the importance of this vaccine. Patient has been advised to call insurance company to determine out of pocket expense if they have not yet received this vaccine. Advised may also receive vaccine at local pharmacy or Health Dept. Verbalized acceptance and understanding.  Screening Tests Health Maintenance  Topic Date Due   TETANUS/TDAP  Never done   Zoster Vaccines- Shingrix (1 of 2) Never done   COVID-19 Vaccine (4 - Booster) 10/06/2020   INFLUENZA VACCINE  11/17/2020   MAMMOGRAM  03/11/2021   DEXA SCAN  Completed   Hepatitis C Screening  Completed   PNA vac Low Risk Adult  Completed   HPV VACCINES  Aged Out    Health Maintenance  Health Maintenance Due  Topic Date Due   TETANUS/TDAP  Never done   Zoster Vaccines- Shingrix (1 of 2) Never done   COVID-19 Vaccine (4 - Booster) 10/06/2020   INFLUENZA VACCINE  11/17/2020    Colorectal cancer screening: No longer required.   Mammogram status: Completed 03/11/20. Repeat every year  Bone Density status: Completed 11/30/17. Results reflect: Bone density results: OSTEOPOROSIS. Repeat every 2 years.  Lung Cancer Screening: (Low Dose CT Chest recommended if Age 36-80 years, 30 pack-year currently smoking OR have quit w/in 15years.) does not qualify.   Additional Screening:  Hepatitis C Screening: does qualify; Completed 06/23/20  Vision Screening: Recommended annual ophthalmology exams for early detection of glaucoma and other disorders of the eye. Is the patient up to date  with their annual eye exam?  Yes  Who is the provider or what is the name of the office in which the patient attends annual eye exams? Patient unable to recall provider.   Dental Screening: Recommended annual dental exams for proper oral hygiene  Community Resource Referral / Chronic Care Management: CRR required this visit?  No   CCM required this visit?  No      Plan:     I have personally reviewed and noted the following in the patient's chart:   Medical and social history Use of alcohol, tobacco or illicit drugs  Current medications and supplements including opioid prescriptions.  Functional ability and status Nutritional status Physical activity Advanced directives List of other physicians Hospitalizations, surgeries, and ER visits in previous 12 months Vitals Screenings to include cognitive, depression, and falls Referrals and appointments  In addition, I have reviewed and discussed with patient certain preventive protocols, quality metrics, and best practice recommendations. A written personalized care plan for preventive services as well as general preventive health recommendations were provided to patient.     Clemetine Marker, LPN   [id number]   Nurse Notes: none

## 2020-11-24 NOTE — Patient Instructions (Signed)
Rachel Lopez , Thank you for taking time to come for your Medicare Wellness Visit. I appreciate your ongoing commitment to your health goals. Please review the following plan we discussed and let me know if I can assist you in the future.   Screening recommendations/referrals: Colonoscopy: no longer required Mammogram: done 03/11/20 Bone Density: done 11/30/17.  Recommended yearly ophthalmology/optometry visit for glaucoma screening and checkup Recommended yearly dental visit for hygiene and checkup  Vaccinations: Influenza vaccine: done 12/25/19 Pneumococcal vaccine: done 10/18/16 Tdap vaccine: due Shingles vaccine: Shingrix discussed. Please contact your pharmacy for coverage information.  Covid-19: done 06/14/19, 07/10/19 & 07/06/20  Advanced directives: Advance directive discussed with you today. I have provided a copy for you to complete at home and have notarized. Once this is complete please bring a copy in to our office so we can scan it into your chart.   Conditions/risks identified: Keep up the great work!  Next appointment: Follow up in one year for your annual wellness visit    Preventive Care 65 Years and Older, Female Preventive care refers to lifestyle choices and visits with your health care provider that can promote health and wellness. What does preventive care include? A yearly physical exam. This is also called an annual well check. Dental exams once or twice a year. Routine eye exams. Ask your health care provider how often you should have your eyes checked. Personal lifestyle choices, including: Daily care of your teeth and gums. Regular physical activity. Eating a healthy diet. Avoiding tobacco and drug use. Limiting alcohol use. Practicing safe sex. Taking low-dose aspirin every day. Taking vitamin and mineral supplements as recommended by your health care provider. What happens during an annual well check? The services and screenings done by your health care  provider during your annual well check will depend on your age, overall health, lifestyle risk factors, and family history of disease. Counseling  Your health care provider may ask you questions about your: Alcohol use. Tobacco use. Drug use. Emotional well-being. Home and relationship well-being. Sexual activity. Eating habits. History of falls. Memory and ability to understand (cognition). Work and work Statistician. Reproductive health. Screening  You may have the following tests or measurements: Height, weight, and BMI. Blood pressure. Lipid and cholesterol levels. These may be checked every 5 years, or more frequently if you are over 81 years old. Skin check. Lung cancer screening. You may have this screening every year starting at age 33 if you have a 30-pack-year history of smoking and currently smoke or have quit within the past 15 years. Fecal occult blood test (FOBT) of the stool. You may have this test every year starting at age 16. Flexible sigmoidoscopy or colonoscopy. You may have a sigmoidoscopy every 5 years or a colonoscopy every 10 years starting at age 51. Hepatitis C blood test. Hepatitis B blood test. Sexually transmitted disease (STD) testing. Diabetes screening. This is done by checking your blood sugar (glucose) after you have not eaten for a while (fasting). You may have this done every 1-3 years. Bone density scan. This is done to screen for osteoporosis. You may have this done starting at age 71. Mammogram. This may be done every 1-2 years. Talk to your health care provider about how often you should have regular mammograms. Talk with your health care provider about your test results, treatment options, and if necessary, the need for more tests. Vaccines  Your health care provider may recommend certain vaccines, such as: Influenza vaccine. This is recommended  every year. Tetanus, diphtheria, and acellular pertussis (Tdap, Td) vaccine. You may need a Td  booster every 10 years. Zoster vaccine. You may need this after age 71. Pneumococcal 13-valent conjugate (PCV13) vaccine. One dose is recommended after age 60. Pneumococcal polysaccharide (PPSV23) vaccine. One dose is recommended after age 79. Talk to your health care provider about which screenings and vaccines you need and how often you need them. This information is not intended to replace advice given to you by your health care provider. Make sure you discuss any questions you have with your health care provider. Document Released: 05/02/2015 Document Revised: 12/24/2015 Document Reviewed: 02/04/2015 Elsevier Interactive Patient Education  2017 [place] Prevention in the Home Falls can cause injuries. They can happen to people of all ages. There are many things you can do to make your home safe and to help prevent falls. What can I do on the outside of my home? Regularly fix the edges of walkways and driveways and fix any cracks. Remove anything that might make you trip as you walk through a door, such as a raised step or threshold. Trim any bushes or trees on the path to your home. Use bright outdoor lighting. Clear any walking paths of anything that might make someone trip, such as rocks or tools. Regularly check to see if handrails are loose or broken. Make sure that both sides of any steps have handrails. Any raised decks and porches should have guardrails on the edges. Have any leaves, snow, or ice cleared regularly. Use sand or salt on walking paths during winter. Clean up any spills in your garage right away. This includes oil or grease spills. What can I do in the bathroom? Use night lights. Install grab bars by the toilet and in the tub and shower. Do not use towel bars as grab bars. Use non-skid mats or decals in the tub or shower. If you need to sit down in the shower, use a plastic, non-slip stool. Keep the floor dry. Clean up any water that spills on the floor  as soon as it happens. Remove soap buildup in the tub or shower regularly. Attach bath mats securely with double-sided non-slip rug tape. Do not have throw rugs and other things on the floor that can make you trip. What can I do in the bedroom? Use night lights. Make sure that you have a light by your bed that is easy to reach. Do not use any sheets or blankets that are too big for your bed. They should not hang down onto the floor. Have a firm chair that has side arms. You can use this for support while you get dressed. Do not have throw rugs and other things on the floor that can make you trip. What can I do in the kitchen? Clean up any spills right away. Avoid walking on wet floors. Keep items that you use a lot in easy-to-reach places. If you need to reach something above you, use a strong step stool that has a grab bar. Keep electrical cords out of the way. Do not use floor polish or wax that makes floors slippery. If you must use wax, use non-skid floor wax. Do not have throw rugs and other things on the floor that can make you trip. What can I do with my stairs? Do not leave any items on the stairs. Make sure that there are handrails on both sides of the stairs and use them. Fix handrails that are broken  or loose. Make sure that handrails are as long as the stairways. Check any carpeting to make sure that it is firmly attached to the stairs. Fix any carpet that is loose or worn. Avoid having throw rugs at the top or bottom of the stairs. If you do have throw rugs, attach them to the floor with carpet tape. Make sure that you have a light switch at the top of the stairs and the bottom of the stairs. If you do not have them, ask someone to add them for you. What else can I do to help prevent falls? Wear shoes that: Do not have high heels. Have rubber bottoms. Are comfortable and fit you well. Are closed at the toe. Do not wear sandals. If you use a stepladder: Make sure that it is  fully opened. Do not climb a closed stepladder. Make sure that both sides of the stepladder are locked into place. Ask someone to hold it for you, if possible. Clearly mark and make sure that you can see: Any grab bars or handrails. First and last steps. Where the edge of each step is. Use tools that help you move around (mobility aids) if they are needed. These include: Canes. Walkers. Scooters. Crutches. Turn on the lights when you go into a dark area. Replace any light bulbs as soon as they burn out. Set up your furniture so you have a clear path. Avoid moving your furniture around. If any of your floors are uneven, fix them. If there are any pets around you, be aware of where they are. Review your medicines with your doctor. Some medicines can make you feel dizzy. This can increase your chance of falling. Ask your doctor what other things that you can do to help prevent falls. This information is not intended to replace advice given to you by your health care provider. Make sure you discuss any questions you have with your health care provider. Document Released: 01/30/2009 Document Revised: 09/11/2015 Document Reviewed: 05/10/2014 Elsevier Interactive Patient Education  2017 [employer].

## 2020-12-08 IMAGING — MG DIGITAL SCREENING BILAT W/ TOMO W/ CAD
8 series · 8 of 24 positions shown · non-contrast
Comparison: Previous exam(s).

CLINICAL DATA: Screening.

EXAM:
DIGITAL SCREENING BILATERAL MAMMOGRAM WITH TOMO AND CAD

[L CC synth-2D]
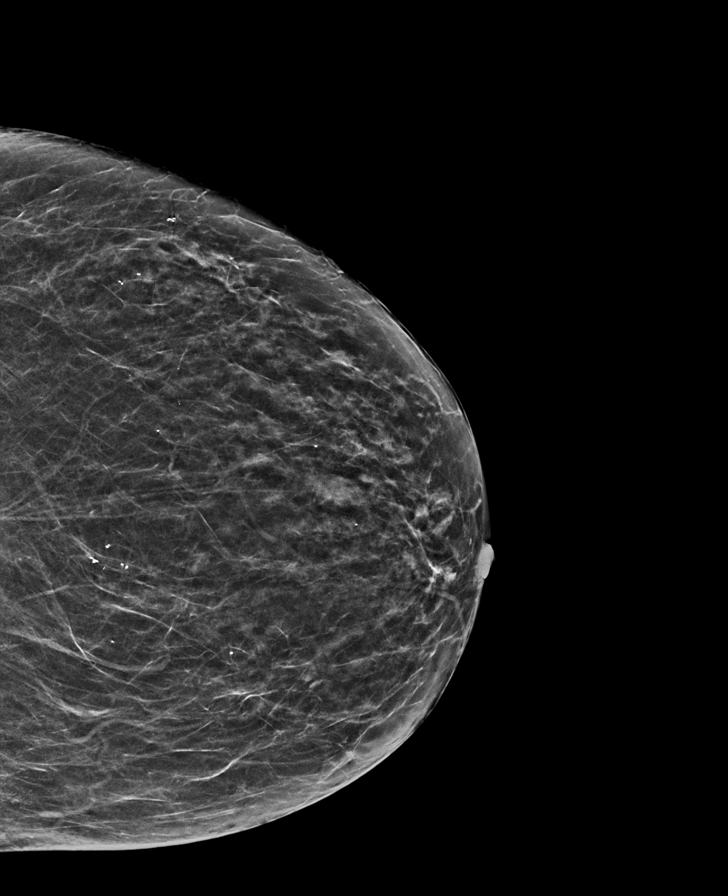

[R MLO synth-2D]
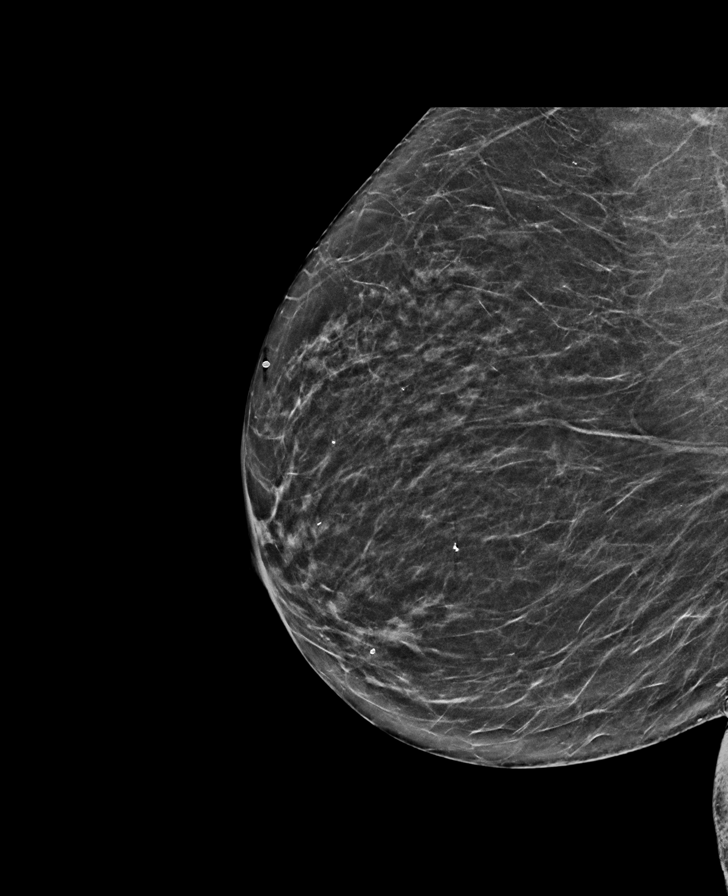

[R CC synth-2D]
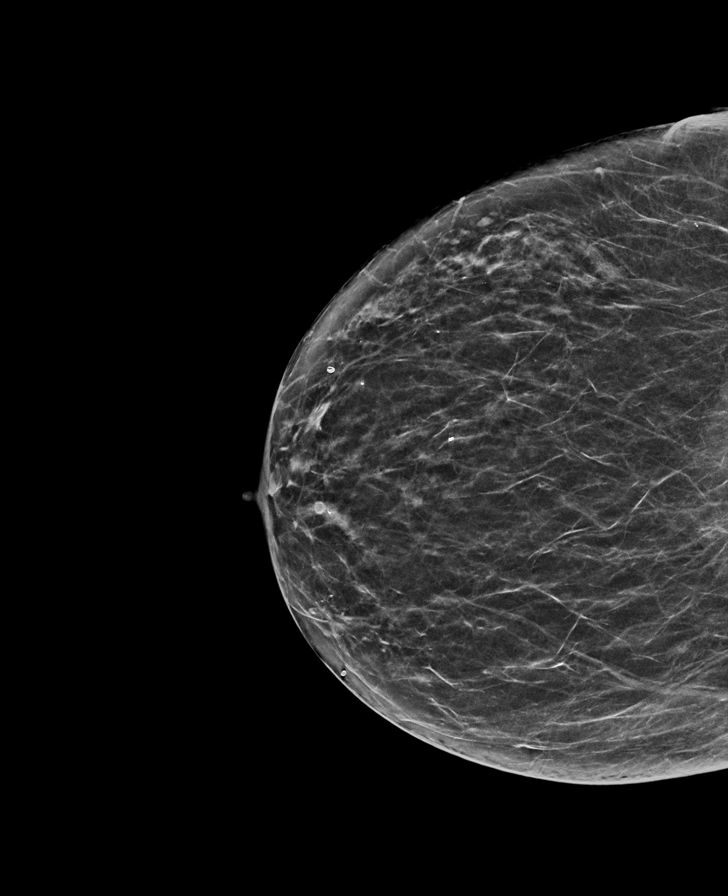

[L MLO synth-2D]
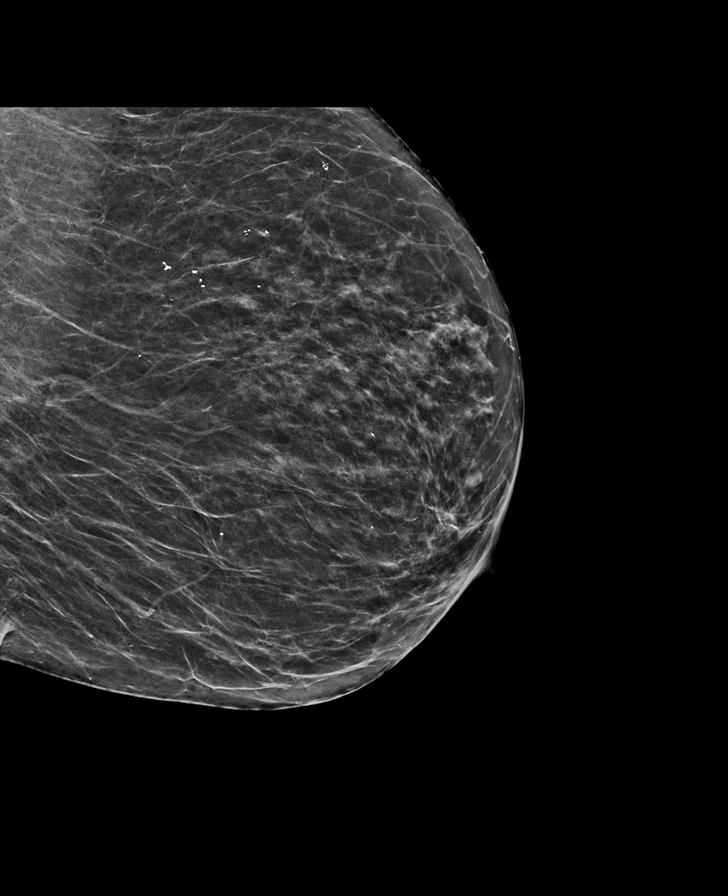

[R CC tomo · tomo slice 29/56.0]
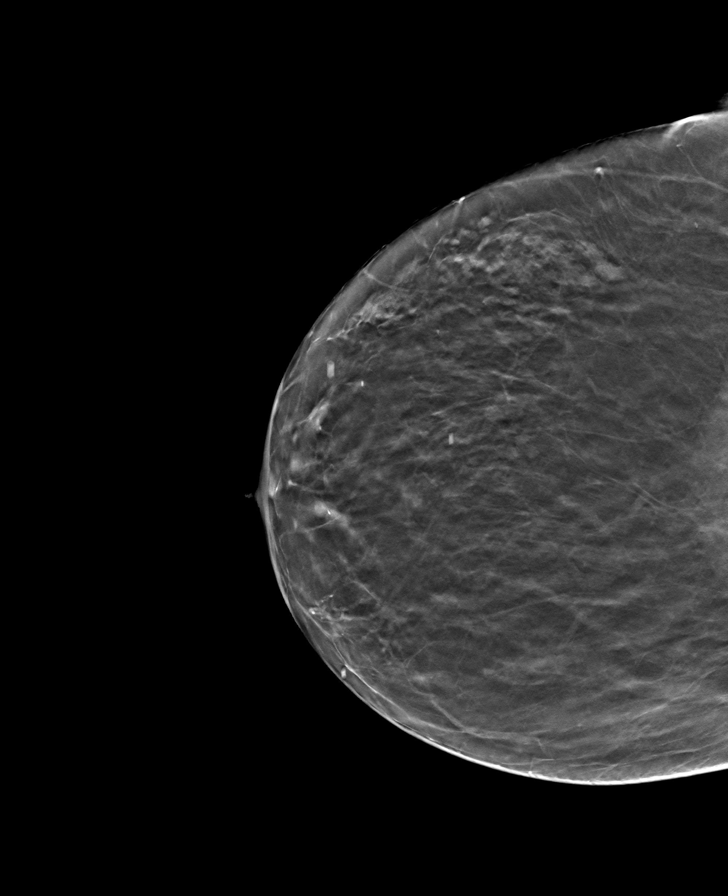

[L MLO tomo · tomo slice 29/56.0]
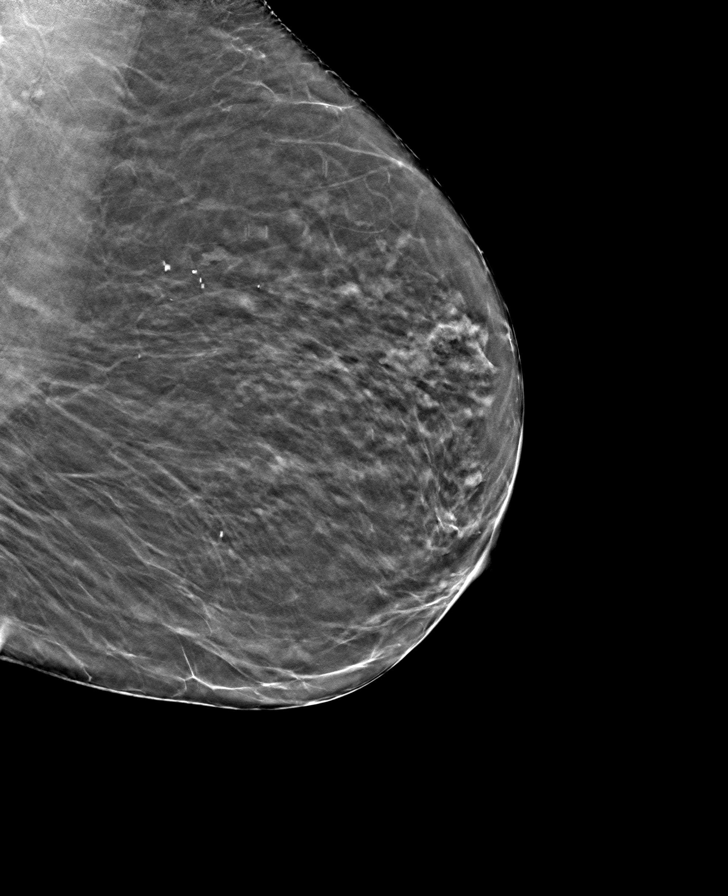

[L CC tomo · tomo slice 28/55.0]
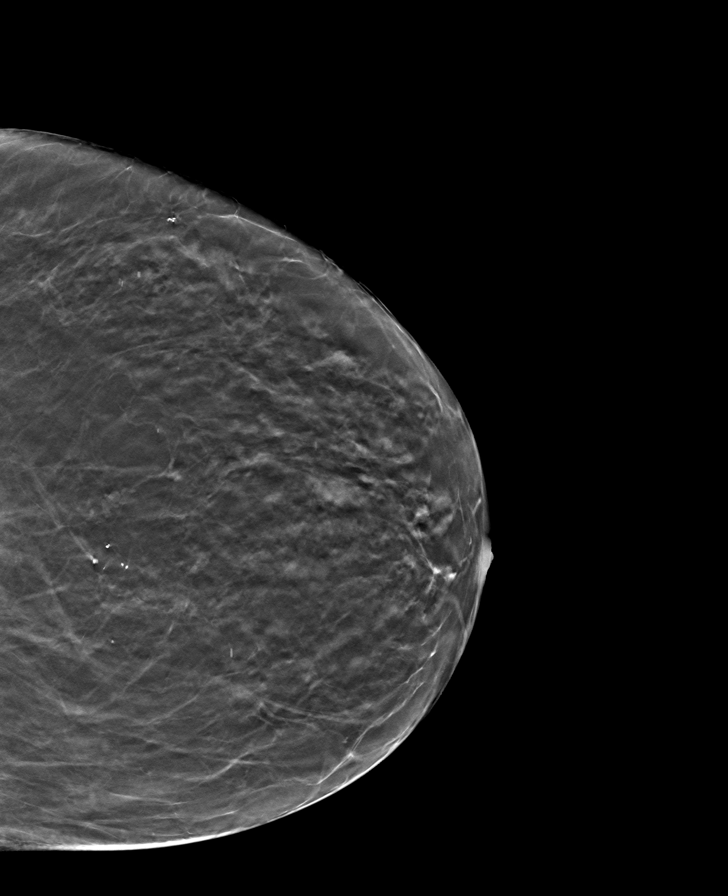

[R MLO tomo · tomo slice 27/53.0]
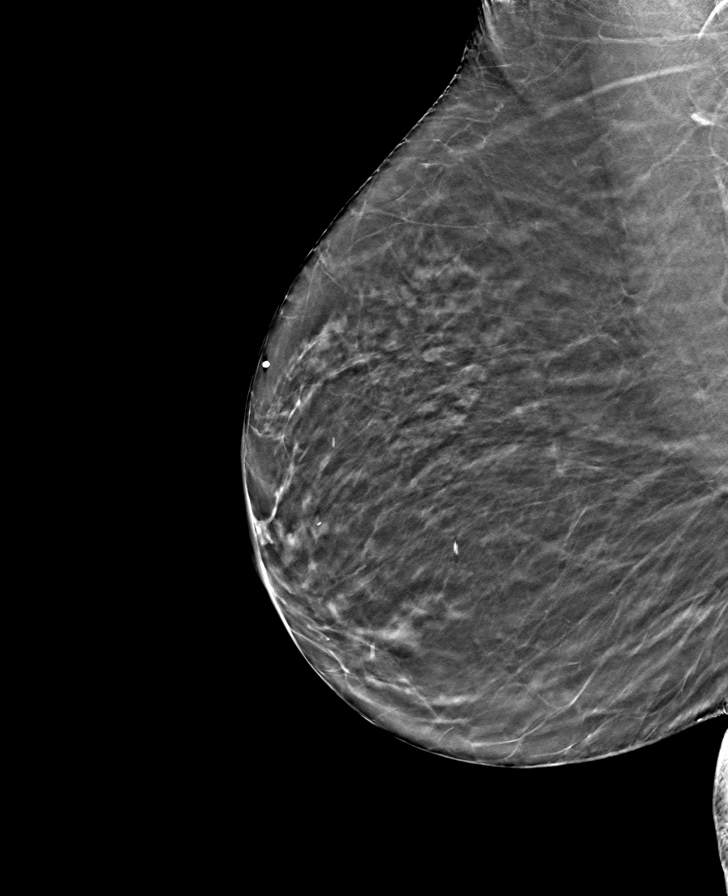

[8 of 24 positions shown; findings below may reference images not displayed]

ACR Breast Density Category b: There are scattered areas of
fibroglandular density.
FINDINGS: There are no findings suspicious for malignancy. Images were
processed with CAD.
IMPRESSION: No mammographic evidence of malignancy. A result letter of this
screening mammogram will be mailed directly to the patient.

RECOMMENDATION:
Screening mammogram in one year. (Code:[id number])

BI-RADS CATEGORY  1: Negative.

## 2020-12-25 ENCOUNTER — Telehealth: Payer: Self-pay

## 2020-12-25 ENCOUNTER — Other Ambulatory Visit: Payer: Self-pay

## 2020-12-25 ENCOUNTER — Ambulatory Visit (INDEPENDENT_AMBULATORY_CARE_PROVIDER_SITE_OTHER): Payer: Medicare Other | Admitting: Internal Medicine

## 2020-12-25 ENCOUNTER — Encounter: Payer: Self-pay | Admitting: Internal Medicine

## 2020-12-25 VITALS — BP 112/76 | HR 84 | Temp 97.6°F | Ht 64.0 in | Wt 123.0 lb

## 2020-12-25 DIAGNOSIS — Z1231 Encounter for screening mammogram for malignant neoplasm of breast: Secondary | ICD-10-CM | POA: Diagnosis not present

## 2020-12-25 DIAGNOSIS — Z23 Encounter for immunization: Secondary | ICD-10-CM

## 2020-12-25 DIAGNOSIS — N183 Chronic kidney disease, stage 3 unspecified: Secondary | ICD-10-CM | POA: Diagnosis not present

## 2020-12-25 DIAGNOSIS — I10 Essential (primary) hypertension: Secondary | ICD-10-CM

## 2020-12-25 DIAGNOSIS — E890 Postprocedural hypothyroidism: Secondary | ICD-10-CM | POA: Diagnosis not present

## 2020-12-25 DIAGNOSIS — E782 Mixed hyperlipidemia: Secondary | ICD-10-CM

## 2020-12-25 MED ORDER — LEVOTHYROXINE SODIUM 75 MCG PO TABS: 75.0000 ug | ORAL_TABLET | Freq: Every day | ORAL | 3 refills | Status: DC

## 2020-12-25 MED ORDER — LISINOPRIL 40 MG PO TABS: 40.0000 mg | ORAL_TABLET | Freq: Every day | ORAL | 3 refills | Status: DC

## 2020-12-25 NOTE — Telephone Encounter (Signed)
Patient walked back into the clinic after her blood drawn at [facility] from today's appt with her son Rachel Lopez who was concerned about patients atorvastatin causing patient cognitive issues.  While speak with patient and son, informed them that per patient at today's visit she is no longer taking atorvastatin. Son Rachel Lopez looked at patient and said he was not aware that she stopped this medication on her own already. Patient said she has been off this medication for several months. Explained to son Rachel Lopez that this would mean the medication should be out of her system at this point and if she was still having cognitive issues then it would not be from the medication.  Explained we will call the patient once her labs return, if her lipids come back concerned told son we will let them know next steps for cholesterol. Otherwise, patient needs to work on a healthy diet, and remain off medication at this time.   FYI.

## 2020-12-25 NOTE — Progress Notes (Signed)
Date:  12/25/2020   Name:  Rachel Lopez   DOB:  03/25/1941   MRN:  [id number]   Chief Complaint: Hypertension and Flu Vaccine  Hypertension This is a chronic problem. The problem is controlled. Pertinent negatives include no chest pain, headaches, palpitations or shortness of breath. Past treatments include ACE inhibitors. Hypertensive end-organ damage includes kidney disease. There is no history of CAD/MI or CVA. Identifiable causes of hypertension include a thyroid problem.  Thyroid Problem Presents for follow-up visit. Patient reports no anxiety, constipation, diarrhea, fatigue or palpitations. The symptoms have been stable.  She is no longer taking Atorvastatin due to dizziness.  Lab Results  Component Value Date   CREATININE 1.33 (H) 06/23/2020   BUN 14 06/23/2020   NA 137 06/23/2020   K 4.9 06/23/2020   CL 100 06/23/2020   CO2 22 06/23/2020   Lab Results  Component Value Date   CHOL 210 (H) 06/23/2020   HDL 57 06/23/2020   LDLCALC 131 (H) 06/23/2020   TRIG 121 06/23/2020   CHOLHDL 3.7 06/23/2020   Lab Results  Component Value Date   TSH 2.540 06/23/2020   No results found for: HGBA1C Lab Results  Component Value Date   WBC 8.0 06/23/2020   HGB 14.2 06/23/2020   HCT 43.6 06/23/2020   MCV 90 06/23/2020   PLT 310 06/23/2020   Lab Results  Component Value Date   ALT 12 06/23/2020   AST 16 06/23/2020   ALKPHOS 120 06/23/2020   BILITOT 0.2 06/23/2020     Review of Systems  Constitutional:  Negative for fatigue and unexpected weight change.  HENT:  Negative for nosebleeds and trouble swallowing.   Eyes:  Negative for visual disturbance.  Respiratory:  Negative for cough, chest tightness, shortness of breath and wheezing.   Cardiovascular:  Negative for chest pain, palpitations and leg swelling.  Gastrointestinal:  Negative for abdominal pain, constipation and diarrhea.  Musculoskeletal:  Negative for arthralgias and gait problem.  Neurological:   Negative for dizziness (resolved after stopping lipitor), weakness, light-headedness and headaches.  Psychiatric/Behavioral:  Negative for dysphoric mood and sleep disturbance. The patient is not nervous/anxious.    Patient Active Problem List   Diagnosis Date Noted   Arthritis of knee, right 11/03/2017   Chronic renal insufficiency, stage 3 (moderate) ([place]) 04/21/2016   Angiomyolipoma of kidney 01/01/2015   HLD (hyperlipidemia) 10/18/2014   Benign hypertension 10/18/2014   Hypothyroidism, postablative 10/18/2014   Intraductal papilloma of left breast 10/18/2014    No Known Allergies  Past Surgical History:  Procedure Laterality Date   ABDOMINAL HYSTERECTOMY     APPENDECTOMY     70yr  BREAST BIOPSY Left 10/15/2014   papilloma -u/s bx /clip   BREAST EXCISIONAL BIOPSY Left 04/2015   papilloma removed-neg   HEMORRHOID SURGERY  2014   LAPAROSCOPIC NEPHRECTOMY, HAND ASSISTED Right 04/16/2015   Procedure: HAND ASSISTED LAPAROSCOPIC NEPHRECTOMY;  Surgeon: BNickie Retort MD;  Location: [facility] ORS;  Service: Urology;  Laterality: Right;   RADICAL HYSTERECTOMY  1969   TOTAL THYROIDECTOMY     age 80   Social History   Tobacco Use   Smoking status: Former    Packs/day: 0.50    Years: 15.00    Pack years: 7.50    Types: Cigarettes    Quit date: 02/18/1979    Years since quitting: 41.8   Smokeless tobacco: Never   Tobacco comments:    smoking cessation materials not required  Vaping  Use   Vaping Use: Never used  Substance Use Topics   Alcohol use: No   Drug use: No     Medication list has been reviewed and updated.  Current Meds  Medication Sig   ACETAMINOPHEN PO Take by mouth as needed. Tylenol   levothyroxine (SYNTHROID) 75 MCG tablet Take 1 tablet (75 mcg total) by mouth daily.   lisinopril (ZESTRIL) 40 MG tablet Take 1 tablet (40 mg total) by mouth daily.    PHQ 2/9 Scores 12/25/2020 11/24/2020 06/23/2020 11/12/2019  PHQ - 2 Score 0 0 0 0  PHQ- 9 Score 0 - 0 -     GAD 7 : Generalized Anxiety Score 12/25/2020 06/23/2020  Nervous, Anxious, on Edge 0 0  Control/stop worrying 0 0  Worry too much - different things 0 0  Trouble relaxing 0 0  Restless 0 0  Easily annoyed or irritable 0 0  Afraid - awful might happen 0 0  Total GAD 7 Score 0 0  Anxiety Difficulty - Not difficult at all    BP Readings from Last 3 Encounters:  12/25/20 112/76  06/23/20 132/76  12/25/19 104/64    Physical Exam Vitals and nursing note reviewed.  Constitutional:      General: She is not in acute distress.    Appearance: She is well-developed.  HENT:     Head: Normocephalic and atraumatic.  Neck:     Vascular: No carotid bruit.  Cardiovascular:     Rate and Rhythm: Normal rate and regular rhythm.     Pulses: Normal pulses.     Heart sounds: No murmur heard. Pulmonary:     Effort: Pulmonary effort is normal. No respiratory distress.     Breath sounds: No wheezing or rhonchi.  Musculoskeletal:     Cervical back: Normal range of motion.     Right lower leg: No edema.     Left lower leg: No edema.  Lymphadenopathy:     Cervical: No cervical adenopathy.  Skin:    General: Skin is warm and dry.     Findings: No rash.  Neurological:     General: No focal deficit present.     Mental Status: She is alert and oriented to person, place, and time.  Psychiatric:        Attention and Perception: Attention and perception normal.        Mood and Affect: Mood normal.        Speech: Speech normal.        Behavior: Behavior normal.        Cognition and Memory: Cognition normal.        Judgment: Judgment normal.    Wt Readings from Last 3 Encounters:  12/25/20 123 lb (55.8 kg)  06/23/20 126 lb (57.2 kg)  12/25/19 131 lb (59.4 kg)    BP 112/76   Pulse 84   Temp 97.6 F (36.4 C) (Oral)   Ht '5\' 4"'$  (1.626 m)   Wt 123 lb (55.8 kg)   SpO2 99%   BMI 21.11 kg/m   Assessment and Plan: 1. Benign hypertension Clinically stable exam with well controlled  BP. Tolerating medications without side effects at this time. Pt to continue current regimen and low sodium diet; benefits of regular exercise as able discussed. - TSH + free T4 - lisinopril (ZESTRIL) 40 MG tablet; Take 1 tablet (40 mg total) by mouth daily.  Dispense: 90 tablet; Refill: 3  2. Hypothyroidism, postablative supplemented - levothyroxine (SYNTHROID) 75 MCG tablet;  Take 1 tablet (75 mcg total) by mouth daily.  Dispense: 90 tablet; Refill: 3  3. Chronic renal insufficiency, stage 3 (moderate) ([facility]) Continue to monitor bi-annually Reminded pt to avoid all NSAIDS - Basic metabolic panel  4. Mixed hyperlipidemia Now off of Lipitor due to side effects Continue healthy low fat diet Recheck at next annual exam fasting  5. Encounter for screening mammogram for breast cancer Schedule at [facility] - MM 3D [place]  Patient returned with her son after going to the lab.  Son expressed concerns that lipitor may be causing some cognitive issues.  He was not aware that she was no longer taking it.  Advised to monitor.  Patient should return sooner than planned if sx worsen.  Partially dictated using Editor, commissioning. Any errors are unintentional.  Halina Maidens, MD [facility]  12/25/2020

## 2020-12-26 LAB — TSH+FREE T4
Free T4: 1.87 ng/dL — ABNORMAL HIGH (ref 0.82–1.77)
TSH: 3.49 u[IU]/mL (ref 0.450–4.500)

## 2020-12-26 LAB — BASIC METABOLIC PANEL
BUN/Creatinine Ratio: 10 — ABNORMAL LOW (ref 12–28)
BUN: 13 mg/dL (ref 8–27)
CO2: 22 mmol/L (ref 20–29)
Calcium: 9 mg/dL (ref 8.7–10.3)
Chloride: 101 mmol/L (ref 96–106)
Creatinine, Ser: 1.28 mg/dL — ABNORMAL HIGH (ref 0.57–1.00)
Glucose: 56 mg/dL — ABNORMAL LOW (ref 65–99)
Potassium: 4.8 mmol/L (ref 3.5–5.2)
Sodium: 137 mmol/L (ref 134–144)
eGFR: 43 mL/min/{1.73_m2} — ABNORMAL LOW (ref >59)

## 2021-01-03 ENCOUNTER — Other Ambulatory Visit: Payer: Self-pay | Admitting: Internal Medicine

## 2021-01-03 DIAGNOSIS — E782 Mixed hyperlipidemia: Secondary | ICD-10-CM

## 2021-01-03 NOTE — Telephone Encounter (Signed)
dc'd 12/25/20 Pt preference: Waynetta Pean CMA

## 2021-06-24 ENCOUNTER — Encounter: Payer: Self-pay | Admitting: Internal Medicine

## 2021-06-24 ENCOUNTER — Ambulatory Visit (INDEPENDENT_AMBULATORY_CARE_PROVIDER_SITE_OTHER): Payer: Medicare Other | Admitting: Internal Medicine

## 2021-06-24 ENCOUNTER — Other Ambulatory Visit: Payer: Self-pay

## 2021-06-24 VITALS — BP 118/70 | HR 74 | Ht 64.0 in | Wt 116.8 lb

## 2021-06-24 DIAGNOSIS — M1711 Unilateral primary osteoarthritis, right knee: Secondary | ICD-10-CM | POA: Diagnosis not present

## 2021-06-24 DIAGNOSIS — G3184 Mild cognitive impairment, so stated: Secondary | ICD-10-CM | POA: Diagnosis not present

## 2021-06-24 DIAGNOSIS — D242 Benign neoplasm of left breast: Secondary | ICD-10-CM | POA: Diagnosis not present

## 2021-06-24 DIAGNOSIS — E890 Postprocedural hypothyroidism: Secondary | ICD-10-CM

## 2021-06-24 DIAGNOSIS — N183 Chronic kidney disease, stage 3 unspecified: Secondary | ICD-10-CM | POA: Diagnosis not present

## 2021-06-24 DIAGNOSIS — Z0000 Encounter for general adult medical examination without abnormal findings: Secondary | ICD-10-CM | POA: Diagnosis not present

## 2021-06-24 DIAGNOSIS — E782 Mixed hyperlipidemia: Secondary | ICD-10-CM

## 2021-06-24 DIAGNOSIS — I10 Essential (primary) hypertension: Secondary | ICD-10-CM

## 2021-06-24 NOTE — Progress Notes (Signed)
Date:  06/24/2021   Name:  Rachel Lopez   DOB:  1940-12-01   MRN:  [id number]   Chief Complaint: Annual Exam Rachel Lopez is a 81 y.o. female who presents today for her Complete Annual Exam. She feels well. She reports exercising - none. She reports she is sleeping well. Breast complaints - none.  Mammogram: 02/2020 DEXA: 11/2017 normal spine/osteopenia hip Pap smear: none Colonoscopy: 2013 aged out  Immunization History  Administered Date(s) Administered   Fluad Quad(high Dose 65+) 12/25/2019, 12/25/2020   Influenza, High Dose Seasonal PF 01/21/2017, 01/15/2019   Influenza-Unspecified 01/27/2015, 02/15/2018   Moderna SARS-COV2 Booster Vaccination 07/06/2020   PFIZER(Purple Top)SARS-COV-2 Vaccination 06/14/2019, 07/10/2019   Pneumococcal Conjugate-13 09/10/2014   Pneumococcal Polysaccharide-23 10/18/2016    Hypertension This is a chronic problem. The problem is controlled. Pertinent negatives include no chest pain, headaches, palpitations or shortness of breath. Past treatments include ACE inhibitors (has not been taking lisinopril for unclear reasons). Hypertensive end-organ damage includes kidney disease. There is no history of CAD/MI or CVA. Identifiable causes of hypertension include a thyroid problem.  Thyroid Problem Presents for follow-up visit. Symptoms include weight loss (has lost a few pounds - not eating quite as much). Patient reports no anxiety, constipation, diarrhea, fatigue, palpitations or tremors. The symptoms have been stable.   Lab Results  Component Value Date   NA 137 12/25/2020   K 4.8 12/25/2020   CO2 22 12/25/2020   GLUCOSE 56 (L) 12/25/2020   BUN 13 12/25/2020   CREATININE 1.28 (H) 12/25/2020   CALCIUM 9.0 12/25/2020   EGFR 43 (L) 12/25/2020   GFRNONAA 36 (L) 12/25/2019   Lab Results  Component Value Date   CHOL 210 (H) 06/23/2020   HDL 57 06/23/2020   LDLCALC 131 (H) 06/23/2020   TRIG 121 06/23/2020   CHOLHDL 3.7 06/23/2020   Lab  Results  Component Value Date   TSH 3.490 12/25/2020   No results found for: HGBA1C Lab Results  Component Value Date   WBC 8.0 06/23/2020   HGB 14.2 06/23/2020   HCT 43.6 06/23/2020   MCV 90 06/23/2020   PLT 310 06/23/2020   Lab Results  Component Value Date   ALT 12 06/23/2020   AST 16 06/23/2020   ALKPHOS 120 06/23/2020   BILITOT 0.2 06/23/2020   No results found for: 25OHVITD2, 25OHVITD3, VD25OH   Review of Systems  Constitutional:  Positive for weight loss (has lost a few pounds - not eating quite as much). Negative for chills, fatigue and fever.  HENT:  Negative for congestion, hearing loss, tinnitus, trouble swallowing and voice change.   Eyes:  Negative for visual disturbance.  Respiratory:  Negative for cough, chest tightness, shortness of breath and wheezing.   Cardiovascular:  Negative for chest pain, palpitations and leg swelling.  Gastrointestinal:  Negative for abdominal pain, constipation, diarrhea and vomiting.  Endocrine: Negative for polydipsia and polyuria.  Genitourinary:  Negative for dysuria, frequency, genital sores, vaginal bleeding and vaginal discharge.  Musculoskeletal:  Positive for arthralgias (both knees R>L). Negative for gait problem and joint swelling.  Skin:  Negative for color change and rash.  Neurological:  Negative for dizziness, tremors, light-headedness and headaches.  Hematological:  Negative for adenopathy. Does not bruise/bleed easily.  Psychiatric/Behavioral:  Positive for confusion (mild memory issues -). Negative for dysphoric mood and sleep disturbance. The patient is not nervous/anxious.    Patient Active Problem List   Diagnosis Date Noted   Arthritis of  knee, right 11/03/2017   Chronic renal insufficiency, stage 3 (moderate) ([place]) 04/21/2016   Angiomyolipoma of kidney 01/01/2015   HLD (hyperlipidemia) 10/18/2014   Benign hypertension 10/18/2014   Hypothyroidism, postablative 10/18/2014   Intraductal papilloma of left  breast 10/18/2014    Allergies  Allergen Reactions   Atorvastatin Other (See Comments)    Dizziness    Past Surgical History:  Procedure Laterality Date   ABDOMINAL HYSTERECTOMY     APPENDECTOMY     62yr  BREAST BIOPSY Left 10/15/2014   papilloma -u/s bx /clip   BREAST EXCISIONAL BIOPSY Left 04/2015   papilloma removed-neg   HEMORRHOID SURGERY  2014   LAPAROSCOPIC NEPHRECTOMY, HAND ASSISTED Right 04/16/2015   Procedure: HAND ASSISTED LAPAROSCOPIC NEPHRECTOMY;  Surgeon: BNickie Retort MD;  Location: [facility] ORS;  Service: Urology;  Laterality: Right;   RADICAL HYSTERECTOMY  1969   TOTAL THYROIDECTOMY     age 81   Social History   Tobacco Use   Smoking status: Former    Packs/day: 0.50    Years: 15.00    Pack years: 7.50    Types: Cigarettes    Quit date: 02/18/1979    Years since quitting: 42.3   Smokeless tobacco: Never   Tobacco comments:    smoking cessation materials not required  Vaping Use   Vaping Use: Never used  Substance Use Topics   Alcohol use: No   Drug use: No     Medication list has been reviewed and updated.  Current Meds  Medication Sig   ACETAMINOPHEN PO Take by mouth as needed. Tylenol   lisinopril (ZESTRIL) 40 MG tablet Take 1 tablet (40 mg total) by mouth daily.    PHQ 2/9 Scores 06/24/2021 12/25/2020 11/24/2020 06/23/2020  PHQ - 2 Score 4 0 0 0  PHQ- 9 Score 5 0 - 0    GAD 7 : Generalized Anxiety Score 06/24/2021 12/25/2020 06/23/2020  Nervous, Anxious, on Edge 0 0 0  Control/stop worrying 0 0 0  Worry too much - different things 0 0 0  Trouble relaxing 0 0 0  Restless 0 0 0  Easily annoyed or irritable 0 0 0  Afraid - awful might happen 0 0 0  Total GAD 7 Score 0 0 0  Anxiety Difficulty Not difficult at all - Not difficult at all    BP Readings from Last 3 Encounters:  06/24/21 118/70  12/25/20 112/76  06/23/20 132/76    Physical Exam Vitals and nursing note reviewed.  Constitutional:      General: She is not in acute  distress.    Appearance: She is well-developed.  HENT:     Head: Normocephalic and atraumatic.     Right Ear: Tympanic membrane and ear canal normal.     Left Ear: Tympanic membrane and ear canal normal.     Nose:     Right Sinus: No maxillary sinus tenderness.     Left Sinus: No maxillary sinus tenderness.  Eyes:     General: No scleral icterus.       Right eye: No discharge.        Left eye: No discharge.     Conjunctiva/sclera: Conjunctivae normal.  Neck:     Thyroid: No thyromegaly.     Vascular: No carotid bruit.  Cardiovascular:     Rate and Rhythm: Normal rate and regular rhythm.     Pulses: Normal pulses.     Heart sounds: Normal heart sounds.  Pulmonary:  Effort: Pulmonary effort is normal. No respiratory distress.     Breath sounds: No wheezing.  Chest:  Breasts:    Right: No mass, nipple discharge, skin change or tenderness.     Left: No mass, nipple discharge, skin change or tenderness.  Abdominal:     General: Bowel sounds are normal.     Palpations: Abdomen is soft.     Tenderness: There is no abdominal tenderness.  Musculoskeletal:        General: No swelling or tenderness.     Cervical back: Normal range of motion. No erythema.     Right lower leg: No edema.     Left lower leg: No edema.  Lymphadenopathy:     Cervical: No cervical adenopathy.  Skin:    General: Skin is warm and dry.     Capillary Refill: Capillary refill takes less than 2 seconds.     Findings: No rash.  Neurological:     General: No focal deficit present.     Mental Status: She is alert and oriented to person, place, and time.     Cranial Nerves: No cranial nerve deficit.     Sensory: No sensory deficit.     Deep Tendon Reflexes: Reflexes are normal and symmetric.  Psychiatric:        Attention and Perception: Attention normal.        Mood and Affect: Mood normal.    Wt Readings from Last 3 Encounters:  06/24/21 116 lb 12.8 oz (53 kg)  12/25/20 123 lb (55.8 kg)  06/23/20  126 lb (57.2 kg)    BP 118/70    Pulse 74    Ht 5' 4" (1.626 m)    Wt 116 lb 12.8 oz (53 kg)    SpO2 96%    BMI 20.05 kg/m   Assessment and Plan: 1. Annual physical exam Normal exam except for weight loss. Recommend supplementing with Ensure or boost to help maintain nutrition  2. Benign hypertension Clinically stable exam with well controlled BP despite being off of medications May be better controlled since weight loss. Will continue to monitor. - CBC with Differential/Platelet - POCT urinalysis dipstick  3. Hypothyroidism, postablative Supplemented - adjust dose if needed - TSH + free T4  4. Chronic renal insufficiency, stage 3 (moderate) (HCC) Continue to avoid NSAIDS - Comprehensive metabolic panel  5. Mixed hyperlipidemia Check labs.  Intolerant of atorvastatin. - Lipid panel  6. Intraductal papilloma of left breast Recommend that she continue annual mammograms  7. Arthritis of knee, right Continue knee braces. Consider SM consult for steroid or visco-supplementation  8. MCI (mild cognitive impairment) Son Marlou Sa lives next door. Patient wants to remain independent.  She is currently driving but only short distances. She is managing her household finances with support. Family will need to keep close watch and monitor for worsening or problematic behavior.   Partially dictated using Editor, commissioning. Any errors are unintentional.  Halina Maidens, MD [facility]  06/24/2021

## 2021-06-24 NOTE — Patient Instructions (Signed)
Ensure or Boost  nutritional supplements to take to avoid further weight loss ? ?Consider seeing Sport Medicine for your knee pain - you might be a great candidate for joint injections. ? ?Schedule Mammogram ?

## 2021-06-25 LAB — COMPREHENSIVE METABOLIC PANEL
ALT: 7 IU/L (ref 0–32)
AST: 12 IU/L (ref 0–40)
Albumin/Globulin Ratio: 1.7 (ref 1.2–2.2)
Albumin: 4 g/dL (ref 3.7–4.7)
Alkaline Phosphatase: 99 IU/L (ref 44–121)
BUN/Creatinine Ratio: 13 (ref 12–28)
BUN: 16 mg/dL (ref 8–27)
Bilirubin Total: 0.3 mg/dL (ref 0.0–1.2)
CO2: 18 mmol/L — ABNORMAL LOW (ref 20–29)
Calcium: 9.1 mg/dL (ref 8.7–10.3)
Chloride: 99 mmol/L (ref 96–106)
Creatinine, Ser: 1.25 mg/dL — ABNORMAL HIGH (ref 0.57–1.00)
Globulin, Total: 2.4 g/dL (ref 1.5–4.5)
Glucose: 80 mg/dL (ref 70–99)
Potassium: 4.4 mmol/L (ref 3.5–5.2)
Sodium: 137 mmol/L (ref 134–144)
Total Protein: 6.4 g/dL (ref 6.0–8.5)
eGFR: 44 mL/min/{1.73_m2} — ABNORMAL LOW (ref >59)

## 2021-06-25 LAB — CBC WITH DIFFERENTIAL/PLATELET
Basophils Absolute: 0.1 10*3/uL (ref 0.0–0.2)
Basos: 1 %
EOS (ABSOLUTE): 0.1 10*3/uL (ref 0.0–0.4)
Eos: 2 %
Hematocrit: 39.8 % (ref 34.0–46.6)
Hemoglobin: 13.2 g/dL (ref 11.1–15.9)
Immature Grans (Abs): 0 10*3/uL (ref 0.0–0.1)
Immature Granulocytes: 0 %
Lymphocytes Absolute: 0.6 10*3/uL — ABNORMAL LOW (ref 0.7–3.1)
Lymphs: 7 %
MCH: 28.1 pg (ref 26.6–33.0)
MCHC: 33.2 g/dL (ref 31.5–35.7)
MCV: 85 fL (ref 79–97)
Monocytes Absolute: 0.8 10*3/uL (ref 0.1–0.9)
Monocytes: 9 %
Neutrophils Absolute: 6.8 10*3/uL (ref 1.4–7.0)
Neutrophils: 81 %
Platelets: 395 10*3/uL (ref 150–450)
RBC: 4.7 x10E6/uL (ref 3.77–5.28)
RDW: 12.4 % (ref 11.7–15.4)
WBC: 8.3 10*3/uL (ref 3.4–10.8)

## 2021-06-25 LAB — LIPID PANEL
Chol/HDL Ratio: 4.2 ratio (ref 0.0–4.4)
Cholesterol, Total: 252 mg/dL — ABNORMAL HIGH (ref 100–199)
HDL: 60 mg/dL (ref >39)
LDL Chol Calc (NIH): 173 mg/dL — ABNORMAL HIGH (ref 0–99)
Triglycerides: 108 mg/dL (ref 0–149)
VLDL Cholesterol Cal: 19 mg/dL (ref 5–40)

## 2021-06-25 LAB — TSH+FREE T4
Free T4: 1.85 ng/dL — ABNORMAL HIGH (ref 0.82–1.77)
TSH: 5.43 u[IU]/mL — ABNORMAL HIGH (ref 0.450–4.500)

## 2021-08-24 ENCOUNTER — Ambulatory Visit: Payer: Self-pay | Admitting: *Deleted

## 2021-08-24 NOTE — Telephone Encounter (Signed)
?  Chief Complaint: pharmacy reports patient is requesting medication lisinopril multiple times for 90 day supply and confused regarding refills per Amy at [place]  ?Symptoms: running out of medication before time to refill  ?Frequency: x 2-3 months  ?Pertinent Negatives: Patient denies confusion or misplaced medications  ?Disposition: '[]'$ ED /'[]'$ Urgent Care (no appt availability in office) / '[]'$ Appointment(In office/virtual)/ '[]'$  [facility]/ '[]'$ Home Care/ '[]'$ Refused Recommended Disposition /'[]'$ [facility]/ '[x]'$  Follow-up with PCP ?Additional Notes:  ? ?Please advise and call patient today regarding medications.  ?Called patient to review medication requests. Patient able to answer questions without confusion. Patient reports she does not have any lisinopril left. Pharmacy reports giving 90 day supply on 02/05/22 , 04/14/21, 06/27/21 , 07/23/21 and paid out of pocket and used coupon to get more medication. Please advise today . [place] notified of situation regarding refills.  ? ? ? ? Reason for Disposition ? RN needs further essential information from caller in order to complete triage ? ?Answer Assessment - Initial Assessment Questions ?1. REASON FOR CALL or QUESTION: "What is your reason for calling today?" or "How can I best help you?" or "What question do you have that I can help answer?" ?    Amy from [place] called to report multiple requests for lisinopril from patient  and noted possible confusion regarding medication refills. Please advise ? ?Answer Assessment - Initial Assessment Questions ?1. LEVEL OF CONSCIOUSNESS: "How is he (she, the patient) acting right now?" (e.g., alert-oriented, confused, lethargic, stuporous, comatose) ?    Alert and oriented able to verbally give address ?2. ONSET: "When did the confusion start?"  (minutes, hours, days) ?    na ?3. PATTERN "Does this come and go, or has it been constant since it started?"  "Is it present now?" ?    na ?4.  ALCOHOL or DRUGS: "Has he been drinking alcohol or taking any drugs?"  ?    na ?5. NARCOTIC MEDICATIONS: "Has he been receiving any narcotic medications?" (e.g., morphine, Vicodin) ?    na ?6. CAUSE: "What do you think is causing the confusion?"  ?    Patient requesting and getting multiple refills ( 90 day supply) of lisinopril from pharmacy and reports she is out of medication. ?7. OTHER SYMPTOMS: "Are there any other symptoms?" (e.g., difficulty breathing, headache, fever, weakness) ?    None ? ?Protocols used: Information Only Call - No Triage-A-AH, Confusion - Delirium-A-AH ? ?

## 2021-08-24 NOTE — Telephone Encounter (Signed)
Called pt son left VM for him to call back. ? ?[place] nurse may give results to patient if they return call to clinic, a CRM has been created. ? ?KP

## 2021-08-25 ENCOUNTER — Other Ambulatory Visit: Payer: Self-pay | Admitting: Internal Medicine

## 2021-08-25 ENCOUNTER — Telehealth: Payer: Self-pay

## 2021-08-25 DIAGNOSIS — E890 Postprocedural hypothyroidism: Secondary | ICD-10-CM

## 2021-08-25 DIAGNOSIS — I10 Essential (primary) hypertension: Secondary | ICD-10-CM

## 2021-08-25 NOTE — Telephone Encounter (Signed)
Pt's son returned call. States he is unsure if pt is taking meds correctly as ordered. States on 02/07/22 he did go with pt to pharmacy when she claimed to be out of med. SHe went without him last week. Son states pt is generally more confused, especially at night and has lost weight. Assured NT would route to practice for PCPs review. CB# (Cell) [phone] ?Please advise  Thank you ?

## 2021-08-25 NOTE — Telephone Encounter (Signed)
Spoke to [employer] told him that the patient is refilling her medication more than she should. Told him that he could get her a pill pack and separate her medication for her. Also told pt if he can not find patients thyroid medication to call the pharmacy that she might only be getting her lisinopril refilled and not her thyroid medication. Told him that he can call anytime he has any questions that I will do my best to help out as much as I can. Pt verbalized understanding. He stated he  would try to be more involved in making sure she is taking her medication correctly. ? ?KP ?

## 2021-08-25 NOTE — Telephone Encounter (Signed)
Medication Refill - Medication: levothyroxine (SYNTHROID) 75 MCG tablet, lisinopril (ZESTRIL) 40 MG tablet  ? ?Has the patient contacted their pharmacy? Yes.   ? ?(Agent: If yes, when and what did the pharmacy advise?) need new Rx  ? ?Preferred Pharmacy (with phone number or street name):  ?[facility] [place], [address] AT [facility] [address] Phone:  [phone]  ?Fax:  [phone]  ?  ? ?Has the patient been seen for an appointment in the last year OR does the patient have an upcoming appointment? Yes.   ? ?Agent: Please be advised that RX refills may take up to 3 business days. We ask that you follow-up with your pharmacy. ? ?

## 2021-08-25 NOTE — Telephone Encounter (Signed)
Called Dean on number listed wife answer stated that he was not in to call his cell phone at [phone]. Called cell left VM to call back. ? ?[place] nurse may give results to patient if they return call to clinic, a CRM has been created. ? ?KP

## 2021-08-25 NOTE — Telephone Encounter (Signed)
Copied from [place] [id number]. Topic: General - Other ?>> Aug 25, 2021  3:28 PM Tessa Lerner A wrote: ?Reason for CRM: The patient's son has called requesting to speak with K. Person when possible ? ?Please contact further when available ?

## 2021-08-26 MED ORDER — LISINOPRIL 40 MG PO TABS: 40.0000 mg | ORAL_TABLET | Freq: Every day | ORAL | 0 refills | Status: DC

## 2021-08-26 MED ORDER — LEVOTHYROXINE SODIUM 75 MCG PO TABS: 75.0000 ug | ORAL_TABLET | Freq: Every day | ORAL | 0 refills | Status: DC

## 2021-08-26 NOTE — Telephone Encounter (Signed)
Spoke to pt son Marlou Sa about medications pt is taking. Son is trying to make sure his mother is taking her medications correctly. He stated the pt did not have much or her thyroid medication and asked if it was time for a refill. Told son that he can call the pharmacy for a refill on the medication and call back if he has a problem getting the medication. He verbalized understanding. ? ?KP ?

## 2021-08-26 NOTE — Telephone Encounter (Signed)
Medication refilled 12/25/2020 #90 3 refills - Pt should have medication until 12/25/2021. ? ?Called [facility] - Spoke with [employer] logistics/support/administrative officer. ? ?Amy states that pt is in the pharmacy nearly everyday asking for refills of medication - usually Lisinopril. Pt should have enough medication. Pt has even used coupons to get more medication when insurance will not pay for medication. ? ?Amy states that pt may need follow up as she suggests that there may be a cognitive issue that needs addressing. ?

## 2021-08-26 NOTE — Telephone Encounter (Signed)
Requested medications are due for refill today.  no ? ?Requested medications are on the active medications list.  yes ? ?Last refill. 12/25/2020 #90 3 refills ? ?Future visit scheduled.   yes ? ?Notes to clinic.  Please see note. Call pharmacy and spoke with pharmacy manager Amy.  ? ? ? ?Requested Prescriptions  ?Pending Prescriptions Disp Refills  ? levothyroxine (SYNTHROID) 75 MCG tablet 90 tablet 3  ?  Sig: Take 1 tablet (75 mcg total) by mouth daily.  ?  ? Endocrinology:  Hypothyroid Agents Failed - 08/25/2021  8:46 AM  ?  ?  Failed - TSH in normal range and within 360 days  ?  TSH  ?Date Value Ref Range Status  ?06/24/2021 5.430 (H) 0.450 - 4.500 uIU/mL Final  ?  ?  ?  ?  Passed - Valid encounter within last 12 months  ?  Recent Outpatient Visits   ? ?      ? 2 months ago Annual physical exam  ? [facility] Glean Hess, MD  ? 8 months ago Benign hypertension  ? [facility] Glean Hess, MD  ? 1 year ago Annual physical exam  ? [facility] Glean Hess, MD  ? 1 year ago Benign hypertension  ? [facility] Glean Hess, MD  ? 2 years ago Annual physical exam  ? [facility] Glean Hess, MD  ? ?  ?  ?Future Appointments   ? ?        ? In 4 months Army Melia Jesse Sans, MD [facility] [place]  ? In 10 months Glean Hess, MD [facility], [facility]  ? ?  ? ? ?  ?  ?  ? lisinopril (ZESTRIL) 40 MG tablet 90 tablet 3  ?  Sig: Take 1 tablet (40 mg total) by mouth daily.  ?  ? Cardiovascular:  ACE Inhibitors Failed - 08/25/2021  8:46 AM  ?  ?  Failed - Cr in normal range and within 180 days  ?  Creatinine, Ser  ?Date Value Ref Range Status  ?06/24/2021 1.25 (H) 0.57 - 1.00 mg/dL Final  ?  ?  ?  ?  Passed - K in normal range and within 180 days  ?  Potassium  ?Date Value Ref Range Status  ?06/24/2021 4.4 3.5 - 5.2 mmol/L Final  ?  ?  ?  ?  Passed - Patient is not pregnant  ?  ?  Passed - Last BP in normal range  ?  BP Readings from Last 1  Encounters:  ?06/24/21 118/70  ?  ?  ?  ?  Passed - Valid encounter within last 6 months  ?  Recent Outpatient Visits   ? ?      ? 2 months ago Annual physical exam  ? [facility] Glean Hess, MD  ? 8 months ago Benign hypertension  ? [facility] Glean Hess, MD  ? 1 year ago Annual physical exam  ? [facility] Glean Hess, MD  ? 1 year ago Benign hypertension  ? [facility] Glean Hess, MD  ? 2 years ago Annual physical exam  ? [facility] Glean Hess, MD  ? ?  ?  ?Future Appointments   ? ?        ? In 4 months Army Melia Jesse Sans, MD [facility], [place]  ? In 10 months  Glean Hess, MD [facility], [place]  ? ?  ? ? ?  ?  ?  ?  ?

## 2021-08-27 ENCOUNTER — Telehealth: Payer: Self-pay | Admitting: Internal Medicine

## 2021-08-27 NOTE — Telephone Encounter (Signed)
[facility] called w/ concerns that pt is confused over her medications.  The pharmacy asked if they could have the son's phone number. ?I told the pharmacy I would call the son and have him call the pharmacy. ?I did call the number on file, spoke to the wife, who is also on DPR, and asked her to relay the message from [place]. ?[facility] states they have concerns pt has a stock pile of Lisinopril.  That is what pt comes in asking for. ?Family is having concerns pt is having memory issues. ?

## 2021-11-23 ENCOUNTER — Other Ambulatory Visit: Payer: Self-pay | Admitting: Internal Medicine

## 2021-11-23 DIAGNOSIS — E890 Postprocedural hypothyroidism: Secondary | ICD-10-CM

## 2021-11-24 NOTE — Telephone Encounter (Signed)
Requested Prescriptions  Pending Prescriptions Disp Refills  . levothyroxine (SYNTHROID) 75 MCG tablet [Pharmacy Med Name: LEVOTHYROXINE 0.'075MG'$  (75MCG) TABS] 90 tablet 0    Sig: TAKE 1 TABLET(75 MCG) BY MOUTH DAILY     Endocrinology:  Hypothyroid Agents Failed - 11/23/2021  7:30 PM      Failed - TSH in normal range and within 360 days    TSH  Date Value Ref Range Status  06/24/2021 5.430 (H) 0.450 - 4.500 uIU/mL Final         Passed - Valid encounter within last 12 months    Recent Outpatient Visits          5 months ago Annual physical exam   [facility] Glean Hess, MD   11 months ago Benign hypertension   [facility] Glean Hess, MD   1 year ago Annual physical exam   [facility] Glean Hess, MD   1 year ago Benign hypertension   [facility] Glean Hess, MD   2 years ago Annual physical exam   [facility] Glean Hess, MD      Future Appointments            In 1 month Army Melia Jesse Sans, MD [facility], [place]   In 7 months Army Melia, Jesse Sans, MD [facility], [facility]

## 2021-11-25 ENCOUNTER — Ambulatory Visit: Payer: Medicare Other

## 2021-12-11 ENCOUNTER — Ambulatory Visit (INDEPENDENT_AMBULATORY_CARE_PROVIDER_SITE_OTHER): Payer: Medicare Other

## 2021-12-11 ENCOUNTER — Telehealth: Payer: Self-pay

## 2021-12-11 VITALS — BP 132/76 | HR 81 | Temp 97.6°F | Resp 17 | Ht 63.78 in | Wt 109.2 lb

## 2021-12-11 DIAGNOSIS — Z0000 Encounter for general adult medical examination without abnormal findings: Secondary | ICD-10-CM

## 2021-12-11 NOTE — Patient Instructions (Signed)

## 2021-12-11 NOTE — Progress Notes (Signed)
Subjective:   Rachel Lopez is a 81 y.o. female who presents for Medicare Annual (Subsequent) preventive examination. The visit was conducted with the patient and her son Marlou Sa Prominitz. Review of Systems    Per HPI unless specifically indicated below Cardiac Risk Factors include: advanced age (>29mn, >>63women);female gender, hypertension, hypothyroidism and hyperlipidemia.         Objective:    Today's Vitals   12/11/21 0910 12/11/21 0915  BP:  132/76  Pulse:  81  Resp: 18 17  Temp:  97.6 F (36.4 C)  TempSrc:  Oral  SpO2:  96%  Weight: 109 lb 3.2 oz (49.5 kg)   Height: 5' 3.78" (1.62 m)   PainSc:  0-No pain   Body mass index is 18.87 kg/m.     12/11/2021   12:09 PM 11/24/2020   11:36 AM 11/12/2019   11:33 AM 10/25/2018    9:18 AM 10/19/2017    9:04 AM 10/18/2016   11:18 AM 04/16/2015    6:52 AM  Advanced Directives  Does Patient Have a Medical Advance Directive? No No No No Yes No Yes  Type of APersonnel officerLiving will  Living will  Does patient want to make changes to medical advance directive?       No - Patient declined  Copy of HPetronilain Chart?     No - copy requested  No - copy requested  Would patient like information on creating a medical advance directive? Yes (MAU/Ambulatory/Procedural Areas - Information given) Yes (MAU/Ambulatory/Procedural Areas - Information given) Yes (MAU/Ambulatory/Procedural Areas - Information given) Yes (MAU/Ambulatory/Procedural Areas - Information given)  Yes (MAU/Ambulatory/Procedural Areas - Information given)     Current Medications (verified) Outpatient Encounter Medications as of 12/11/2021  Medication Sig   ACETAMINOPHEN PO Take by mouth as needed. Tylenol   atorvastatin (LIPITOR) 20 MG tablet Take 20 mg by mouth daily.   levothyroxine (SYNTHROID) 75 MCG tablet TAKE 1 TABLET(75 MCG) BY  MOUTH DAILY   lisinopril (ZESTRIL) 40 MG tablet Take 1 tablet (40 mg total) by mouth daily.   No facility-administered encounter medications on file as of 12/11/2021.    Allergies (verified) Atorvastatin   History: Past Medical History:  Diagnosis Date   Arthritis    Benign hypertension 10/18/2014   Chronic kidney disease    HLD (hyperlipidemia) 10/18/2014   Hyperlipidemia    Hypertension    Hypothyroidism, postablative 10/18/2014   Papilloma of breast 10/28/2014   Renal mass, right 01/01/2015   Thyroid disease    Past Surgical History:  Procedure Laterality Date   ABDOMINAL HYSTERECTOMY     APPENDECTOMY     89yr BREAST BIOPSY Left 10/15/2014   papilloma -u/s bx /clip   BREAST EXCISIONAL BIOPSY Left 04/2015   papilloma removed-neg   HEMORRHOID SURGERY  2014   LAPAROSCOPIC NEPHRECTOMY, HAND ASSISTED Right 04/16/2015   Procedure: HAND ASSISTED LAPAROSCOPIC NEPHRECTOMY;  Surgeon: BrNickie RetortMD;  Location: [facility] ORS;  Service: Urology;  Laterality: Right;   RADICAL HYSTERECTOMY  1969   TOTAL THYROIDECTOMY     age 81 Family History  Problem Relation Age of Onset   Healthy Mother    Healthy Father    Prostate cancer Neg Hx    Breast cancer Neg Hx    Bladder Cancer Neg Hx    Kidney cancer Neg Hx    Social History   Socioeconomic History   Marital  status: Widowed    Spouse name: Not on file   Number of children: 1   Years of education: Not on file   Highest education level: 12th grade  Occupational History   Occupation: Retired  Tobacco Use   Smoking status: Former    Packs/day: 0.50    Years: 15.00    Total pack years: 7.50    Types: Cigarettes    Quit date: 02/18/1979    Years since quitting: 42.8   Smokeless tobacco: Never   Tobacco comments:    smoking cessation materials not required  Vaping Use   Vaping Use: Never used  Substance and Sexual Activity   Alcohol use: No   Drug use: No   Sexual activity: Not Currently  Other Topics Concern   Not  on file  Social History Narrative   Pt is originally from [place]. Moved to [place] in 1969. Lives alone but son lives next door.    Social Determinants of Health   Financial Resource Strain: Low Risk  (12/11/2021)   Overall Financial Resource Strain (CARDIA)    Difficulty of Paying Living Expenses: Not hard at all  Food Insecurity: No Food Insecurity (12/11/2021)   Hunger Vital Sign    Worried About Running Out of Food in the Last Year: Never true    Ran Out of Food in the Last Year: Never true  Transportation Needs: No Transportation Needs (12/11/2021)   PRAPARE - Hydrologist (Medical): No    Lack of Transportation (Non-Medical): No  Physical Activity: Inactive (12/11/2021)   Exercise Vital Sign    Days of Exercise per Week: 0 days    Minutes of Exercise per Session: 0 min  Stress: No Stress Concern Present (12/11/2021)   [place]    Feeling of Stress : Not at all  Social Connections: Socially Isolated (12/11/2021)   Social Connection and Isolation Panel [NHANES]    Frequency of Communication with Friends and Family: More than three times a week    Frequency of Social Gatherings with Friends and Family: More than three times a week    Attends Religious Services: Never    Marine scientist or Organizations: No    Attends Archivist Meetings: Never    Marital Status: Widowed    Tobacco Counseling Counseling given: Not Answered Tobacco comments: smoking cessation materials not required   Clinical Intake:  Pre-visit preparation completed: No  Pain : 0-10 Pain Score: 0-No pain Pain Type: Chronic pain Pain Location: Leg Pain Descriptors / Indicators: Aching Pain Frequency: Intermittent     Nutritional Status: BMI <19  Underweight Nutritional Risks: None Diabetes: No  How often do you need to have someone help you when you read instructions,  pamphlets, or other written materials from your doctor or pharmacy?: 1 - Never  Diabetic?No   Interpreter Needed?: No  Information entered by :: Donnie Mesa, CMA   Activities of Daily Living    12/11/2021    9:14 AM 06/24/2021    9:30 AM  In your present state of health, do you have any difficulty performing the following activities:  Hearing? 0 0  Vision? 1 0  Difficulty concentrating or making decisions? 1 0  Walking or climbing stairs? 1 1  Dressing or bathing? 0 0  Doing errands, shopping? 0 0    Patient Care Team: Glean Hess, MD as PCP - General (Family Medicine)  Indicate  any recent Medical Services you may have received from other than [facility] providers in the past year (date may be approximate).     No hospitalization in the past 12 months. Assessment:   This is a routine wellness examination for Devinne.  Hearing/Vision screen Denies any hearing issues. Denies any vision issues.Overdue for Annual Eye Exam. Recommended annual ophthalmology exams for early detection of glaucoma and other disorders of the eye. Dietary issues and exercise activities discussed: Current Exercise Habits: The patient does not participate in regular exercise at present, Exercise limited by: orthopedic condition(s)   Goals Addressed             This Visit's Progress    Activity and Exercise Increased       Evidence-based guidance:  Review current exercise levels.  Assess patient perspective on exercise or activity level, barriers to increasing activity, motivation and readiness for change.  Recommend or set healthy exercise goal based on individual tolerance.  Encourage small steps toward making change in amount of exercise or activity.  Urge reduction of sedentary activities or screen time.  Promote group activities within the community or with family or support person.  Consider referral to rehabiliation therapist for assessment and exercise/activity plan.   Notes:         Depression Screen    06/24/2021    9:29 AM 12/25/2020    9:30 AM 11/24/2020   11:35 AM 06/23/2020   10:19 AM 11/12/2019   11:32 AM 06/19/2019    9:29 AM 10/25/2018    9:20 AM  PHQ 2/9 Scores  PHQ - 2 Score 4 0 0 0 0 0 0  PHQ- 9 Score 5 0  0       Fall Risk    12/11/2021    9:14 AM 06/24/2021    9:30 AM 12/25/2020    9:30 AM 11/24/2020   11:40 AM 06/23/2020   10:19 AM  Fall Risk   Falls in the past year? 1 0 0 0 0  Number falls in past yr: 1 0 0 0 0  Injury with Fall? 1 0 0 0 0  Risk for fall due to : Impaired balance/gait No Fall Risks No Fall Risks No Fall Risks   Follow up Falls evaluation completed Falls evaluation completed Falls evaluation completed Falls prevention discussed Falls evaluation completed    [place]:  Any stairs in or around the home? No  If so, are there any without handrails?  No stairs in the home  Home free of loose throw rugs in walkways, pet beds, electrical cords, etc? Yes  Adequate lighting in your home to reduce risk of falls? Yes   ASSISTIVE DEVICES UTILIZED TO PREVENT FALLS:  Life alert? No  Use of a cane, walker or w/c? No  Grab bars in the bathroom? No  Shower chair or bench in shower? No  Elevated toilet seat or a handicapped toilet? No   TIMED UP AND GO:  Was the test performed? Yes .  Length of time to ambulate 10 feet: 10 sec.   Gait slow and steady without use of assistive device  Cognitive Function:        12/11/2021    9:18 AM 06/23/2020   10:50 AM 11/12/2019   11:37 AM 10/25/2018    9:22 AM 10/19/2017    9:08 AM  6CIT Screen  What Year? 4 points 0 points 0 points 0 points 0 points  What month? 3 points 0  points 0 points 0 points 0 points  What time? 0 points 0 points 0 points 0 points 0 points  Count back from 20 0 points 0 points 0 points 0 points 0 points  Months in reverse 0 points 0 points 0 points 0 points 0 points  Repeat phrase 10 points 2 points 10 points 4 points 4 points  Total Score 17  points 2 points 10 points 4 points 4 points    Immunizations Immunization History  Administered Date(s) Administered   Fluad Quad(high Dose 65+) 12/25/2019, 12/25/2020   Influenza, High Dose Seasonal PF 01/21/2017, 01/15/2019   Influenza-Unspecified 01/27/2015, 02/15/2018   Moderna SARS-COV2 Booster Vaccination 07/06/2020   PFIZER(Purple Top)SARS-COV-2 Vaccination 06/14/2019, 07/10/2019   Pneumococcal Conjugate-13 09/10/2014   Pneumococcal Polysaccharide-23 10/18/2016    TDAP status: Due, Education has been provided regarding the importance of this vaccine. Advised may receive this vaccine at local pharmacy or Health Dept. Aware to provide a copy of the vaccination record if obtained from local pharmacy or Health Dept. Verbalized acceptance and understanding.  Flu Vaccine status: Up to date  Pneumococcal vaccine status: Up to date  Covid-19 vaccine status: Completed vaccines  Qualifies for Shingles Vaccine? Yes   Zostavax completed No   Shingrix Completed?: No.    Education has been provided regarding the importance of this vaccine. Patient has been advised to call insurance company to determine out of pocket expense if they have not yet received this vaccine. Advised may also receive vaccine at local pharmacy or Health Dept. Verbalized acceptance and understanding.  Screening Tests Health Maintenance  Topic Date Due   TETANUS/TDAP  Never done   Zoster Vaccines- Shingrix (1 of 2) Never done   COVID-19 Vaccine (3 - Pfizer risk series) 08/03/2020   MAMMOGRAM  03/11/2021   INFLUENZA VACCINE  11/17/2021   Pneumonia Vaccine 26+ Years old  Completed   DEXA SCAN  Completed   Hepatitis C Screening  Completed   HPV VACCINES  Aged Out    Health Maintenance  Health Maintenance Due  Topic Date Due   TETANUS/TDAP  Never done   Zoster Vaccines- Shingrix (1 of 2) Never done   COVID-19 Vaccine (3 - Pfizer risk series) 08/03/2020   MAMMOGRAM  03/11/2021   INFLUENZA VACCINE  11/17/2021     Colorectal cancer screening: No longer required.   Mammogram status: No longer required due to 03/11/2020.  DEXA Scan: completed 11/30/2017. Results reflect: Bone density results: OSTEOPOROSIS. Repeat every 2 years. DEXA: 11/2017 normal spine/osteopenia hip Pap smear: none Lung Cancer Screening: (Low Dose CT Chest recommended if Age 59-80 years, 30 pack-year currently smoking OR have quit w/in 15years.) does not qualify.   Additional Screening:  Hepatitis C Screening: does qualify; Completed 06/23/2020  Vision Screening: Recommended annual ophthalmology exams for early detection of glaucoma and other disorders of the eye. Is the patient up to date with their annual eye exam?  No  Who is the provider or what is the name of the office in which the patient attends annual eye exams? The pt does not remember who she saw several years ago. She said she will schedule a opthalmology appt.  If pt is not established with a provider, would they like to be referred to a provider to establish care? No .   Dental Screening: Recommended annual dental exams for proper oral hygiene  Community Resource Referral / Chronic Care Management: CRR required this visit?  No   CCM required this visit?  No  Plan:     I have personally reviewed and noted the following in the patient's chart:   Medical and social history Use of alcohol, tobacco or illicit drugs  Current medications and supplements including opioid prescriptions. Patient is not currently taking opioid prescriptions. Functional ability and status Nutritional status Physical activity Advanced directives List of other physicians Hospitalizations, surgeries, and ER visits in previous 12 months Vitals Screenings to include cognitive, depression, and falls Referrals and appointments  In addition, I have reviewed and discussed with patient certain preventive protocols, quality metrics, and best practice recommendations. A written  personalized care plan for preventive services as well as general preventive health recommendations were provided to patient.    Ms. Renfrow , Thank you for taking time to come for your Medicare Wellness Visit. I appreciate your ongoing commitment to your health goals. Please review the following plan we discussed and let me know if I can assist you in the future.   These are the goals we discussed:  Goals      Activity and Exercise Increased     Evidence-based guidance:  Review current exercise levels.  Assess patient perspective on exercise or activity level, barriers to increasing activity, motivation and readiness for change.  Recommend or set healthy exercise goal based on individual tolerance.  Encourage small steps toward making change in amount of exercise or activity.  Urge reduction of sedentary activities or screen time.  Promote group activities within the community or with family or support person.  Consider referral to rehabiliation therapist for assessment and exercise/activity plan.   Notes:      Cut out extra servings     DIET - INCREASE WATER INTAKE     Recommend to drink at least 6-8 8oz glasses of water per day.        This is a list of the screening recommended for you and due dates:  Health Maintenance  Topic Date Due   Tetanus Vaccine  Never done   Zoster (Shingles) Vaccine (1 of 2) Never done   COVID-19 Vaccine (3 - Pfizer risk series) 08/03/2020   Mammogram  03/11/2021   Flu Shot  11/17/2021   Pneumonia Vaccine  Completed   DEXA scan (bone density measurement)  Completed   Hepatitis C Screening: USPSTF Recommendation to screen - Ages 22-79 yo.  Completed   HPV Vaccine  Aged [address], [place]   12/11/2021   Nurse Notes: 30 minute face to face visit. The patient's son has some concerns about the patient current cognitive status. He reports that he has noticed a major decline in her memory. I informed the patient and her son that she  currently has a follow-up appt scheduled with Dr. Army Melia on 12/25/2021 and that they can address the concerns at that appointment. I also assured the son that I would notify Dr. Army Melia of his concerns and  6CIT scores.

## 2021-12-18 NOTE — Telephone Encounter (Signed)
Error. No additional note.

## 2021-12-25 ENCOUNTER — Ambulatory Visit (INDEPENDENT_AMBULATORY_CARE_PROVIDER_SITE_OTHER): Payer: Medicare Other | Admitting: Internal Medicine

## 2021-12-25 ENCOUNTER — Encounter: Payer: Self-pay | Admitting: Internal Medicine

## 2021-12-25 VITALS — BP 136/80 | HR 84 | Ht 64.0 in | Wt 108.0 lb

## 2021-12-25 DIAGNOSIS — G3184 Mild cognitive impairment, so stated: Secondary | ICD-10-CM | POA: Diagnosis not present

## 2021-12-25 DIAGNOSIS — I10 Essential (primary) hypertension: Secondary | ICD-10-CM | POA: Diagnosis not present

## 2021-12-25 DIAGNOSIS — E890 Postprocedural hypothyroidism: Secondary | ICD-10-CM

## 2021-12-25 DIAGNOSIS — N183 Chronic kidney disease, stage 3 unspecified: Secondary | ICD-10-CM

## 2021-12-25 DIAGNOSIS — Z23 Encounter for immunization: Secondary | ICD-10-CM

## 2021-12-25 MED ORDER — MEMANTINE HCL ER 7 MG PO CP24: 7.0000 mg | ORAL_CAPSULE | Freq: Every day | ORAL | 5 refills | Status: DC

## 2021-12-25 NOTE — Progress Notes (Signed)
Date:  12/25/2021   Name:  Rachel Lopez   DOB:  12/18/1940   MRN:  [id number]   Chief Complaint: Hypertension, Hypothyroidism, and Flu Vaccine  Hypertension This is a chronic problem. The problem is controlled. Pertinent negatives include no chest pain, headaches, palpitations or shortness of breath. Past treatments include ACE inhibitors. Hypertensive end-organ damage includes kidney disease. There is no history of CAD/MI or CVA. Identifiable causes of hypertension include a thyroid problem.  Thyroid Problem Presents for follow-up visit. Patient reports no anxiety, cold intolerance, constipation, depressed mood, diarrhea, fatigue, palpitations, weight gain or weight loss. The symptoms have been stable.  Memory concerns - some issues not finding her way to places that are not visited often. She lives next door to her son.  She did not realize that her AC was broken this summer - it was 87 deg inside.  She can not remember what she had for supper.  She denies being depressed but she does not leave the house often and does not have any activities outside the house.  Lab Results  Component Value Date   NA 137 06/24/2021   K 4.4 06/24/2021   CO2 18 (L) 06/24/2021   GLUCOSE 80 06/24/2021   BUN 16 06/24/2021   CREATININE 1.25 (H) 06/24/2021   CALCIUM 9.1 06/24/2021   EGFR 44 (L) 06/24/2021   GFRNONAA 36 (L) 12/25/2019   Lab Results  Component Value Date   CHOL 252 (H) 06/24/2021   HDL 60 06/24/2021   LDLCALC 173 (H) 06/24/2021   TRIG 108 06/24/2021   CHOLHDL 4.2 06/24/2021   Lab Results  Component Value Date   TSH 5.430 (H) 06/24/2021   No results found for: "HGBA1C" Lab Results  Component Value Date   WBC 8.3 06/24/2021   HGB 13.2 06/24/2021   HCT 39.8 06/24/2021   MCV 85 06/24/2021   PLT 395 06/24/2021   Lab Results  Component Value Date   ALT 7 06/24/2021   AST 12 06/24/2021   ALKPHOS 99 06/24/2021   BILITOT 0.3 06/24/2021   No results found for: "25OHVITD2",  "25OHVITD3", "VD25OH"   Review of Systems  Constitutional:  Negative for fatigue, unexpected weight change, weight gain and weight loss.  HENT:  Negative for nosebleeds.   Eyes:  Negative for visual disturbance.  Respiratory:  Negative for cough, chest tightness, shortness of breath and wheezing.   Cardiovascular:  Negative for chest pain, palpitations and leg swelling.  Gastrointestinal:  Negative for abdominal pain, constipation and diarrhea.  Endocrine: Negative for cold intolerance.  Musculoskeletal:  Positive for arthralgias and gait problem.  Neurological:  Negative for dizziness, weakness, light-headedness and headaches.  Psychiatric/Behavioral:  Positive for confusion and decreased concentration. Negative for sleep disturbance. The patient is not nervous/anxious.     Patient Active Problem List   Diagnosis Date Noted   Arthritis of knee, right 11/03/2017   Chronic renal insufficiency, stage 3 (moderate) ([place]) 04/21/2016   Angiomyolipoma of kidney 01/01/2015   HLD (hyperlipidemia) 10/18/2014   Benign hypertension 10/18/2014   Hypothyroidism, postablative 10/18/2014   Intraductal papilloma of left breast 10/18/2014    Allergies  Allergen Reactions   Atorvastatin Other (See Comments)    Dizziness    Past Surgical History:  Procedure Laterality Date   ABDOMINAL HYSTERECTOMY     APPENDECTOMY     31yr   BREAST BIOPSY Left 10/15/2014   papilloma -u/s bx /clip   BREAST EXCISIONAL BIOPSY Left 04/2015   papilloma removed-neg  HEMORRHOID SURGERY  2014   LAPAROSCOPIC NEPHRECTOMY, HAND ASSISTED Right 04/16/2015   Procedure: HAND ASSISTED LAPAROSCOPIC NEPHRECTOMY;  Surgeon: Nickie Retort, MD;  Location: [facility] ORS;  Service: Urology;  Laterality: Right;   RADICAL HYSTERECTOMY  1969   TOTAL THYROIDECTOMY     age 31    Social History   Tobacco Use   Smoking status: Former    Packs/day: 0.50    Years: 15.00    Total pack years: 7.50    Types: Cigarettes    Quit  date: 02/18/1979    Years since quitting: 42.8   Smokeless tobacco: Never   Tobacco comments:    smoking cessation materials not required  Vaping Use   Vaping Use: Never used  Substance Use Topics   Alcohol use: No   Drug use: No     Medication list has been reviewed and updated.  Current Meds  Medication Sig   memantine (NAMENDA XR) 7 MG CP24 24 hr capsule Take 1 capsule (7 mg total) by mouth daily.       12/25/2021    8:14 AM 06/24/2021    9:30 AM 12/25/2020    9:30 AM 06/23/2020   10:19 AM  GAD 7 : Generalized Anxiety Score  Nervous, Anxious, on Edge 0 0 0 0  Control/stop worrying 0 0 0 0  Worry too much - different things 0 0 0 0  Trouble relaxing 0 0 0 0  Restless 0 0 0 0  Easily annoyed or irritable 0 0 0 0  Afraid - awful might happen 0 0 0 0  Total GAD 7 Score 0 0 0 0  Anxiety Difficulty Not difficult at all Not difficult at all  Not difficult at all       12/25/2021    8:13 AM 06/24/2021    9:29 AM 12/25/2020    9:30 AM  Depression screen PHQ 2/9  Decreased Interest 0 2 0  Down, Depressed, Hopeless 0 2 0  PHQ - 2 Score 0 4 0  Altered sleeping 0 0 0  Tired, decreased energy 0 0 0  Change in appetite 0 1 0  Feeling bad or failure about yourself  0 0 0  Trouble concentrating 0 0 0  Moving slowly or fidgety/restless 0 0 0  Suicidal thoughts 0 0 0  PHQ-9 Score 0 5 0  Difficult doing work/chores Not difficult at all Not difficult at all Not difficult at all      12/11/2021    9:18 AM 06/23/2020   10:50 AM 11/12/2019   11:37 AM 10/25/2018    9:22 AM 10/19/2017    9:08 AM  6CIT Screen  What Year? 4 points 0 points 0 points 0 points 0 points  What month? 3 points 0 points 0 points 0 points 0 points  What time? 0 points 0 points 0 points 0 points 0 points  Count back from 20 0 points 0 points 0 points 0 points 0 points  Months in reverse 0 points 0 points 0 points 0 points 0 points  Repeat phrase 10 points 2 points 10 points 4 points 4 points  Total Score 17 points 2  points 10 points 4 points 4 points      BP Readings from Last 3 Encounters:  12/25/21 136/80  12/11/21 132/76  06/24/21 118/70    Physical Exam Vitals and nursing note reviewed.  Constitutional:      General: She is not in acute distress.    Appearance:  She is well-developed.  HENT:     Head: Normocephalic and atraumatic.  Pulmonary:     Effort: Pulmonary effort is normal. No respiratory distress.  Skin:    General: Skin is warm and dry.     Findings: No rash.  Neurological:     Mental Status: She is alert and oriented to person, place, and time.  Psychiatric:        Attention and Perception: Attention normal.        Mood and Affect: Mood normal.        Behavior: Behavior normal.        Cognition and Memory: Memory is impaired. She exhibits impaired recent memory.     Wt Readings from Last 3 Encounters:  12/25/21 108 lb (49 kg)  12/11/21 109 lb 3.2 oz (49.5 kg)  06/24/21 116 lb 12.8 oz (53 kg)    BP 136/80   Pulse 84   Ht $R'5\' 4"'QI$  (1.626 m)   Wt 108 lb (49 kg)   BMI 18.54 kg/m   Assessment and Plan: 1. Benign hypertension Clinically stable exam with well controlled BP. Tolerating medications without side effects at this time. Pt to continue current regimen and low sodium diet; benefits of regular exercise as able discussed.  2. Hypothyroidism, postablative Check labs - TSH + free T4  3. Chronic renal insufficiency, stage 3 (moderate) (HCC) Monitor for worsening - Comprehensive metabolic panel  4. MCI (mild cognitive impairment) with memory loss Will get screening labs; does not appear to be depressed She is denies that she has any memory issues. Discussed with son and patient - will start Namenda. - Vitamin B12 - Sedimentation rate - memantine (NAMENDA XR) 7 MG CP24 24 hr capsule; Take 1 capsule (7 mg total) by mouth daily.  Dispense: 30 capsule; Refill: 5  5. Need for immunization against influenza - Flu Vaccine QUAD High Dose(Fluad)   Partially  dictated using Editor, commissioning. Any errors are unintentional.  Halina Maidens, MD [facility]  12/25/2021

## 2021-12-26 LAB — COMPREHENSIVE METABOLIC PANEL
ALT: 6 IU/L (ref 0–32)
AST: 17 IU/L (ref 0–40)
Albumin/Globulin Ratio: 1.2 (ref 1.2–2.2)
Albumin: 3.6 g/dL — ABNORMAL LOW (ref 3.8–4.8)
Alkaline Phosphatase: 106 IU/L (ref 44–121)
BUN/Creatinine Ratio: 11 — ABNORMAL LOW (ref 12–28)
BUN: 14 mg/dL (ref 8–27)
Bilirubin Total: 0.3 mg/dL (ref 0.0–1.2)
CO2: 22 mmol/L (ref 20–29)
Calcium: 8.8 mg/dL (ref 8.7–10.3)
Chloride: 98 mmol/L (ref 96–106)
Creatinine, Ser: 1.29 mg/dL — ABNORMAL HIGH (ref 0.57–1.00)
Globulin, Total: 2.9 g/dL (ref 1.5–4.5)
Glucose: 84 mg/dL (ref 70–99)
Potassium: 3.9 mmol/L (ref 3.5–5.2)
Sodium: 135 mmol/L (ref 134–144)
Total Protein: 6.5 g/dL (ref 6.0–8.5)
eGFR: 42 mL/min/{1.73_m2} — ABNORMAL LOW (ref >59)

## 2021-12-26 LAB — VITAMIN B12: Vitamin B-12: 196 pg/mL — ABNORMAL LOW (ref 232–1245)

## 2021-12-26 LAB — TSH+FREE T4
Free T4: 1.6 ng/dL (ref 0.82–1.77)
TSH: 7.04 u[IU]/mL — ABNORMAL HIGH (ref 0.450–4.500)

## 2021-12-26 LAB — SEDIMENTATION RATE: Sed Rate: 15 mm/hr (ref 0–40)

## 2022-02-10 ENCOUNTER — Other Ambulatory Visit: Payer: Self-pay | Admitting: Internal Medicine

## 2022-02-11 NOTE — Telephone Encounter (Signed)
Requested medication (s) are due for refill today: yes  Requested medication (s) are on the active medication list: yes  Last refill:  12/11/21  Future visit scheduled: yes  Notes to clinic:  Unable to refill per protocol, last refill by another provider.  Historical provider.     Requested Prescriptions  Pending Prescriptions Disp Refills   atorvastatin (LIPITOR) 20 MG tablet [Pharmacy Med Name: ATORVASTATIN '20MG'$  TABLETS] 90 tablet     Sig: TAKE 1 TABLET(20 MG) BY MOUTH AT BEDTIME     Cardiovascular:  Antilipid - Statins Failed - 02/10/2022  8:37 PM      Failed - Lipid Panel in normal range within the last 12 months    Cholesterol, Total  Date Value Ref Range Status  06/24/2021 252 (H) 100 - 199 mg/dL Final   LDL Chol Calc (NIH)  Date Value Ref Range Status  06/24/2021 173 (H) 0 - 99 mg/dL Final   HDL  Date Value Ref Range Status  06/24/2021 60 >39 mg/dL Final   Triglycerides  Date Value Ref Range Status  06/24/2021 108 0 - 149 mg/dL Final         Passed - Patient is not pregnant      Passed - Valid encounter within last 12 months    Recent Outpatient Visits           1 month ago Benign hypertension   [place] [facility] at [facility], Jesse Sans, MD   7 months ago Annual physical exam   [place] [facility] Medicine at [facility], Jesse Sans, MD   1 year ago Benign hypertension   [place] [facility] Care and Sports Medicine at [facility], Jesse Sans, MD   1 year ago Annual physical exam   [facility] at [facility], Jesse Sans, MD   2 years ago Benign hypertension    Primary Care and Sports Medicine at [facility], Jesse Sans, MD       Future Appointments             In 4 months Army Melia, Jesse Sans, MD [place] [facility] Medicine at [facility], [facility]

## 2022-02-12 ENCOUNTER — Other Ambulatory Visit: Payer: Self-pay | Admitting: Internal Medicine

## 2022-02-12 DIAGNOSIS — I10 Essential (primary) hypertension: Secondary | ICD-10-CM

## 2022-02-12 NOTE — Telephone Encounter (Signed)
Requested Prescriptions  Pending Prescriptions Disp Refills  . lisinopril (ZESTRIL) 40 MG tablet [Pharmacy Med Name: LISINOPRIL '40MG'$  TABLETS] 90 tablet 1    Sig: TAKE 1 TABLET(40 MG) BY MOUTH DAILY     Cardiovascular:  ACE Inhibitors Failed - 02/12/2022 10:08 AM      Failed - Cr in normal range and within 180 days    Creatinine, Ser  Date Value Ref Range Status  12/25/2021 1.29 (H) 0.57 - 1.00 mg/dL Final         Passed - K in normal range and within 180 days    Potassium  Date Value Ref Range Status  12/25/2021 3.9 3.5 - 5.2 mmol/L Final         Passed - Patient is not pregnant      Passed - Last BP in normal range    BP Readings from Last 1 Encounters:  12/25/21 136/80         Passed - Valid encounter within last 6 months    Recent Outpatient Visits          1 month ago Benign hypertension   [place] [facility] Medicine at [facility], Jesse Sans, MD   7 months ago Annual physical exam   [place] [facility] Care and [facility] Medicine at [facility], Jesse Sans, MD   1 year ago Benign hypertension   [place] [facility] Care and Sports Medicine at [facility], Jesse Sans, MD   1 year ago Annual physical exam   [facility] and [facility] at [facility], Jesse Sans, MD   2 years ago Benign hypertension   [place] Primary Care and Sports Medicine at [facility] Jesse Sans, MD      Future Appointments            In 4 months Army Melia, Jesse Sans, MD [place] Primary Care and Sports Medicine at [facility], [facility]

## 2022-04-15 ENCOUNTER — Telehealth: Payer: Self-pay | Admitting: Internal Medicine
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
## 2022-04-15 NOTE — Telephone Encounter (Signed)
Called son scheduled an appt.  KP

## 2022-05-18 ENCOUNTER — Other Ambulatory Visit: Payer: Self-pay | Admitting: Internal Medicine

## 2022-05-18 DIAGNOSIS — E890 Postprocedural hypothyroidism: Secondary | ICD-10-CM

## 2022-05-21 ENCOUNTER — Ambulatory Visit (INDEPENDENT_AMBULATORY_CARE_PROVIDER_SITE_OTHER): Payer: Medicare Other | Admitting: Internal Medicine

## 2022-05-21 ENCOUNTER — Encounter: Payer: Self-pay | Admitting: Internal Medicine

## 2022-05-21 VITALS — BP 126/72 | HR 79 | Ht 64.0 in | Wt 111.0 lb

## 2022-05-21 DIAGNOSIS — I10 Essential (primary) hypertension: Secondary | ICD-10-CM

## 2022-05-21 DIAGNOSIS — N183 Chronic kidney disease, stage 3 unspecified: Secondary | ICD-10-CM | POA: Diagnosis not present

## 2022-05-21 DIAGNOSIS — F0390 Unspecified dementia without behavioral disturbance: Secondary | ICD-10-CM | POA: Insufficient documentation

## 2022-05-21 DIAGNOSIS — E538 Deficiency of other specified B group vitamins: Secondary | ICD-10-CM

## 2022-05-21 DIAGNOSIS — M1711 Unilateral primary osteoarthritis, right knee: Secondary | ICD-10-CM | POA: Diagnosis not present

## 2022-05-21 DIAGNOSIS — G3184 Mild cognitive impairment, so stated: Secondary | ICD-10-CM

## 2022-05-21 DIAGNOSIS — E890 Postprocedural hypothyroidism: Secondary | ICD-10-CM | POA: Diagnosis not present

## 2022-05-21 NOTE — Assessment & Plan Note (Signed)
Tried Namenda last visit but was being dosed incorrectly B12 low - now supplemented No longer driving and has close family care Will continue to monitor and consider Neurology referral if needed

## 2022-05-21 NOTE — Assessment & Plan Note (Signed)
Supplemented on current dose No new symptoms Weight is stable/up a few pounds

## 2022-05-21 NOTE — Assessment & Plan Note (Signed)
Monitoring for change Avoid nsaids, tylenol acceptable Continue adequate fluid intake

## 2022-05-21 NOTE — Assessment & Plan Note (Signed)
Now on oral supplement

## 2022-05-21 NOTE — Assessment & Plan Note (Signed)
Clinically stable exam with well controlled BP on lisinopril. Tolerating medications without side effects. Pt to continue current regimen and low sodium diet.  
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
## 2022-05-21 NOTE — Assessment & Plan Note (Signed)
Agree with daily tylenol to help with function and mood

## 2022-05-22 LAB — TSH+T4F+T3FREE
Free T4: 1.42 ng/dL (ref 0.82–1.77)
T3, Free: 1.7 pg/mL — ABNORMAL LOW (ref 2.0–4.4)
TSH: 5.46 u[IU]/mL — ABNORMAL HIGH (ref 0.450–4.500)

## 2022-05-22 LAB — BASIC METABOLIC PANEL
BUN/Creatinine Ratio: 12 (ref 12–28)
BUN: 16 mg/dL (ref 8–27)
CO2: 22 mmol/L (ref 20–29)
Calcium: 8.3 mg/dL — ABNORMAL LOW (ref 8.7–10.3)
Chloride: 102 mmol/L (ref 96–106)
Creatinine, Ser: 1.29 mg/dL — ABNORMAL HIGH (ref 0.57–1.00)
Glucose: 85 mg/dL (ref 70–99)
Potassium: 4.1 mmol/L (ref 3.5–5.2)
Sodium: 139 mmol/L (ref 134–144)
eGFR: 42 mL/min/{1.73_m2} — ABNORMAL LOW (ref >59)

## 2022-05-22 LAB — VITAMIN B12: Vitamin B-12: 751 pg/mL (ref 232–1245)

## 2022-05-24 ENCOUNTER — Telehealth: Payer: Self-pay | Admitting: *Deleted

## 2022-05-24 ENCOUNTER — Other Ambulatory Visit: Payer: Self-pay | Admitting: Internal Medicine

## 2022-05-24 DIAGNOSIS — E890 Postprocedural hypothyroidism: Secondary | ICD-10-CM

## 2022-05-24 MED ORDER — LIOTHYRONINE SODIUM 5 MCG PO TABS: 5.0000 ug | ORAL_TABLET | Freq: Every day | ORAL | 0 refills | Status: DC

## 2022-05-24 NOTE — Progress Notes (Signed)
Please review. Message from [facility].  KP

## 2022-05-24 NOTE — Progress Notes (Signed)
Please review. What medication did you want to add?  KP

## 2022-05-24 NOTE — Telephone Encounter (Signed)
'  Rachel Lopez' returning call for labs. States husband was called, aware of lab results.  Rachel Lopez is calling to ensure call was not in reference to earlier encounter routed to practice regarding change in pt's mental status.  Adds, "She is very good at faking it for 10 minutes or so which is what she did when she saw Dr. Army Melia."  States "Does not remember being there." Assured encounter routed to practice for PCPs review and final disposition.

## 2022-05-26 NOTE — Progress Notes (Signed)
Called pt left VM to call back.  [facility] may give results if patient returns call - CRM created.  KP

## 2022-06-28 ENCOUNTER — Encounter: Payer: Medicare Other | Admitting: Internal Medicine

## 2022-07-07 ENCOUNTER — Encounter: Payer: Medicare Other | Admitting: Internal Medicine

## 2022-08-28 ENCOUNTER — Other Ambulatory Visit: Payer: Self-pay | Admitting: Internal Medicine

## 2022-08-28 DIAGNOSIS — I10 Essential (primary) hypertension: Secondary | ICD-10-CM

## 2022-08-28 DIAGNOSIS — E890 Postprocedural hypothyroidism: Secondary | ICD-10-CM

## 2022-08-30 NOTE — Telephone Encounter (Signed)
Requested Prescriptions  Pending Prescriptions Disp Refills   lisinopril (ZESTRIL) 40 MG tablet [Pharmacy Med Name: LISINOPRIL 40MG  TABLETS] 90 tablet 1    Sig: TAKE 1 TABLET(40 MG) BY MOUTH DAILY     Cardiovascular:  ACE Inhibitors Failed - 08/28/2022  9:13 AM      Failed - Cr in normal range and within 180 days    Creatinine, Ser  Date Value Ref Range Status  05/21/2022 1.29 (H) 0.57 - 1.00 mg/dL Final         Passed - K in normal range and within 180 days    Potassium  Date Value Ref Range Status  05/21/2022 4.1 3.5 - 5.2 mmol/L Final         Passed - Patient is not pregnant      Passed - Last BP in normal range    BP Readings from Last 1 Encounters:  05/21/22 126/72         Passed - Valid encounter within last 6 months    Recent Outpatient Visits           3 months ago Benign hypertension   [place] [facility] Medicine at [facility], Nyoka Cowden, MD   8 months ago Benign hypertension   [place] Primary Care & Sports Medicine at [facility], Nyoka Cowden, MD   1 year ago Annual physical exam   [facility] at [facility], Nyoka Cowden, MD   1 year ago Benign hypertension   [place] Primary Care & Sports Medicine at [facility], Nyoka Cowden, MD   2 years ago Annual physical exam   [facility] Primary Care & [facility] Medicine at [facility], Nyoka Cowden, MD       Future Appointments             In 3 months Judithann Graves Nyoka Cowden, MD [facility] Medicine at [facility], [facility]             liothyronine (CYTOMEL) 5 MCG tablet [Pharmacy Med Name: LIOTHYRONINE TABLETS] 90 tablet 0    Sig: TAKE 1 TABLET(5 MCG) BY MOUTH DAILY     Endocrinology:  Hypothyroid Agents Failed - 08/28/2022  9:13 AM      Failed - TSH in normal range and within 360 days    TSH  Date Value Ref Range Status  05/21/2022 5.460 (H) 0.450 - 4.500 uIU/mL Final          Passed - Valid encounter within last 12 months    Recent Outpatient Visits           3 months ago Benign hypertension   [place] [facility] Medicine at [facility], Nyoka Cowden, MD   8 months ago Benign hypertension   [place] [facility] Care & Sports Medicine at [facility], Nyoka Cowden, MD   1 year ago Annual physical exam   [facility] at [facility], Nyoka Cowden, MD   1 year ago Benign hypertension   [place] [facility] Medicine at [facility], Nyoka Cowden, MD   2 years ago Annual physical exam   [facility] Primary Care & Sports Medicine at [facility], Nyoka Cowden, MD       Future Appointments             In 3 months Bari Edward  Rexene Edison, MD [facility] Primary Care & Sports Medicine at [facility], [facility]

## 2022-08-30 NOTE — Telephone Encounter (Signed)
Requested medications are due for refill today.  yes  Requested medications are on the active medications list.  yes  Last refill. 05/24/2022 #90 0 rf  Future visit scheduled.   yes  Notes to clinic.  Abnormal labs.    Requested Prescriptions  Pending Prescriptions Disp Refills   liothyronine (CYTOMEL) 5 MCG tablet [Pharmacy Med Name: LIOTHYRONINE TABLETS] 90 tablet 0    Sig: TAKE 1 TABLET(5 MCG) BY MOUTH DAILY     Endocrinology:  Hypothyroid Agents Failed - 08/28/2022  9:13 AM      Failed - TSH in normal range and within 360 days    TSH  Date Value Ref Range Status  05/21/2022 5.460 (H) 0.450 - 4.500 uIU/mL Final         Passed - Valid encounter within last 12 months    Recent Outpatient Visits           3 months ago Benign hypertension   [place] [facility] Medicine at [facility], Nyoka Cowden, MD   8 months ago Benign hypertension   [place] [facility] Medicine at [facility], Nyoka Cowden, MD   1 year ago Annual physical exam   [place] [facility] Care & [facility] Medicine at [facility], Nyoka Cowden, MD   1 year ago Benign hypertension   [place] [facility] Care & Sports Medicine at [facility], Nyoka Cowden, MD   2 years ago Annual physical exam   [place] Primary Care & Sports Medicine at [facility], Nyoka Cowden, MD       Future Appointments             In 3 months Judithann Graves Nyoka Cowden, MD [facility] Primary Care & Sports Medicine at [facility]            Signed Prescriptions Disp Refills   lisinopril (ZESTRIL) 40 MG tablet 90 tablet 0    Sig: TAKE 1 TABLET(40 MG) BY MOUTH DAILY     Cardiovascular:  ACE Inhibitors Failed - 08/28/2022  9:13 AM      Failed - Cr in normal range and within 180 days    Creatinine, Ser  Date Value Ref Range Status  05/21/2022 1.29 (H) 0.57 - 1.00 mg/dL Final         Passed - K in normal range and within 180 days    Potassium  Date  Value Ref Range Status  05/21/2022 4.1 3.5 - 5.2 mmol/L Final         Passed - Patient is not pregnant      Passed - Last BP in normal range    BP Readings from Last 1 Encounters:  05/21/22 126/72         Passed - Valid encounter within last 6 months    Recent Outpatient Visits           3 months ago Benign hypertension   [place] [facility] Medicine at [facility], Nyoka Cowden, MD   8 months ago Benign hypertension   [place] [facility] Care & Sports Medicine at [facility], Nyoka Cowden, MD   1 year ago Annual physical exam   [facility] Primary Care & Sports Medicine at [facility], Nyoka Cowden, MD   1 year ago Benign hypertension    Primary Care & Sports Medicine at [facility], Nyoka Cowden, MD   2 years ago Annual  physical exam   [facility] Medicine at [facility], Nyoka Cowden, MD       Future Appointments             In 3 months Judithann Graves, Nyoka Cowden, MD [facility] Primary Care & Sports Medicine at [facility], [facility]

## 2022-11-21 ENCOUNTER — Other Ambulatory Visit: Payer: Self-pay | Admitting: Internal Medicine

## 2022-11-21 DIAGNOSIS — I10 Essential (primary) hypertension: Secondary | ICD-10-CM

## 2022-11-22 ENCOUNTER — Other Ambulatory Visit: Payer: Self-pay | Admitting: Internal Medicine

## 2022-11-22 DIAGNOSIS — I10 Essential (primary) hypertension: Secondary | ICD-10-CM

## 2022-11-30 ENCOUNTER — Encounter: Payer: Self-pay | Admitting: Internal Medicine

## 2022-11-30 ENCOUNTER — Telehealth: Payer: Self-pay | Admitting: Internal Medicine
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
## 2022-12-01 ENCOUNTER — Encounter: Payer: Self-pay | Admitting: Internal Medicine

## 2022-12-01 ENCOUNTER — Ambulatory Visit (INDEPENDENT_AMBULATORY_CARE_PROVIDER_SITE_OTHER): Payer: Medicare Other | Admitting: Internal Medicine

## 2022-12-01 VITALS — BP 104/68 | HR 92 | Ht 64.0 in | Wt 108.0 lb

## 2022-12-01 DIAGNOSIS — E890 Postprocedural hypothyroidism: Secondary | ICD-10-CM

## 2022-12-01 DIAGNOSIS — N183 Chronic kidney disease, stage 3 unspecified: Secondary | ICD-10-CM

## 2022-12-01 DIAGNOSIS — F03B0 Unspecified dementia, moderate, without behavioral disturbance, psychotic disturbance, mood disturbance, and anxiety: Secondary | ICD-10-CM | POA: Diagnosis not present

## 2022-12-01 DIAGNOSIS — Z0000 Encounter for general adult medical examination without abnormal findings: Secondary | ICD-10-CM | POA: Diagnosis not present

## 2022-12-01 DIAGNOSIS — M810 Age-related osteoporosis without current pathological fracture: Secondary | ICD-10-CM

## 2022-12-01 DIAGNOSIS — M1711 Unilateral primary osteoarthritis, right knee: Secondary | ICD-10-CM

## 2022-12-01 DIAGNOSIS — E782 Mixed hyperlipidemia: Secondary | ICD-10-CM

## 2022-12-01 DIAGNOSIS — E538 Deficiency of other specified B group vitamins: Secondary | ICD-10-CM

## 2022-12-01 DIAGNOSIS — I10 Essential (primary) hypertension: Secondary | ICD-10-CM | POA: Diagnosis not present

## 2022-12-01 NOTE — Assessment & Plan Note (Signed)
Monitoring regularly; avoid NSAIDS

## 2022-12-01 NOTE — Assessment & Plan Note (Signed)
Persistent right knee OA and pain limiting activities Recommend Tylenol  scheduled dosing

## 2022-12-01 NOTE — Assessment & Plan Note (Signed)
Not currently on medication by patient choice. Recommend healthy diet and exercise

## 2022-12-01 NOTE — Assessment & Plan Note (Signed)
Normal exam with stable BP on lisinopril. No concerns or side effects to current medication. No change in regimen; continue low sodium diet.

## 2022-12-01 NOTE — Assessment & Plan Note (Signed)
On daily supplement 

## 2022-12-01 NOTE — Assessment & Plan Note (Signed)
Tried Namenda but was taking it incorrectly so discontinued by family Continues to gradually worsen but still living alone with close family support No longer driving but very unhappy about it. Discussed Neurology referral Letter written to [facility] so she can get a government ID in place of her DL

## 2022-12-01 NOTE — Assessment & Plan Note (Signed)
Supplemented Lab Results  Component Value Date   TSH 5.460 (H) 05/21/2022

## 2022-12-01 NOTE — Progress Notes (Signed)
Date:  12/01/2022   Name:  Rachel LASSETTER   DOB:  04-02-1941   MRN:  [id number]   Chief Complaint: Annual Exam Rachel Lopez is a 82 y.o. female who presents today for her Complete Annual Exam. She feels well. She reports exercising - not at this time.. She reports she is sleeping well. Breast complaints - none. She continues to live independently with her son next door.  She does very little cooking - either oatmeal or frozen dinners.  She does not exercise due to severe OA of the right knee.  Mammogram: 2021 aged out DEXA: 2019 osteoporosis Colonoscopy: 2013 aged out  Health Maintenance Due  Topic Date Due   DTaP/Tdap/Td (1 - Tdap) Never done   Zoster Vaccines- Shingrix (1 of 2) Never done   COVID-19 Vaccine (3 - Pfizer risk series) 08/03/2020   MAMMOGRAM  03/11/2021   INFLUENZA VACCINE  11/18/2022   Medicare Annual Wellness (AWV)  12/12/2022    Immunization History  Administered Date(s) Administered   Fluad Quad(high Dose 65+) 12/25/2019, 12/25/2020, 12/25/2021   Influenza, High Dose Seasonal PF 01/21/2017, 01/15/2019   Influenza-Unspecified 01/27/2015, 02/15/2018   Moderna SARS-COV2 Booster Vaccination 07/06/2020   PFIZER(Purple Top)SARS-COV-2 Vaccination 06/14/2019, 07/10/2019   Pneumococcal Conjugate-13 09/10/2014   Pneumococcal Polysaccharide-23 10/18/2016    Hypertension This is a chronic problem. The problem is controlled. Pertinent negatives include no chest pain, headaches, palpitations or shortness of breath. Past treatments include ACE inhibitors. Identifiable causes of hypertension include a thyroid problem.  Thyroid Problem Presents for follow-up visit. Patient reports no anxiety, constipation, diarrhea, fatigue, palpitations or tremors. The symptoms have been stable.  Knee Pain  There was no injury mechanism. The pain is present in the right knee. The quality of the pain is described as aching. The pain is moderate. The pain has been Fluctuating since onset.  Associated symptoms include an inability to bear weight. Pertinent negatives include no numbness or tingling. She has tried rest and acetaminophen for the symptoms. The treatment provided mild relief.  Dementia - cognitive impairments appears to be stable, she has supportive family next door.  She has not driving in more than 6 months per her son, but she tells me that she sometimes goes to the grocery.  She tried Namenda but was not taking it correctly so it was stopped.  She does not want a driving evaluation or a neurology referral.   Lab Results  Component Value Date   NA 139 05/21/2022   K 4.1 05/21/2022   CO2 22 05/21/2022   GLUCOSE 85 05/21/2022   BUN 16 05/21/2022   CREATININE 1.29 (H) 05/21/2022   CALCIUM 8.3 (L) 05/21/2022   EGFR 42 (L) 05/21/2022   GFRNONAA 36 (L) 12/25/2019   Lab Results  Component Value Date   CHOL 252 (H) 06/24/2021   HDL 60 06/24/2021   LDLCALC 173 (H) 06/24/2021   TRIG 108 06/24/2021   CHOLHDL 4.2 06/24/2021   Lab Results  Component Value Date   TSH 5.460 (H) 05/21/2022   No results found for: "HGBA1C" Lab Results  Component Value Date   WBC 8.3 06/24/2021   HGB 13.2 06/24/2021   HCT 39.8 06/24/2021   MCV 85 06/24/2021   PLT 395 06/24/2021   Lab Results  Component Value Date   ALT 6 12/25/2021   AST 17 12/25/2021   ALKPHOS 106 12/25/2021   BILITOT 0.3 12/25/2021   No results found for: "25OHVITD2", "25OHVITD3", "VD25OH"   Review of  Systems  Constitutional:  Negative for chills, fatigue and fever.  HENT:  Negative for congestion, hearing loss, trouble swallowing and voice change.   Eyes:  Negative for visual disturbance.  Respiratory:  Negative for cough, chest tightness, shortness of breath and wheezing.   Cardiovascular:  Negative for chest pain, palpitations and leg swelling.  Gastrointestinal:  Negative for abdominal pain, constipation, diarrhea and vomiting.  Endocrine: Negative for polydipsia and polyuria.  Genitourinary:   Negative for dysuria, frequency, genital sores, vaginal bleeding and vaginal discharge.  Musculoskeletal:  Positive for arthralgias and gait problem. Negative for joint swelling.  Skin:  Negative for color change and rash.  Neurological:  Negative for dizziness, tingling, tremors, light-headedness, numbness and headaches.  Hematological:  Negative for adenopathy. Does not bruise/bleed easily.  Psychiatric/Behavioral:  Positive for confusion and decreased concentration. Negative for dysphoric mood and sleep disturbance. The patient is not nervous/anxious.     Patient Active Problem List   Diagnosis Date Noted   B12 deficiency 05/21/2022   Dementia (HCC) 05/21/2022   Arthritis of knee, right 11/03/2017   Chronic renal insufficiency, stage 3 (moderate) (HCC) 04/21/2016   Angiomyolipoma of kidney 01/01/2015   HLD (hyperlipidemia) 10/18/2014   Benign hypertension 10/18/2014   Hypothyroidism, postablative 10/18/2014   Intraductal papilloma of left breast 10/18/2014    Allergies  Allergen Reactions   Atorvastatin Other (See Comments)    Dizziness    Past Surgical History:  Procedure Laterality Date   ABDOMINAL HYSTERECTOMY     APPENDECTOMY     73yr   BREAST BIOPSY Left 10/15/2014   papilloma -u/s bx /clip   BREAST EXCISIONAL BIOPSY Left 04/2015   papilloma removed-neg   HEMORRHOID SURGERY  2014   LAPAROSCOPIC NEPHRECTOMY, HAND ASSISTED Right 04/16/2015   Procedure: HAND ASSISTED LAPAROSCOPIC NEPHRECTOMY;  Surgeon: Hildred Laser, MD;  Location: [facility] ORS;  Service: Urology;  Laterality: Right;   RADICAL HYSTERECTOMY  1969   TOTAL THYROIDECTOMY     age 66    Social History   Tobacco Use   Smoking status: Former    Current packs/day: 0.00    Average packs/day: 0.5 packs/day for 15.0 years (7.5 ttl pk-yrs)    Types: Cigarettes    Start date: 02/18/1964    Quit date: 02/18/1979    Years since quitting: 43.8   Smokeless tobacco: Never   Tobacco comments:    smoking  cessation materials not required  Vaping Use   Vaping status: Never Used  Substance Use Topics   Alcohol use: No   Drug use: No     Medication list has been reviewed and updated.  Current Meds  Medication Sig   ACETAMINOPHEN PO Take by mouth as needed. Tylenol   cyanocobalamin (VITAMIN B12) 500 MCG tablet Take 500 mcg by mouth daily.   levothyroxine (SYNTHROID) 75 MCG tablet TAKE 1 TABLET(75 MCG) BY MOUTH DAILY   liothyronine (CYTOMEL) 5 MCG tablet TAKE 1 TABLET(5 MCG) BY MOUTH DAILY   lisinopril (ZESTRIL) 40 MG tablet TAKE 1 TABLET(40 MG) BY MOUTH DAILY       05/21/2022   11:01 AM 12/25/2021    8:14 AM 06/24/2021    9:30 AM 12/25/2020    9:30 AM  GAD 7 : Generalized Anxiety Score  Nervous, Anxious, on Edge 0 0 0 0  Control/stop worrying 1 0 0 0  Worry too much - different things 1 0 0 0  Trouble relaxing 0 0 0 0  Restless 0 0 0 0  Easily annoyed  or irritable 3 0 0 0  Afraid - awful might happen 0 0 0 0  Total GAD 7 Score 5 0 0 0  Anxiety Difficulty Not difficult at all Not difficult at all Not difficult at all        05/21/2022   11:01 AM 12/25/2021    8:13 AM 06/24/2021    9:29 AM  Depression screen PHQ 2/9  Decreased Interest 2 0 2  Down, Depressed, Hopeless 3 0 2  PHQ - 2 Score 5 0 4  Altered sleeping 0 0 0  Tired, decreased energy 1 0 0  Change in appetite 1 0 1  Feeling bad or failure about yourself  0 0 0  Trouble concentrating 2 0 0  Moving slowly or fidgety/restless 3 0 0  Suicidal thoughts 0 0 0  PHQ-9 Score 12 0 5  Difficult doing work/chores Not difficult at all Not difficult at all Not difficult at all      12/01/2022    9:36 AM 12/11/2021    9:18 AM 06/23/2020   10:50 AM 11/12/2019   11:37 AM 10/25/2018    9:22 AM  6CIT Screen  What Year? 4 points 4 points 0 points 0 points 0 points  What month? 3 points 3 points 0 points 0 points 0 points  What time? 0 points 0 points 0 points 0 points 0 points  Count back from 20 0 points 0 points 0 points 0 points 0  points  Months in reverse 0 points 0 points 0 points 0 points 0 points  Repeat phrase 10 points 10 points 2 points 10 points 4 points  Total Score 17 points 17 points 2 points 10 points 4 points      BP Readings from Last 3 Encounters:  12/01/22 104/68  05/21/22 126/72  12/25/21 136/80    Physical Exam Vitals and nursing note reviewed.  Constitutional:      General: She is not in acute distress.    Appearance: Normal appearance. She is well-developed.  HENT:     Head: Normocephalic and atraumatic.     Right Ear: Tympanic membrane and ear canal normal.     Left Ear: Tympanic membrane and ear canal normal.     Nose:     Right Sinus: No maxillary sinus tenderness.     Left Sinus: No maxillary sinus tenderness.  Eyes:     General: No scleral icterus.       Right eye: No discharge.        Left eye: No discharge.     Conjunctiva/sclera: Conjunctivae normal.  Neck:     Thyroid: No thyromegaly.     Vascular: No carotid bruit.  Cardiovascular:     Rate and Rhythm: Normal rate and regular rhythm.     Pulses: Normal pulses.     Heart sounds: Normal heart sounds.  Pulmonary:     Effort: Pulmonary effort is normal. No respiratory distress.     Breath sounds: No wheezing.  Chest:     Comments: Breast exam declined Abdominal:     General: Bowel sounds are normal.     Palpations: Abdomen is soft.     Tenderness: There is no abdominal tenderness.  Musculoskeletal:     Cervical back: Normal range of motion. No erythema.     Right lower leg: No edema.     Left lower leg: No edema.  Lymphadenopathy:     Cervical: No cervical adenopathy.  Skin:    General: Skin  is warm and dry.     Capillary Refill: Capillary refill takes less than 2 seconds.     Findings: No rash.  Neurological:     Mental Status: She is alert. Mental status is at baseline.     Cranial Nerves: No cranial nerve deficit.     Sensory: No sensory deficit.     Gait: Gait abnormal (due to right knee OA).     Deep  Tendon Reflexes: Reflexes are normal and symmetric.  Psychiatric:        Attention and Perception: Attention normal.        Mood and Affect: Mood normal.        Speech: Speech normal.        Behavior: Behavior normal.        Cognition and Memory: Memory is impaired.     Wt Readings from Last 3 Encounters:  12/01/22 108 lb (49 kg)  05/21/22 111 lb (50.3 kg)  12/25/21 108 lb (49 kg)    BP 104/68   Pulse 92   Ht 5\' 4"  (1.626 m)   Wt 108 lb (49 kg)   SpO2 100%   BMI 18.54 kg/m   Assessment and Plan:  Problem List Items Addressed This Visit       Unprioritized   Hypothyroidism, postablative (Chronic)    Supplemented Lab Results  Component Value Date   TSH 5.460 (H) 05/21/2022         Relevant Orders   TSH + free T4   HLD (hyperlipidemia) (Chronic)    Not currently on medication by patient choice. Recommend healthy diet and exercise      Relevant Orders   Lipid panel   Dementia (HCC)    Tried Namenda but was taking it incorrectly so discontinued by family Continues to gradually worsen but still living alone with close family support No longer driving but very unhappy about it. Discussed Neurology referral Letter written to [facility] so she can get a government ID in place of her DL      Chronic renal insufficiency, stage 3 (moderate) (HCC) (Chronic)    Monitoring regularly; avoid NSAIDS      Relevant Orders   Comprehensive metabolic panel   VITAMIN D 25 Hydroxy (Vit-D Deficiency, Fractures)   Phosphorus   Benign hypertension (Chronic)    Normal exam with stable BP on lisinopril. No concerns or side effects to current medication. No change in regimen; continue low sodium diet.       Relevant Orders   CBC with Differential/Platelet   Comprehensive metabolic panel   X32 deficiency    On daily supplement      Relevant Orders   Vitamin B12   Arthritis of knee, right    Persistent right knee OA and pain limiting activities Recommend Tylenol  scheduled  dosing       Other Visit Diagnoses     Annual physical exam    -  Primary   aged out of mammogram and CRC screening declines further vaccinations but will continue annual flu vaccine   Age-related osteoporosis without current pathological fracture           Return in about 6 months (around 06/03/2023) for HTN.    Reubin Milan, MD [facility] [place]

## 2022-12-02 LAB — CBC WITH DIFFERENTIAL/PLATELET
Basophils Absolute: 0.1 10*3/uL (ref 0.0–0.2)
Basos: 1 %
EOS (ABSOLUTE): 0.1 10*3/uL (ref 0.0–0.4)
Eos: 2 %
Hematocrit: 37.7 % (ref 34.0–46.6)
Hemoglobin: 12.4 g/dL (ref 11.1–15.9)
Immature Grans (Abs): 0 10*3/uL (ref 0.0–0.1)
Immature Granulocytes: 0 %
Lymphocytes Absolute: 0.5 10*3/uL — ABNORMAL LOW (ref 0.7–3.1)
Lymphs: 7 %
MCH: 27.9 pg (ref 26.6–33.0)
MCHC: 32.9 g/dL (ref 31.5–35.7)
MCV: 85 fL (ref 79–97)
Monocytes Absolute: 0.6 10*3/uL (ref 0.1–0.9)
Monocytes: 7 %
Neutrophils Absolute: 6.4 10*3/uL (ref 1.4–7.0)
Neutrophils: 83 %
Platelets: 363 10*3/uL (ref 150–450)
RBC: 4.45 x10E6/uL (ref 3.77–5.28)
RDW: 13.3 % (ref 11.7–15.4)
WBC: 7.6 10*3/uL (ref 3.4–10.8)

## 2022-12-02 LAB — TSH+FREE T4
Free T4: 1.53 ng/dL (ref 0.82–1.77)
TSH: 0.978 u[IU]/mL (ref 0.450–4.500)

## 2022-12-02 LAB — COMPREHENSIVE METABOLIC PANEL
ALT: 4 IU/L (ref 0–32)
AST: 9 IU/L (ref 0–40)
Albumin: 3.8 g/dL (ref 3.8–4.8)
Alkaline Phosphatase: 118 IU/L (ref 44–121)
BUN/Creatinine Ratio: 15 (ref 12–28)
BUN: 22 mg/dL (ref 8–27)
Bilirubin Total: 0.2 mg/dL (ref 0.0–1.2)
CO2: 20 mmol/L (ref 20–29)
Calcium: 8.9 mg/dL (ref 8.7–10.3)
Chloride: 103 mmol/L (ref 96–106)
Creatinine, Ser: 1.48 mg/dL — ABNORMAL HIGH (ref 0.57–1.00)
Globulin, Total: 2.8 g/dL (ref 1.5–4.5)
Glucose: 151 mg/dL — ABNORMAL HIGH (ref 70–99)
Potassium: 4.7 mmol/L (ref 3.5–5.2)
Sodium: 140 mmol/L (ref 134–144)
Total Protein: 6.6 g/dL (ref 6.0–8.5)
eGFR: 36 mL/min/{1.73_m2} — ABNORMAL LOW (ref >59)

## 2022-12-02 LAB — PHOSPHORUS: Phosphorus: 4.7 mg/dL — ABNORMAL HIGH (ref 3.0–4.3)

## 2022-12-02 LAB — LIPID PANEL
Chol/HDL Ratio: 4.8 ratio — ABNORMAL HIGH (ref 0.0–4.4)
Cholesterol, Total: 241 mg/dL — ABNORMAL HIGH (ref 100–199)
HDL: 50 mg/dL (ref >39)
LDL Chol Calc (NIH): 158 mg/dL — ABNORMAL HIGH (ref 0–99)
Triglycerides: 180 mg/dL — ABNORMAL HIGH (ref 0–149)
VLDL Cholesterol Cal: 33 mg/dL (ref 5–40)

## 2022-12-02 LAB — VITAMIN D 25 HYDROXY (VIT D DEFICIENCY, FRACTURES): Vit D, 25-Hydroxy: 8.5 ng/mL — ABNORMAL LOW (ref 30.0–100.0)

## 2022-12-02 LAB — VITAMIN B12: Vitamin B-12: 370 pg/mL (ref 232–1245)

## 2022-12-30 ENCOUNTER — Other Ambulatory Visit: Payer: Self-pay | Admitting: Internal Medicine

## 2022-12-30 DIAGNOSIS — E890 Postprocedural hypothyroidism: Secondary | ICD-10-CM

## 2023-04-03 DIAGNOSIS — N189 Chronic kidney disease, unspecified: Secondary | ICD-10-CM | POA: Diagnosis not present

## 2023-04-03 DIAGNOSIS — D1771 Benign lipomatous neoplasm of kidney: Secondary | ICD-10-CM | POA: Diagnosis not present

## 2023-04-03 DIAGNOSIS — I959 Hypotension, unspecified: Secondary | ICD-10-CM | POA: Diagnosis not present

## 2023-04-03 DIAGNOSIS — R52 Pain, unspecified: Secondary | ICD-10-CM | POA: Diagnosis not present

## 2023-04-03 DIAGNOSIS — E46 Unspecified protein-calorie malnutrition: Secondary | ICD-10-CM | POA: Diagnosis not present

## 2023-04-03 DIAGNOSIS — W19XXXA Unspecified fall, initial encounter: Secondary | ICD-10-CM | POA: Diagnosis not present

## 2023-04-03 DIAGNOSIS — R29707 NIHSS score 7: Secondary | ICD-10-CM | POA: Diagnosis not present

## 2023-04-03 DIAGNOSIS — R413 Other amnesia: Secondary | ICD-10-CM | POA: Diagnosis not present

## 2023-04-03 DIAGNOSIS — I69320 Aphasia following cerebral infarction: Secondary | ICD-10-CM | POA: Diagnosis not present

## 2023-04-03 DIAGNOSIS — I129 Hypertensive chronic kidney disease with stage 1 through stage 4 chronic kidney disease, or unspecified chronic kidney disease: Secondary | ICD-10-CM | POA: Diagnosis not present

## 2023-04-03 DIAGNOSIS — I63311 Cerebral infarction due to thrombosis of right middle cerebral artery: Secondary | ICD-10-CM | POA: Diagnosis not present

## 2023-04-03 DIAGNOSIS — I499 Cardiac arrhythmia, unspecified: Secondary | ICD-10-CM | POA: Diagnosis not present

## 2023-04-03 DIAGNOSIS — I63511 Cerebral infarction due to unspecified occlusion or stenosis of right middle cerebral artery: Secondary | ICD-10-CM | POA: Diagnosis not present

## 2023-04-03 DIAGNOSIS — R531 Weakness: Secondary | ICD-10-CM | POA: Diagnosis not present

## 2023-04-03 DIAGNOSIS — S51812D Laceration without foreign body of left forearm, subsequent encounter: Secondary | ICD-10-CM | POA: Diagnosis not present

## 2023-04-03 DIAGNOSIS — Z8673 Personal history of transient ischemic attack (TIA), and cerebral infarction without residual deficits: Secondary | ICD-10-CM | POA: Diagnosis not present

## 2023-04-03 DIAGNOSIS — Z7982 Long term (current) use of aspirin: Secondary | ICD-10-CM | POA: Diagnosis not present

## 2023-04-03 DIAGNOSIS — M6281 Muscle weakness (generalized): Secondary | ICD-10-CM | POA: Diagnosis not present

## 2023-04-03 DIAGNOSIS — I10 Essential (primary) hypertension: Secondary | ICD-10-CM | POA: Diagnosis not present

## 2023-04-03 DIAGNOSIS — E785 Hyperlipidemia, unspecified: Secondary | ICD-10-CM | POA: Diagnosis not present

## 2023-04-03 DIAGNOSIS — I639 Cerebral infarction, unspecified: Secondary | ICD-10-CM | POA: Diagnosis not present

## 2023-04-03 DIAGNOSIS — S51012D Laceration without foreign body of left elbow, subsequent encounter: Secondary | ICD-10-CM | POA: Diagnosis not present

## 2023-04-03 DIAGNOSIS — R279 Unspecified lack of coordination: Secondary | ICD-10-CM | POA: Diagnosis not present

## 2023-04-03 DIAGNOSIS — R2681 Unsteadiness on feet: Secondary | ICD-10-CM | POA: Diagnosis not present

## 2023-04-03 DIAGNOSIS — G4700 Insomnia, unspecified: Secondary | ICD-10-CM | POA: Diagnosis not present

## 2023-04-03 DIAGNOSIS — R229 Localized swelling, mass and lump, unspecified: Secondary | ICD-10-CM | POA: Diagnosis not present

## 2023-04-03 DIAGNOSIS — N179 Acute kidney failure, unspecified: Secondary | ICD-10-CM | POA: Diagnosis not present

## 2023-04-03 DIAGNOSIS — E039 Hypothyroidism, unspecified: Secondary | ICD-10-CM | POA: Diagnosis not present

## 2023-04-03 DIAGNOSIS — Z905 Acquired absence of kidney: Secondary | ICD-10-CM | POA: Diagnosis not present

## 2023-04-03 DIAGNOSIS — I6389 Other cerebral infarction: Secondary | ICD-10-CM | POA: Diagnosis not present

## 2023-04-03 DIAGNOSIS — Z7409 Other reduced mobility: Secondary | ICD-10-CM | POA: Diagnosis not present

## 2023-04-03 DIAGNOSIS — Z681 Body mass index (BMI) 19 or less, adult: Secondary | ICD-10-CM | POA: Diagnosis not present

## 2023-04-03 DIAGNOSIS — I6782 Cerebral ischemia: Secondary | ICD-10-CM | POA: Diagnosis not present

## 2023-04-03 DIAGNOSIS — E782 Mixed hyperlipidemia: Secondary | ICD-10-CM | POA: Diagnosis not present

## 2023-04-03 DIAGNOSIS — R5381 Other malaise: Secondary | ICD-10-CM | POA: Diagnosis not present

## 2023-04-03 DIAGNOSIS — I6529 Occlusion and stenosis of unspecified carotid artery: Secondary | ICD-10-CM | POA: Diagnosis not present

## 2023-04-03 DIAGNOSIS — I69318 Other symptoms and signs involving cognitive functions following cerebral infarction: Secondary | ICD-10-CM | POA: Diagnosis not present

## 2023-04-03 DIAGNOSIS — R29810 Facial weakness: Secondary | ICD-10-CM | POA: Diagnosis not present

## 2023-04-04 DIAGNOSIS — I639 Cerebral infarction, unspecified: Secondary | ICD-10-CM | POA: Diagnosis not present

## 2023-04-04 DIAGNOSIS — I63511 Cerebral infarction due to unspecified occlusion or stenosis of right middle cerebral artery: Secondary | ICD-10-CM | POA: Diagnosis not present

## 2023-04-04 DIAGNOSIS — I63311 Cerebral infarction due to thrombosis of right middle cerebral artery: Secondary | ICD-10-CM | POA: Diagnosis not present

## 2023-04-04 DIAGNOSIS — R531 Weakness: Secondary | ICD-10-CM | POA: Diagnosis not present

## 2023-04-05 DIAGNOSIS — I63511 Cerebral infarction due to unspecified occlusion or stenosis of right middle cerebral artery: Secondary | ICD-10-CM | POA: Diagnosis not present

## 2023-04-06 DIAGNOSIS — I639 Cerebral infarction, unspecified: Secondary | ICD-10-CM | POA: Diagnosis not present

## 2023-04-07 DIAGNOSIS — I639 Cerebral infarction, unspecified: Secondary | ICD-10-CM | POA: Diagnosis not present

## 2023-04-13 DIAGNOSIS — I63511 Cerebral infarction due to unspecified occlusion or stenosis of right middle cerebral artery: Secondary | ICD-10-CM | POA: Diagnosis not present

## 2023-04-15 DIAGNOSIS — F01B11 Vascular dementia, moderate, with agitation: Secondary | ICD-10-CM | POA: Insufficient documentation

## 2023-04-22 DIAGNOSIS — I63511 Cerebral infarction due to unspecified occlusion or stenosis of right middle cerebral artery: Secondary | ICD-10-CM | POA: Diagnosis not present

## 2023-04-22 DIAGNOSIS — I639 Cerebral infarction, unspecified: Secondary | ICD-10-CM | POA: Insufficient documentation

## 2023-04-24 DIAGNOSIS — I63511 Cerebral infarction due to unspecified occlusion or stenosis of right middle cerebral artery: Secondary | ICD-10-CM | POA: Diagnosis not present

## 2023-04-25 DIAGNOSIS — S52502A Unspecified fracture of the lower end of left radius, initial encounter for closed fracture: Secondary | ICD-10-CM | POA: Diagnosis not present

## 2023-04-25 DIAGNOSIS — S62102D Fracture of unspecified carpal bone, left wrist, subsequent encounter for fracture with routine healing: Secondary | ICD-10-CM | POA: Diagnosis not present

## 2023-04-25 DIAGNOSIS — I69998 Other sequelae following unspecified cerebrovascular disease: Secondary | ICD-10-CM | POA: Diagnosis not present

## 2023-04-25 DIAGNOSIS — E039 Hypothyroidism, unspecified: Secondary | ICD-10-CM | POA: Diagnosis not present

## 2023-04-25 DIAGNOSIS — G4700 Insomnia, unspecified: Secondary | ICD-10-CM | POA: Diagnosis not present

## 2023-04-25 DIAGNOSIS — R451 Restlessness and agitation: Secondary | ICD-10-CM | POA: Diagnosis not present

## 2023-04-25 DIAGNOSIS — R52 Pain, unspecified: Secondary | ICD-10-CM | POA: Diagnosis not present

## 2023-04-25 DIAGNOSIS — E782 Mixed hyperlipidemia: Secondary | ICD-10-CM | POA: Diagnosis not present

## 2023-04-25 DIAGNOSIS — S51012D Laceration without foreign body of left elbow, subsequent encounter: Secondary | ICD-10-CM | POA: Diagnosis not present

## 2023-04-25 DIAGNOSIS — E875 Hyperkalemia: Secondary | ICD-10-CM | POA: Diagnosis not present

## 2023-04-25 DIAGNOSIS — D1771 Benign lipomatous neoplasm of kidney: Secondary | ICD-10-CM | POA: Diagnosis not present

## 2023-04-25 DIAGNOSIS — I10 Essential (primary) hypertension: Secondary | ICD-10-CM | POA: Diagnosis not present

## 2023-04-25 DIAGNOSIS — I6931 Attention and concentration deficit following cerebral infarction: Secondary | ICD-10-CM | POA: Diagnosis not present

## 2023-04-25 DIAGNOSIS — S0083XA Contusion of other part of head, initial encounter: Secondary | ICD-10-CM | POA: Diagnosis not present

## 2023-04-25 DIAGNOSIS — R229 Localized swelling, mass and lump, unspecified: Secondary | ICD-10-CM | POA: Diagnosis not present

## 2023-04-25 DIAGNOSIS — R2681 Unsteadiness on feet: Secondary | ICD-10-CM | POA: Diagnosis not present

## 2023-04-25 DIAGNOSIS — I69320 Aphasia following cerebral infarction: Secondary | ICD-10-CM | POA: Diagnosis not present

## 2023-04-25 DIAGNOSIS — R040 Epistaxis: Secondary | ICD-10-CM | POA: Diagnosis not present

## 2023-04-25 DIAGNOSIS — M25532 Pain in left wrist: Secondary | ICD-10-CM | POA: Diagnosis not present

## 2023-04-25 DIAGNOSIS — I639 Cerebral infarction, unspecified: Secondary | ICD-10-CM | POA: Diagnosis not present

## 2023-04-25 DIAGNOSIS — M25561 Pain in right knee: Secondary | ICD-10-CM | POA: Diagnosis not present

## 2023-04-25 DIAGNOSIS — M25522 Pain in left elbow: Secondary | ICD-10-CM | POA: Diagnosis not present

## 2023-04-25 DIAGNOSIS — S6292XA Unspecified fracture of left wrist and hand, initial encounter for closed fracture: Secondary | ICD-10-CM | POA: Diagnosis not present

## 2023-04-25 DIAGNOSIS — D72829 Elevated white blood cell count, unspecified: Secondary | ICD-10-CM | POA: Diagnosis not present

## 2023-04-25 DIAGNOSIS — I6782 Cerebral ischemia: Secondary | ICD-10-CM | POA: Diagnosis not present

## 2023-04-25 DIAGNOSIS — S51812D Laceration without foreign body of left forearm, subsequent encounter: Secondary | ICD-10-CM | POA: Diagnosis not present

## 2023-04-25 DIAGNOSIS — R279 Unspecified lack of coordination: Secondary | ICD-10-CM | POA: Diagnosis not present

## 2023-04-25 DIAGNOSIS — M6281 Muscle weakness (generalized): Secondary | ICD-10-CM | POA: Diagnosis not present

## 2023-04-25 DIAGNOSIS — Z8673 Personal history of transient ischemic attack (TIA), and cerebral infarction without residual deficits: Secondary | ICD-10-CM | POA: Diagnosis not present

## 2023-04-25 DIAGNOSIS — Z7982 Long term (current) use of aspirin: Secondary | ICD-10-CM | POA: Diagnosis not present

## 2023-04-25 DIAGNOSIS — W19XXXA Unspecified fall, initial encounter: Secondary | ICD-10-CM | POA: Diagnosis not present

## 2023-04-25 DIAGNOSIS — M25552 Pain in left hip: Secondary | ICD-10-CM | POA: Diagnosis not present

## 2023-04-25 DIAGNOSIS — R5381 Other malaise: Secondary | ICD-10-CM | POA: Diagnosis not present

## 2023-04-26 DIAGNOSIS — R5381 Other malaise: Secondary | ICD-10-CM | POA: Diagnosis not present

## 2023-04-26 DIAGNOSIS — E039 Hypothyroidism, unspecified: Secondary | ICD-10-CM | POA: Diagnosis not present

## 2023-04-26 DIAGNOSIS — G4700 Insomnia, unspecified: Secondary | ICD-10-CM | POA: Diagnosis not present

## 2023-04-26 DIAGNOSIS — I10 Essential (primary) hypertension: Secondary | ICD-10-CM | POA: Diagnosis not present

## 2023-04-26 DIAGNOSIS — Z7982 Long term (current) use of aspirin: Secondary | ICD-10-CM | POA: Diagnosis not present

## 2023-04-26 DIAGNOSIS — E782 Mixed hyperlipidemia: Secondary | ICD-10-CM | POA: Diagnosis not present

## 2023-04-26 DIAGNOSIS — S62102D Fracture of unspecified carpal bone, left wrist, subsequent encounter for fracture with routine healing: Secondary | ICD-10-CM | POA: Diagnosis not present

## 2023-04-26 DIAGNOSIS — Z8673 Personal history of transient ischemic attack (TIA), and cerebral infarction without residual deficits: Secondary | ICD-10-CM | POA: Diagnosis not present

## 2023-04-26 DIAGNOSIS — S0083XA Contusion of other part of head, initial encounter: Secondary | ICD-10-CM | POA: Diagnosis not present

## 2023-04-28 DIAGNOSIS — D72829 Elevated white blood cell count, unspecified: Secondary | ICD-10-CM | POA: Diagnosis not present

## 2023-04-28 DIAGNOSIS — S52502A Unspecified fracture of the lower end of left radius, initial encounter for closed fracture: Secondary | ICD-10-CM | POA: Diagnosis not present

## 2023-04-28 DIAGNOSIS — E875 Hyperkalemia: Secondary | ICD-10-CM | POA: Diagnosis not present

## 2023-04-28 DIAGNOSIS — S62102D Fracture of unspecified carpal bone, left wrist, subsequent encounter for fracture with routine healing: Secondary | ICD-10-CM | POA: Diagnosis not present

## 2023-04-29 DIAGNOSIS — I69998 Other sequelae following unspecified cerebrovascular disease: Secondary | ICD-10-CM | POA: Diagnosis not present

## 2023-04-29 DIAGNOSIS — R451 Restlessness and agitation: Secondary | ICD-10-CM | POA: Diagnosis not present

## 2023-05-02 DIAGNOSIS — R040 Epistaxis: Secondary | ICD-10-CM | POA: Diagnosis not present

## 2023-05-02 DIAGNOSIS — R5381 Other malaise: Secondary | ICD-10-CM | POA: Diagnosis not present

## 2023-05-02 DIAGNOSIS — I10 Essential (primary) hypertension: Secondary | ICD-10-CM | POA: Diagnosis not present

## 2023-05-02 DIAGNOSIS — D72829 Elevated white blood cell count, unspecified: Secondary | ICD-10-CM | POA: Diagnosis not present

## 2023-05-02 DIAGNOSIS — E875 Hyperkalemia: Secondary | ICD-10-CM | POA: Diagnosis not present

## 2023-05-02 DIAGNOSIS — S62102D Fracture of unspecified carpal bone, left wrist, subsequent encounter for fracture with routine healing: Secondary | ICD-10-CM | POA: Diagnosis not present

## 2023-05-03 DIAGNOSIS — M25552 Pain in left hip: Secondary | ICD-10-CM | POA: Diagnosis not present

## 2023-05-06 DIAGNOSIS — Z8673 Personal history of transient ischemic attack (TIA), and cerebral infarction without residual deficits: Secondary | ICD-10-CM | POA: Diagnosis not present

## 2023-05-06 DIAGNOSIS — S52502A Unspecified fracture of the lower end of left radius, initial encounter for closed fracture: Secondary | ICD-10-CM | POA: Diagnosis not present

## 2023-05-06 DIAGNOSIS — I10 Essential (primary) hypertension: Secondary | ICD-10-CM | POA: Diagnosis not present

## 2023-05-06 DIAGNOSIS — E875 Hyperkalemia: Secondary | ICD-10-CM | POA: Diagnosis not present

## 2023-05-06 DIAGNOSIS — S62102D Fracture of unspecified carpal bone, left wrist, subsequent encounter for fracture with routine healing: Secondary | ICD-10-CM | POA: Diagnosis not present

## 2023-05-09 ENCOUNTER — Telehealth: Payer: Self-pay | Admitting: Internal Medicine

## 2023-05-09 NOTE — Telephone Encounter (Signed)
Pt is being moved to [facility] today for a stroke and brain issues.  She will not be doing the Medicare Wellness visit.

## 2023-05-09 NOTE — Telephone Encounter (Signed)
Copied from CRM [id number]. Topic: Medicare AWV >> May 09, 2023 10:23 AM Payton Doughty wrote: Reason for CRM: Called LVM 05/09/2023 to schedule AWV. Please schedule Virtual or Telehealth visits ONLY.   Verlee Rossetti; Care Guide Ambulatory Clinical Support [place] [facility] Direct Dial: [phone]

## 2023-05-11 DIAGNOSIS — G4700 Insomnia, unspecified: Secondary | ICD-10-CM | POA: Diagnosis not present

## 2023-05-11 DIAGNOSIS — R296 Repeated falls: Secondary | ICD-10-CM | POA: Diagnosis not present

## 2023-05-11 DIAGNOSIS — S62102D Fracture of unspecified carpal bone, left wrist, subsequent encounter for fracture with routine healing: Secondary | ICD-10-CM | POA: Diagnosis not present

## 2023-05-11 DIAGNOSIS — E782 Mixed hyperlipidemia: Secondary | ICD-10-CM | POA: Diagnosis not present

## 2023-05-11 DIAGNOSIS — Z7982 Long term (current) use of aspirin: Secondary | ICD-10-CM | POA: Diagnosis not present

## 2023-05-11 DIAGNOSIS — I69354 Hemiplegia and hemiparesis following cerebral infarction affecting left non-dominant side: Secondary | ICD-10-CM | POA: Diagnosis not present

## 2023-05-11 DIAGNOSIS — I10 Essential (primary) hypertension: Secondary | ICD-10-CM | POA: Diagnosis not present

## 2023-05-11 DIAGNOSIS — Z9181 History of falling: Secondary | ICD-10-CM | POA: Diagnosis not present

## 2023-05-11 DIAGNOSIS — E039 Hypothyroidism, unspecified: Secondary | ICD-10-CM | POA: Diagnosis not present

## 2023-05-11 DIAGNOSIS — Z905 Acquired absence of kidney: Secondary | ICD-10-CM | POA: Diagnosis not present

## 2023-05-11 DIAGNOSIS — M1712 Unilateral primary osteoarthritis, left knee: Secondary | ICD-10-CM | POA: Diagnosis not present

## 2023-05-16 DIAGNOSIS — G4700 Insomnia, unspecified: Secondary | ICD-10-CM | POA: Diagnosis not present

## 2023-05-16 DIAGNOSIS — I10 Essential (primary) hypertension: Secondary | ICD-10-CM | POA: Diagnosis not present

## 2023-05-16 DIAGNOSIS — E782 Mixed hyperlipidemia: Secondary | ICD-10-CM | POA: Diagnosis not present

## 2023-05-16 DIAGNOSIS — Z9181 History of falling: Secondary | ICD-10-CM | POA: Diagnosis not present

## 2023-05-16 DIAGNOSIS — I69354 Hemiplegia and hemiparesis following cerebral infarction affecting left non-dominant side: Secondary | ICD-10-CM | POA: Diagnosis not present

## 2023-05-16 DIAGNOSIS — Z905 Acquired absence of kidney: Secondary | ICD-10-CM | POA: Diagnosis not present

## 2023-05-16 DIAGNOSIS — R296 Repeated falls: Secondary | ICD-10-CM | POA: Diagnosis not present

## 2023-05-16 DIAGNOSIS — Z7982 Long term (current) use of aspirin: Secondary | ICD-10-CM | POA: Diagnosis not present

## 2023-05-16 DIAGNOSIS — S62102D Fracture of unspecified carpal bone, left wrist, subsequent encounter for fracture with routine healing: Secondary | ICD-10-CM | POA: Diagnosis not present

## 2023-05-16 DIAGNOSIS — E039 Hypothyroidism, unspecified: Secondary | ICD-10-CM | POA: Diagnosis not present

## 2023-05-16 DIAGNOSIS — M1712 Unilateral primary osteoarthritis, left knee: Secondary | ICD-10-CM | POA: Diagnosis not present

## 2023-05-19 DIAGNOSIS — R2681 Unsteadiness on feet: Secondary | ICD-10-CM | POA: Diagnosis not present

## 2023-05-19 DIAGNOSIS — E785 Hyperlipidemia, unspecified: Secondary | ICD-10-CM | POA: Diagnosis not present

## 2023-05-19 DIAGNOSIS — S42302S Unspecified fracture of shaft of humerus, left arm, sequela: Secondary | ICD-10-CM | POA: Diagnosis not present

## 2023-05-19 DIAGNOSIS — E039 Hypothyroidism, unspecified: Secondary | ICD-10-CM | POA: Diagnosis not present

## 2023-05-19 DIAGNOSIS — Z8673 Personal history of transient ischemic attack (TIA), and cerebral infarction without residual deficits: Secondary | ICD-10-CM | POA: Diagnosis not present

## 2023-05-19 DIAGNOSIS — I10 Essential (primary) hypertension: Secondary | ICD-10-CM | POA: Diagnosis not present

## 2023-05-23 DIAGNOSIS — Z7982 Long term (current) use of aspirin: Secondary | ICD-10-CM | POA: Diagnosis not present

## 2023-05-23 DIAGNOSIS — E039 Hypothyroidism, unspecified: Secondary | ICD-10-CM | POA: Diagnosis not present

## 2023-05-23 DIAGNOSIS — I69354 Hemiplegia and hemiparesis following cerebral infarction affecting left non-dominant side: Secondary | ICD-10-CM | POA: Diagnosis not present

## 2023-05-23 DIAGNOSIS — E782 Mixed hyperlipidemia: Secondary | ICD-10-CM | POA: Diagnosis not present

## 2023-05-23 DIAGNOSIS — D519 Vitamin B12 deficiency anemia, unspecified: Secondary | ICD-10-CM | POA: Diagnosis not present

## 2023-05-23 DIAGNOSIS — Z9181 History of falling: Secondary | ICD-10-CM | POA: Diagnosis not present

## 2023-05-23 DIAGNOSIS — M1712 Unilateral primary osteoarthritis, left knee: Secondary | ICD-10-CM | POA: Diagnosis not present

## 2023-05-23 DIAGNOSIS — I10 Essential (primary) hypertension: Secondary | ICD-10-CM | POA: Diagnosis not present

## 2023-05-23 DIAGNOSIS — G4700 Insomnia, unspecified: Secondary | ICD-10-CM | POA: Diagnosis not present

## 2023-05-23 DIAGNOSIS — S62102D Fracture of unspecified carpal bone, left wrist, subsequent encounter for fracture with routine healing: Secondary | ICD-10-CM | POA: Diagnosis not present

## 2023-05-23 DIAGNOSIS — Z79899 Other long term (current) drug therapy: Secondary | ICD-10-CM | POA: Diagnosis not present

## 2023-05-23 DIAGNOSIS — R296 Repeated falls: Secondary | ICD-10-CM | POA: Diagnosis not present

## 2023-05-23 DIAGNOSIS — Z905 Acquired absence of kidney: Secondary | ICD-10-CM | POA: Diagnosis not present

## 2023-05-23 DIAGNOSIS — D559 Anemia due to enzyme disorder, unspecified: Secondary | ICD-10-CM | POA: Diagnosis not present

## 2023-05-30 DIAGNOSIS — S62102D Fracture of unspecified carpal bone, left wrist, subsequent encounter for fracture with routine healing: Secondary | ICD-10-CM | POA: Diagnosis not present

## 2023-05-30 DIAGNOSIS — R296 Repeated falls: Secondary | ICD-10-CM | POA: Diagnosis not present

## 2023-05-30 DIAGNOSIS — G4700 Insomnia, unspecified: Secondary | ICD-10-CM | POA: Diagnosis not present

## 2023-05-30 DIAGNOSIS — I10 Essential (primary) hypertension: Secondary | ICD-10-CM | POA: Diagnosis not present

## 2023-05-30 DIAGNOSIS — M1712 Unilateral primary osteoarthritis, left knee: Secondary | ICD-10-CM | POA: Diagnosis not present

## 2023-05-30 DIAGNOSIS — I69354 Hemiplegia and hemiparesis following cerebral infarction affecting left non-dominant side: Secondary | ICD-10-CM | POA: Diagnosis not present

## 2023-05-30 DIAGNOSIS — Z905 Acquired absence of kidney: Secondary | ICD-10-CM | POA: Diagnosis not present

## 2023-05-30 DIAGNOSIS — Z9181 History of falling: Secondary | ICD-10-CM | POA: Diagnosis not present

## 2023-05-30 DIAGNOSIS — E039 Hypothyroidism, unspecified: Secondary | ICD-10-CM | POA: Diagnosis not present

## 2023-05-30 DIAGNOSIS — Z7982 Long term (current) use of aspirin: Secondary | ICD-10-CM | POA: Diagnosis not present

## 2023-05-30 DIAGNOSIS — E782 Mixed hyperlipidemia: Secondary | ICD-10-CM | POA: Diagnosis not present

## 2023-05-31 DIAGNOSIS — S52502A Unspecified fracture of the lower end of left radius, initial encounter for closed fracture: Secondary | ICD-10-CM | POA: Diagnosis not present

## 2023-06-01 ENCOUNTER — Other Ambulatory Visit (INDEPENDENT_AMBULATORY_CARE_PROVIDER_SITE_OTHER): Payer: Medicare Other | Admitting: Internal Medicine

## 2023-06-01 DIAGNOSIS — E782 Mixed hyperlipidemia: Secondary | ICD-10-CM

## 2023-06-01 DIAGNOSIS — G4700 Insomnia, unspecified: Secondary | ICD-10-CM

## 2023-06-01 DIAGNOSIS — I69354 Hemiplegia and hemiparesis following cerebral infarction affecting left non-dominant side: Secondary | ICD-10-CM | POA: Diagnosis not present

## 2023-06-01 DIAGNOSIS — Z905 Acquired absence of kidney: Secondary | ICD-10-CM | POA: Diagnosis not present

## 2023-06-01 DIAGNOSIS — Z7982 Long term (current) use of aspirin: Secondary | ICD-10-CM | POA: Diagnosis not present

## 2023-06-01 DIAGNOSIS — R296 Repeated falls: Secondary | ICD-10-CM

## 2023-06-01 DIAGNOSIS — I10 Essential (primary) hypertension: Secondary | ICD-10-CM

## 2023-06-01 DIAGNOSIS — M1712 Unilateral primary osteoarthritis, left knee: Secondary | ICD-10-CM | POA: Diagnosis not present

## 2023-06-01 DIAGNOSIS — E890 Postprocedural hypothyroidism: Secondary | ICD-10-CM

## 2023-06-01 DIAGNOSIS — F01B11 Vascular dementia, moderate, with agitation: Secondary | ICD-10-CM

## 2023-06-01 DIAGNOSIS — S62102D Fracture of unspecified carpal bone, left wrist, subsequent encounter for fracture with routine healing: Secondary | ICD-10-CM

## 2023-06-01 DIAGNOSIS — E039 Hypothyroidism, unspecified: Secondary | ICD-10-CM | POA: Diagnosis not present

## 2023-06-01 DIAGNOSIS — Z9181 History of falling: Secondary | ICD-10-CM | POA: Diagnosis not present

## 2023-06-01 DIAGNOSIS — N183 Chronic kidney disease, stage 3 unspecified: Secondary | ICD-10-CM

## 2023-06-01 NOTE — Progress Notes (Signed)
Received home health orders orders from [facility]. Start of care 05/11/23.   Certification and orders from 05/11/23 through 07/09/23 are reviewed, signed and faxed back to home health company.  Need of intermittent skilled services at home: home bound  The home health care plan has been established by me and will be reviewed and updated as needed to maximize patient recovery.  I certify that all home health services have been and will be furnished to the patient while under my care.  Face-to-face encounter in which the need for home health services was established: at hospital discharge 04/25/23.  Patient is receiving home health services for the following diagnoses: Problem List Items Addressed This Visit       Unprioritized   HLD (hyperlipidemia) (Chronic)   Benign hypertension (Chronic)   Hypothyroidism, postablative (Chronic)   Chronic renal insufficiency, stage 3 (moderate) (HCC) (Chronic)   Moderate vascular dementia with agitation (HCC) - Primary   Long term current use of aspirin   Acquired absence of kidney   Fracture of left carpal bone with routine healing   Falls frequently   Persistent disorder of initiating or maintaining sleep     Bari Edward, MD

## 2023-06-02 DIAGNOSIS — E7800 Pure hypercholesterolemia, unspecified: Secondary | ICD-10-CM | POA: Diagnosis not present

## 2023-06-02 DIAGNOSIS — E039 Hypothyroidism, unspecified: Secondary | ICD-10-CM | POA: Diagnosis not present

## 2023-06-02 DIAGNOSIS — D649 Anemia, unspecified: Secondary | ICD-10-CM | POA: Diagnosis not present

## 2023-06-02 DIAGNOSIS — N1832 Chronic kidney disease, stage 3b: Secondary | ICD-10-CM | POA: Diagnosis not present

## 2023-06-02 DIAGNOSIS — E539 Vitamin B deficiency, unspecified: Secondary | ICD-10-CM | POA: Diagnosis not present

## 2023-06-03 ENCOUNTER — Ambulatory Visit: Payer: Medicare Other | Admitting: Internal Medicine

## 2023-06-06 DIAGNOSIS — E039 Hypothyroidism, unspecified: Secondary | ICD-10-CM | POA: Diagnosis not present

## 2023-06-06 DIAGNOSIS — N182 Chronic kidney disease, stage 2 (mild): Secondary | ICD-10-CM | POA: Diagnosis not present

## 2023-06-06 DIAGNOSIS — I10 Essential (primary) hypertension: Secondary | ICD-10-CM | POA: Diagnosis not present

## 2023-06-08 DIAGNOSIS — E782 Mixed hyperlipidemia: Secondary | ICD-10-CM | POA: Diagnosis not present

## 2023-06-08 DIAGNOSIS — M1712 Unilateral primary osteoarthritis, left knee: Secondary | ICD-10-CM | POA: Diagnosis not present

## 2023-06-08 DIAGNOSIS — I10 Essential (primary) hypertension: Secondary | ICD-10-CM | POA: Diagnosis not present

## 2023-06-08 DIAGNOSIS — R296 Repeated falls: Secondary | ICD-10-CM | POA: Diagnosis not present

## 2023-06-08 DIAGNOSIS — Z7982 Long term (current) use of aspirin: Secondary | ICD-10-CM | POA: Diagnosis not present

## 2023-06-08 DIAGNOSIS — E039 Hypothyroidism, unspecified: Secondary | ICD-10-CM | POA: Diagnosis not present

## 2023-06-08 DIAGNOSIS — Z9181 History of falling: Secondary | ICD-10-CM | POA: Diagnosis not present

## 2023-06-08 DIAGNOSIS — Z905 Acquired absence of kidney: Secondary | ICD-10-CM | POA: Diagnosis not present

## 2023-06-08 DIAGNOSIS — S62102D Fracture of unspecified carpal bone, left wrist, subsequent encounter for fracture with routine healing: Secondary | ICD-10-CM | POA: Diagnosis not present

## 2023-06-08 DIAGNOSIS — I69354 Hemiplegia and hemiparesis following cerebral infarction affecting left non-dominant side: Secondary | ICD-10-CM | POA: Diagnosis not present

## 2023-06-08 DIAGNOSIS — G4700 Insomnia, unspecified: Secondary | ICD-10-CM | POA: Diagnosis not present

## 2023-06-09 DIAGNOSIS — I10 Essential (primary) hypertension: Secondary | ICD-10-CM | POA: Diagnosis not present

## 2023-06-09 DIAGNOSIS — S62102D Fracture of unspecified carpal bone, left wrist, subsequent encounter for fracture with routine healing: Secondary | ICD-10-CM | POA: Diagnosis not present

## 2023-06-09 DIAGNOSIS — E039 Hypothyroidism, unspecified: Secondary | ICD-10-CM | POA: Diagnosis not present

## 2023-06-09 DIAGNOSIS — Z905 Acquired absence of kidney: Secondary | ICD-10-CM | POA: Diagnosis not present

## 2023-06-09 DIAGNOSIS — G4700 Insomnia, unspecified: Secondary | ICD-10-CM | POA: Diagnosis not present

## 2023-06-09 DIAGNOSIS — M1712 Unilateral primary osteoarthritis, left knee: Secondary | ICD-10-CM | POA: Diagnosis not present

## 2023-06-09 DIAGNOSIS — Z7982 Long term (current) use of aspirin: Secondary | ICD-10-CM | POA: Diagnosis not present

## 2023-06-09 DIAGNOSIS — E782 Mixed hyperlipidemia: Secondary | ICD-10-CM | POA: Diagnosis not present

## 2023-06-09 DIAGNOSIS — R296 Repeated falls: Secondary | ICD-10-CM | POA: Diagnosis not present

## 2023-06-09 DIAGNOSIS — Z9181 History of falling: Secondary | ICD-10-CM | POA: Diagnosis not present

## 2023-06-09 DIAGNOSIS — I69354 Hemiplegia and hemiparesis following cerebral infarction affecting left non-dominant side: Secondary | ICD-10-CM | POA: Diagnosis not present

## 2023-06-13 DIAGNOSIS — I10 Essential (primary) hypertension: Secondary | ICD-10-CM | POA: Diagnosis not present

## 2023-06-13 DIAGNOSIS — E782 Mixed hyperlipidemia: Secondary | ICD-10-CM | POA: Diagnosis not present

## 2023-06-13 DIAGNOSIS — Z905 Acquired absence of kidney: Secondary | ICD-10-CM | POA: Diagnosis not present

## 2023-06-13 DIAGNOSIS — E039 Hypothyroidism, unspecified: Secondary | ICD-10-CM | POA: Diagnosis not present

## 2023-06-13 DIAGNOSIS — R296 Repeated falls: Secondary | ICD-10-CM | POA: Diagnosis not present

## 2023-06-13 DIAGNOSIS — Z7982 Long term (current) use of aspirin: Secondary | ICD-10-CM | POA: Diagnosis not present

## 2023-06-13 DIAGNOSIS — S62102D Fracture of unspecified carpal bone, left wrist, subsequent encounter for fracture with routine healing: Secondary | ICD-10-CM | POA: Diagnosis not present

## 2023-06-13 DIAGNOSIS — G4700 Insomnia, unspecified: Secondary | ICD-10-CM | POA: Diagnosis not present

## 2023-06-13 DIAGNOSIS — Z9181 History of falling: Secondary | ICD-10-CM | POA: Diagnosis not present

## 2023-06-13 DIAGNOSIS — I69354 Hemiplegia and hemiparesis following cerebral infarction affecting left non-dominant side: Secondary | ICD-10-CM | POA: Diagnosis not present

## 2023-06-13 DIAGNOSIS — M1712 Unilateral primary osteoarthritis, left knee: Secondary | ICD-10-CM | POA: Diagnosis not present

## 2023-06-16 DIAGNOSIS — Z7982 Long term (current) use of aspirin: Secondary | ICD-10-CM | POA: Diagnosis not present

## 2023-06-16 DIAGNOSIS — E039 Hypothyroidism, unspecified: Secondary | ICD-10-CM | POA: Diagnosis not present

## 2023-06-16 DIAGNOSIS — Z9181 History of falling: Secondary | ICD-10-CM | POA: Diagnosis not present

## 2023-06-16 DIAGNOSIS — I10 Essential (primary) hypertension: Secondary | ICD-10-CM | POA: Diagnosis not present

## 2023-06-16 DIAGNOSIS — M1712 Unilateral primary osteoarthritis, left knee: Secondary | ICD-10-CM | POA: Diagnosis not present

## 2023-06-16 DIAGNOSIS — I69354 Hemiplegia and hemiparesis following cerebral infarction affecting left non-dominant side: Secondary | ICD-10-CM | POA: Diagnosis not present

## 2023-06-16 DIAGNOSIS — Z905 Acquired absence of kidney: Secondary | ICD-10-CM | POA: Diagnosis not present

## 2023-06-16 DIAGNOSIS — E782 Mixed hyperlipidemia: Secondary | ICD-10-CM | POA: Diagnosis not present

## 2023-06-16 DIAGNOSIS — S62102D Fracture of unspecified carpal bone, left wrist, subsequent encounter for fracture with routine healing: Secondary | ICD-10-CM | POA: Diagnosis not present

## 2023-06-16 DIAGNOSIS — R296 Repeated falls: Secondary | ICD-10-CM | POA: Diagnosis not present

## 2023-06-16 DIAGNOSIS — G4700 Insomnia, unspecified: Secondary | ICD-10-CM | POA: Diagnosis not present

## 2023-06-20 DIAGNOSIS — E039 Hypothyroidism, unspecified: Secondary | ICD-10-CM | POA: Diagnosis not present

## 2023-06-20 DIAGNOSIS — Z9181 History of falling: Secondary | ICD-10-CM | POA: Diagnosis not present

## 2023-06-20 DIAGNOSIS — I10 Essential (primary) hypertension: Secondary | ICD-10-CM | POA: Diagnosis not present

## 2023-06-20 DIAGNOSIS — G4700 Insomnia, unspecified: Secondary | ICD-10-CM | POA: Diagnosis not present

## 2023-06-20 DIAGNOSIS — E782 Mixed hyperlipidemia: Secondary | ICD-10-CM | POA: Diagnosis not present

## 2023-06-20 DIAGNOSIS — R296 Repeated falls: Secondary | ICD-10-CM | POA: Diagnosis not present

## 2023-06-20 DIAGNOSIS — M1712 Unilateral primary osteoarthritis, left knee: Secondary | ICD-10-CM | POA: Diagnosis not present

## 2023-06-20 DIAGNOSIS — Z905 Acquired absence of kidney: Secondary | ICD-10-CM | POA: Diagnosis not present

## 2023-06-20 DIAGNOSIS — Z7982 Long term (current) use of aspirin: Secondary | ICD-10-CM | POA: Diagnosis not present

## 2023-06-20 DIAGNOSIS — S62102D Fracture of unspecified carpal bone, left wrist, subsequent encounter for fracture with routine healing: Secondary | ICD-10-CM | POA: Diagnosis not present

## 2023-06-20 DIAGNOSIS — I69354 Hemiplegia and hemiparesis following cerebral infarction affecting left non-dominant side: Secondary | ICD-10-CM | POA: Diagnosis not present

## 2023-06-23 DIAGNOSIS — N1832 Chronic kidney disease, stage 3b: Secondary | ICD-10-CM | POA: Diagnosis not present

## 2023-06-23 DIAGNOSIS — R269 Unspecified abnormalities of gait and mobility: Secondary | ICD-10-CM | POA: Diagnosis not present

## 2023-06-23 DIAGNOSIS — E039 Hypothyroidism, unspecified: Secondary | ICD-10-CM | POA: Diagnosis not present

## 2023-06-27 DIAGNOSIS — G4700 Insomnia, unspecified: Secondary | ICD-10-CM | POA: Diagnosis not present

## 2023-06-27 DIAGNOSIS — S62102D Fracture of unspecified carpal bone, left wrist, subsequent encounter for fracture with routine healing: Secondary | ICD-10-CM | POA: Diagnosis not present

## 2023-06-27 DIAGNOSIS — M1712 Unilateral primary osteoarthritis, left knee: Secondary | ICD-10-CM | POA: Diagnosis not present

## 2023-06-27 DIAGNOSIS — E039 Hypothyroidism, unspecified: Secondary | ICD-10-CM | POA: Diagnosis not present

## 2023-06-27 DIAGNOSIS — Z7982 Long term (current) use of aspirin: Secondary | ICD-10-CM | POA: Diagnosis not present

## 2023-06-27 DIAGNOSIS — I69354 Hemiplegia and hemiparesis following cerebral infarction affecting left non-dominant side: Secondary | ICD-10-CM | POA: Diagnosis not present

## 2023-06-27 DIAGNOSIS — Z905 Acquired absence of kidney: Secondary | ICD-10-CM | POA: Diagnosis not present

## 2023-06-27 DIAGNOSIS — Z9181 History of falling: Secondary | ICD-10-CM | POA: Diagnosis not present

## 2023-06-27 DIAGNOSIS — N1832 Chronic kidney disease, stage 3b: Secondary | ICD-10-CM | POA: Diagnosis not present

## 2023-06-27 DIAGNOSIS — I10 Essential (primary) hypertension: Secondary | ICD-10-CM | POA: Diagnosis not present

## 2023-06-27 DIAGNOSIS — R296 Repeated falls: Secondary | ICD-10-CM | POA: Diagnosis not present

## 2023-06-27 DIAGNOSIS — E782 Mixed hyperlipidemia: Secondary | ICD-10-CM | POA: Diagnosis not present

## 2023-06-30 DIAGNOSIS — S40011A Contusion of right shoulder, initial encounter: Secondary | ICD-10-CM | POA: Diagnosis not present

## 2023-06-30 DIAGNOSIS — R269 Unspecified abnormalities of gait and mobility: Secondary | ICD-10-CM | POA: Diagnosis not present

## 2023-06-30 DIAGNOSIS — W19XXXA Unspecified fall, initial encounter: Secondary | ICD-10-CM | POA: Diagnosis not present

## 2023-06-30 DIAGNOSIS — S8010XA Contusion of unspecified lower leg, initial encounter: Secondary | ICD-10-CM | POA: Diagnosis not present

## 2023-07-06 DIAGNOSIS — E039 Hypothyroidism, unspecified: Secondary | ICD-10-CM | POA: Diagnosis not present

## 2023-07-07 DIAGNOSIS — S62102D Fracture of unspecified carpal bone, left wrist, subsequent encounter for fracture with routine healing: Secondary | ICD-10-CM | POA: Diagnosis not present

## 2023-07-07 DIAGNOSIS — E039 Hypothyroidism, unspecified: Secondary | ICD-10-CM | POA: Diagnosis not present

## 2023-07-07 DIAGNOSIS — I69354 Hemiplegia and hemiparesis following cerebral infarction affecting left non-dominant side: Secondary | ICD-10-CM | POA: Diagnosis not present

## 2023-07-07 DIAGNOSIS — E782 Mixed hyperlipidemia: Secondary | ICD-10-CM | POA: Diagnosis not present

## 2023-07-07 DIAGNOSIS — G4700 Insomnia, unspecified: Secondary | ICD-10-CM | POA: Diagnosis not present

## 2023-07-07 DIAGNOSIS — R296 Repeated falls: Secondary | ICD-10-CM | POA: Diagnosis not present

## 2023-07-07 DIAGNOSIS — M1712 Unilateral primary osteoarthritis, left knee: Secondary | ICD-10-CM | POA: Diagnosis not present

## 2023-07-07 DIAGNOSIS — Z9181 History of falling: Secondary | ICD-10-CM | POA: Diagnosis not present

## 2023-07-07 DIAGNOSIS — Z905 Acquired absence of kidney: Secondary | ICD-10-CM | POA: Diagnosis not present

## 2023-07-07 DIAGNOSIS — Z7982 Long term (current) use of aspirin: Secondary | ICD-10-CM | POA: Diagnosis not present

## 2023-07-07 DIAGNOSIS — I10 Essential (primary) hypertension: Secondary | ICD-10-CM | POA: Diagnosis not present

## 2023-07-11 DIAGNOSIS — E039 Hypothyroidism, unspecified: Secondary | ICD-10-CM | POA: Diagnosis not present

## 2023-07-12 DIAGNOSIS — Z9181 History of falling: Secondary | ICD-10-CM | POA: Diagnosis not present

## 2023-07-12 DIAGNOSIS — G8929 Other chronic pain: Secondary | ICD-10-CM | POA: Diagnosis not present

## 2023-07-12 DIAGNOSIS — Z7982 Long term (current) use of aspirin: Secondary | ICD-10-CM | POA: Diagnosis not present

## 2023-07-12 DIAGNOSIS — N183 Chronic kidney disease, stage 3 unspecified: Secondary | ICD-10-CM | POA: Diagnosis not present

## 2023-07-12 DIAGNOSIS — Z515 Encounter for palliative care: Secondary | ICD-10-CM | POA: Diagnosis not present

## 2023-07-12 DIAGNOSIS — I6931 Attention and concentration deficit following cerebral infarction: Secondary | ICD-10-CM | POA: Diagnosis not present

## 2023-07-12 DIAGNOSIS — E039 Hypothyroidism, unspecified: Secondary | ICD-10-CM | POA: Diagnosis not present

## 2023-07-12 DIAGNOSIS — E46 Unspecified protein-calorie malnutrition: Secondary | ICD-10-CM | POA: Diagnosis not present

## 2023-07-12 DIAGNOSIS — I10 Essential (primary) hypertension: Secondary | ICD-10-CM | POA: Diagnosis not present

## 2023-07-12 DIAGNOSIS — I69320 Aphasia following cerebral infarction: Secondary | ICD-10-CM | POA: Diagnosis not present

## 2023-07-15 DIAGNOSIS — N183 Chronic kidney disease, stage 3 unspecified: Secondary | ICD-10-CM | POA: Diagnosis not present

## 2023-07-15 DIAGNOSIS — S52502A Unspecified fracture of the lower end of left radius, initial encounter for closed fracture: Secondary | ICD-10-CM | POA: Diagnosis not present

## 2023-07-15 DIAGNOSIS — G8929 Other chronic pain: Secondary | ICD-10-CM | POA: Diagnosis not present

## 2023-10-02 ENCOUNTER — Emergency Department
Admission: EM | Admit: 2023-10-02 | Discharge: 2023-10-02 | Disposition: A | Discharge status: Home / Self Care | Attending: Emergency Medicine | Admitting: Emergency Medicine

## 2023-10-02 ENCOUNTER — Emergency Department

## 2023-10-02 ENCOUNTER — Other Ambulatory Visit: Payer: Self-pay

## 2023-10-02 DIAGNOSIS — W19XXXA Unspecified fall, initial encounter: Secondary | ICD-10-CM

## 2023-10-02 DIAGNOSIS — F0150 Vascular dementia without behavioral disturbance: Secondary | ICD-10-CM | POA: Insufficient documentation

## 2023-10-02 DIAGNOSIS — S0083XA Contusion of other part of head, initial encounter: Secondary | ICD-10-CM | POA: Diagnosis not present

## 2023-10-02 DIAGNOSIS — W06XXXA Fall from bed, initial encounter: Secondary | ICD-10-CM | POA: Diagnosis not present

## 2023-10-02 DIAGNOSIS — N183 Chronic kidney disease, stage 3 unspecified: Secondary | ICD-10-CM | POA: Diagnosis not present

## 2023-10-02 DIAGNOSIS — S0012XA Contusion of left eyelid and periocular area, initial encounter: Secondary | ICD-10-CM | POA: Insufficient documentation

## 2023-10-02 MED ORDER — ACETAMINOPHEN 500 MG PO TABS
1000.0000 mg | ORAL_TABLET | Freq: Once | ORAL | Status: AC
  Administered 2023-10-02: 1000 mg via ORAL
  Filled 2023-10-02: qty 2

## 2023-10-02 NOTE — Discharge Instructions (Signed)
Use Tylenol for pain and fevers.  Up to 1000 mg per dose, up to 4 times per day.  Do not take more than 4000 mg of Tylenol/acetaminophen within 24 hours..  

## 2023-10-02 NOTE — ED Triage Notes (Signed)
 Pt to ed from home via POV from [facility]. Pt had an unwitnessed fall and struck her face around 3pm. Unknown LOC. Pt is not on any blood thinners. Pt has large hematoma to left eye lid. Pt is alert and oriented to her baseline per her son.

## 2023-10-02 NOTE — ED Provider Notes (Signed)
 [facility] Provider Note    Event Date/Time   First MD Initiated Contact with Patient 10/02/23 1724     (approximate)   History   Fall   HPI  Rachel Lopez is a 83 y.o. female who presents to the ED for evaluation of Fall   Review a routine PCP visit from February.  History of vascular dementia, CKD 3, frequent falls  Patient presents from local SNF for evaluation of accidental fall out of bed with left periorbital bruising.  No complaints.  Here with her son who does not have any additional concerns beyond a fall today.  No preceding illnesses that he is aware of.  Physical Exam   Triage Vital Signs: ED Triage Vitals  Encounter Vitals Group     BP 10/02/23 1724 (!) 143/82     Girls Systolic BP Percentile --      Girls Diastolic BP Percentile --      Boys Systolic BP Percentile --      Boys Diastolic BP Percentile --      Pulse Rate 10/02/23 1724 75     Resp 10/02/23 1724 16     Temp 10/02/23 1724 97.6 F (36.4 C)     Temp Source 10/02/23 1724 Oral     SpO2 10/02/23 1724 99 %     Weight --      Height 10/02/23 1725 5' 4 (1.626 m)     Head Circumference --      Peak Flow --      Pain Score --      Pain Loc --      Pain Education --      Exclude from Growth Chart --     Most recent vital signs: Vitals:   10/02/23 2030 10/02/23 2130  BP: (!) 135/47 138/73  Pulse: 67 62  Resp:  18  Temp:    SpO2: 100% 100%    General: Awake, no distress.  CV:  Good peripheral perfusion.  Resp:  Normal effort.  Abd:  No distention.  MSK:  No deformity noted.  Patient of all 4 extremities without evidence of deformity, tenderness or trauma. In palpating her back, she has an area of mild tenderness to left-sided thoracic paraspinal back without overlying skin changes or signs of trauma.  No midline spinal tenderness throughout Neuro:  No focal deficits appreciated. Other:  Left-sided lateral periorbital bruising without proptosis, EOM entrapment  or bony step-offs.  No open globe.  Pupils are midrange and PERRL.   ED Results / Procedures / Treatments   Labs (all labs ordered are listed, but only abnormal results are displayed) Labs Reviewed - No data to display  EKG   RADIOLOGY CT head interpreted by me without evidence of acute intracranial pathology CT cervical spine interpreted by me without evidence of fracture or dislocation CXR interpreted by me without evidence of acute cardiopulmonary pathology. Plain film thoracic spine interpreted by me without fracture or dislocation CT maxillofacial interpreted by me without evidence of fracture or dislocation  Official radiology report(s): DG Thoracic Spine 2 View Result Date: 10/02/2023 CLINICAL DATA:  Unwitnessed fall. EXAM: THORACIC SPINE 2 VIEWS COMPARISON:  None Available. FINDINGS: There is no evidence of acute thoracic spine fracture. Alignment is normal. Marked severity multilevel degenerative changes seen throughout the thoracic spine. IMPRESSION: Marked severity multilevel degenerative changes throughout the thoracic spine. Electronically Signed   By: Virgle Grime M.D.   On: 10/02/2023 19:31   DG Chest 2  View Result Date: 10/02/2023 CLINICAL DATA:  Unwitnessed fall. EXAM: CHEST - 2 VIEW COMPARISON:  April 16, 2015 FINDINGS: The heart size and mediastinal contours are within normal limits. There is marked severity calcification of the aortic arch. Both lungs are clear. Multilevel degenerative changes seen throughout the thoracic spine. IMPRESSION: No active cardiopulmonary disease. Electronically Signed   By: Virgle Grime M.D.   On: 10/02/2023 19:27   CT Head Wo Contrast Result Date: 10/02/2023 CLINICAL DATA:  Head trauma, minor (Age >= 65y); fall, periorbital swelling and bruising; Neck trauma (Age >= 65y) Pt to ed from home via POV from [facility]. Pt had an unwitnessed fall and struck her face around 3pm. Unknown LOC. Pt is not on any blood thinners.  Pt has large hematoma to left eye lid. Pt is alert and oriented to her baseline per her son. EXAM: CT HEAD WITHOUT CONTRAST CT MAXILLOFACIAL WITHOUT CONTRAST CT CERVICAL SPINE WITHOUT CONTRAST TECHNIQUE: Multidetector CT imaging of the head, cervical spine, and maxillofacial structures were performed using the standard protocol without intravenous contrast. Multiplanar CT image reconstructions of the cervical spine and maxillofacial structures were also generated. RADIATION DOSE REDUCTION: This exam was performed according to the departmental dose-optimization program which includes automated exposure control, adjustment of the mA and/or kV according to patient size and/or use of iterative reconstruction technique. COMPARISON:  None Available. FINDINGS: CT HEAD FINDINGS Brain: Cerebral ventricle sizes are concordant with the degree of cerebral volume loss. Patchy and confluent areas of decreased attenuation are noted throughout the deep and periventricular white matter of the cerebral hemispheres bilaterally, compatible with chronic microvascular ischemic disease. Right insular lacunar infarction. No evidence of large-territorial acute infarction. No parenchymal hemorrhage. No mass lesion. No extra-axial collection. No mass effect or midline shift. No hydrocephalus. Basilar cisterns are patent. Vascular: No hyperdense vessel. Atherosclerotic calcifications are present within the cavernous internal carotid arteries. Skull: No acute fracture or focal lesion. Other: None. CT MAXILLOFACIAL FINDINGS Osseous: No fracture or mandibular dislocation. No destructive process. Sinuses/Orbits: Paranasal sinuses and mastoid air cells are clear. The orbits are unremarkable. Soft tissues: Left maxillary 12 mm hematoma formation. CT CERVICAL SPINE FINDINGS Alignment: Grade 1 anterolisthesis of C3 on C4. Grade 1 anterolisthesis of C6 on C7. Grade 1 anterolisthesis of C7 on T1. Skull base and vertebrae: Multilevel moderate to severe  degenerative changes of the spine. Associated severe osseous neural foraminal stenosis at the bilateral C3-C4 level. At least moderate osseous neural foraminal stenosis at the C4-C5 level. No severe osseous central canal stenosis. No acute fracture. No aggressive appearing focal osseous lesion or focal pathologic process. Soft tissues and spinal canal: No prevertebral fluid or swelling. No visible canal hematoma. Upper chest: Unremarkable. Other: Atherosclerotic plaque of the carotid arteries within the neck. IMPRESSION: 1.  No acute intracranial abnormality. 2.  No acute displaced facial fracture. 3. No acute displaced fracture or traumatic listhesis of the cervical spine. Electronically Signed   By: Morgane  Naveau M.D.   On: 10/02/2023 18:12   CT Cervical Spine Wo Contrast Result Date: 10/02/2023 CLINICAL DATA:  Head trauma, minor (Age >= 65y); fall, periorbital swelling and bruising; Neck trauma (Age >= 65y) Pt to ed from home via POV from [facility]. Pt had an unwitnessed fall and struck her face around 3pm. Unknown LOC. Pt is not on any blood thinners. Pt has large hematoma to left eye lid. Pt is alert and oriented to her baseline per her son. EXAM: CT HEAD WITHOUT CONTRAST CT  MAXILLOFACIAL WITHOUT CONTRAST CT CERVICAL SPINE WITHOUT CONTRAST TECHNIQUE: Multidetector CT imaging of the head, cervical spine, and maxillofacial structures were performed using the standard protocol without intravenous contrast. Multiplanar CT image reconstructions of the cervical spine and maxillofacial structures were also generated. RADIATION DOSE REDUCTION: This exam was performed according to the departmental dose-optimization program which includes automated exposure control, adjustment of the mA and/or kV according to patient size and/or use of iterative reconstruction technique. COMPARISON:  None Available. FINDINGS: CT HEAD FINDINGS Brain: Cerebral ventricle sizes are concordant with the degree of cerebral  volume loss. Patchy and confluent areas of decreased attenuation are noted throughout the deep and periventricular white matter of the cerebral hemispheres bilaterally, compatible with chronic microvascular ischemic disease. Right insular lacunar infarction. No evidence of large-territorial acute infarction. No parenchymal hemorrhage. No mass lesion. No extra-axial collection. No mass effect or midline shift. No hydrocephalus. Basilar cisterns are patent. Vascular: No hyperdense vessel. Atherosclerotic calcifications are present within the cavernous internal carotid arteries. Skull: No acute fracture or focal lesion. Other: None. CT MAXILLOFACIAL FINDINGS Osseous: No fracture or mandibular dislocation. No destructive process. Sinuses/Orbits: Paranasal sinuses and mastoid air cells are clear. The orbits are unremarkable. Soft tissues: Left maxillary 12 mm hematoma formation. CT CERVICAL SPINE FINDINGS Alignment: Grade 1 anterolisthesis of C3 on C4. Grade 1 anterolisthesis of C6 on C7. Grade 1 anterolisthesis of C7 on T1. Skull base and vertebrae: Multilevel moderate to severe degenerative changes of the spine. Associated severe osseous neural foraminal stenosis at the bilateral C3-C4 level. At least moderate osseous neural foraminal stenosis at the C4-C5 level. No severe osseous central canal stenosis. No acute fracture. No aggressive appearing focal osseous lesion or focal pathologic process. Soft tissues and spinal canal: No prevertebral fluid or swelling. No visible canal hematoma. Upper chest: Unremarkable. Other: Atherosclerotic plaque of the carotid arteries within the neck. IMPRESSION: 1.  No acute intracranial abnormality. 2.  No acute displaced facial fracture. 3. No acute displaced fracture or traumatic listhesis of the cervical spine. Electronically Signed   By: Morgane  Naveau M.D.   On: 10/02/2023 18:12   CT Maxillofacial Wo Contrast Result Date: 10/02/2023 CLINICAL DATA:  Head trauma, minor (Age >=  65y); fall, periorbital swelling and bruising; Neck trauma (Age >= 65y) Pt to ed from home via POV from [facility]. Pt had an unwitnessed fall and struck her face around 3pm. Unknown LOC. Pt is not on any blood thinners. Pt has large hematoma to left eye lid. Pt is alert and oriented to her baseline per her son. EXAM: CT HEAD WITHOUT CONTRAST CT MAXILLOFACIAL WITHOUT CONTRAST CT CERVICAL SPINE WITHOUT CONTRAST TECHNIQUE: Multidetector CT imaging of the head, cervical spine, and maxillofacial structures were performed using the standard protocol without intravenous contrast. Multiplanar CT image reconstructions of the cervical spine and maxillofacial structures were also generated. RADIATION DOSE REDUCTION: This exam was performed according to the departmental dose-optimization program which includes automated exposure control, adjustment of the mA and/or kV according to patient size and/or use of iterative reconstruction technique. COMPARISON:  None Available. FINDINGS: CT HEAD FINDINGS Brain: Cerebral ventricle sizes are concordant with the degree of cerebral volume loss. Patchy and confluent areas of decreased attenuation are noted throughout the deep and periventricular white matter of the cerebral hemispheres bilaterally, compatible with chronic microvascular ischemic disease. Right insular lacunar infarction. No evidence of large-territorial acute infarction. No parenchymal hemorrhage. No mass lesion. No extra-axial collection. No mass effect or midline shift. No hydrocephalus. Basilar cisterns are  patent. Vascular: No hyperdense vessel. Atherosclerotic calcifications are present within the cavernous internal carotid arteries. Skull: No acute fracture or focal lesion. Other: None. CT MAXILLOFACIAL FINDINGS Osseous: No fracture or mandibular dislocation. No destructive process. Sinuses/Orbits: Paranasal sinuses and mastoid air cells are clear. The orbits are unremarkable. Soft tissues: Left maxillary  12 mm hematoma formation. CT CERVICAL SPINE FINDINGS Alignment: Grade 1 anterolisthesis of C3 on C4. Grade 1 anterolisthesis of C6 on C7. Grade 1 anterolisthesis of C7 on T1. Skull base and vertebrae: Multilevel moderate to severe degenerative changes of the spine. Associated severe osseous neural foraminal stenosis at the bilateral C3-C4 level. At least moderate osseous neural foraminal stenosis at the C4-C5 level. No severe osseous central canal stenosis. No acute fracture. No aggressive appearing focal osseous lesion or focal pathologic process. Soft tissues and spinal canal: No prevertebral fluid or swelling. No visible canal hematoma. Upper chest: Unremarkable. Other: Atherosclerotic plaque of the carotid arteries within the neck. IMPRESSION: 1.  No acute intracranial abnormality. 2.  No acute displaced facial fracture. 3. No acute displaced fracture or traumatic listhesis of the cervical spine. Electronically Signed   By: Morgane  Naveau M.D.   On: 10/02/2023 18:12    PROCEDURES and INTERVENTIONS:  Procedures  Medications  acetaminophen  (TYLENOL ) tablet 1,000 mg (1,000 mg Oral Given 10/02/23 1829)     IMPRESSION / MDM / ASSESSMENT AND PLAN / ED COURSE  I reviewed the triage vital signs and the nursing notes.  Differential diagnosis includes, but is not limited to, orbital fracture, retrobulbar hematoma, ICH, thoracic or cervical spinal fracture, syncope, seizure  {Patient presents with symptoms of an acute illness or injury that is potentially life-threatening.  Admitted patient presents after unwitnessed fall with bruising on the face but benign imaging and suitable for outpatient management.      FINAL CLINICAL IMPRESSION(S) / ED DIAGNOSES   Final diagnoses:  Fall, initial encounter  Contusion of face, initial encounter  Periorbital ecchymosis of left eye, initial encounter     Rx / DC Orders   ED Discharge Orders     None        Note:  This document was prepared  using Dragon voice recognition software and may include unintentional dictation errors.   Arline Bennett, MD 10/02/23 [phone]

## 2023-10-05 ENCOUNTER — Telehealth: Payer: Self-pay

## 2023-10-05 NOTE — Transitions of Care (Post Inpatient/ED Visit) (Unsigned)
   10/05/2023  Name: Rachel Lopez MRN: [id number] DOB: [birth year]  Today's TOC FU Call Status: Today's TOC FU Call Status:: Unsuccessful Call (1st Attempt) Unsuccessful Call (1st Attempt) Date: 10/05/23  Attempted to reach the patient regarding the most recent Inpatient/ED visit.  Follow Up Plan: Additional outreach attempts will be made to reach the patient to complete the Transitions of Care (Post Inpatient/ED visit) call.   [name] [place]  Primary Care & Sports Medicine at [facility], [facility] [address]  [place] [address] Office [phone]  Fax: [phone]

## 2023-10-06 ENCOUNTER — Telehealth: Payer: Self-pay

## 2023-10-06 NOTE — Telephone Encounter (Signed)
 [name] for patient to call back.  JM   Copied from CRM [id number]. Topic: General - Call Back - No Documentation >> Oct 06, 2023 11:53 AM [name] wrote: Call back for Rachel Lopez  Call back [phone] Jerryl Morin says patient here and to please call her back

## 2023-10-06 NOTE — Telephone Encounter (Signed)
 [name] for patient to call back. JM   Copied from CRM [id number]. Topic: General - Call Back - No Documentation >> Oct 06, 2023 11:51 AM [name] wrote: Call back for Rachel Lopez

## 2023-10-06 NOTE — Transitions of Care (Post Inpatient/ED Visit) (Unsigned)
   10/06/2023  Name: Rachel Lopez MRN: [id number] DOB: [birth year]  Today's TOC FU Call Status: Today's TOC FU Call Status:: Unsuccessful Call (2nd Attempt) Unsuccessful Call (1st Attempt) Date: 10/05/23 Unsuccessful Call (2nd Attempt) Date: 10/06/23  Attempted to reach the patient regarding the most recent Inpatient/ED visit.  Follow Up Plan: Additional outreach attempts will be made to reach the patient to complete the Transitions of Care (Post Inpatient/ED visit) call.   Kailan Carmen [place]  Primary Care & Sports Medicine at [facility], [facility] [address]  [place] [address] Office [phone]  Fax: [phone]

## 2023-10-07 ENCOUNTER — Telehealth: Payer: Self-pay

## 2023-10-07 NOTE — Telephone Encounter (Signed)
 Copied from CRM [id number]. Topic: General - Registration Update >> Oct 06, 2023  3:42 PM Ivette P wrote: Patient/patient representative is calling to make an update to registration.    PT Daughter in [place],  called in to notify that Chassidy called her husband Rozelle Corning while at work and cannot Dealer.   Chassidy was calling to follow up on a most recent hospital visit that the pt had and Rozelle Corning and Jerryl Morin want to notify that Pt is not being seen by Dr. Gala Jubilee, [place], MD no longer.   Pt went to [facility] starting 05/09/2023 and no longer has her primary care with [facility].   Jerryl Morin would like for Sheron Dixons, MD to be removed as primary doctor for pt on myChart.   Can call Jerryl Morin at  [phone]  Can call Rozelle Corning, but will not answer while he is at work.

## 2023-10-11 NOTE — Transitions of Care (Post Inpatient/ED Visit) (Signed)
   10/11/2023  Name: IANA BUZAN MRN: [id number] DOB: 1940-07-03  Today's TOC FU Call Status: Today's TOC FU Call Status:: Unsuccessful Call (3rd Attempt) Unsuccessful Call (1st Attempt) Date: 10/05/23 Unsuccessful Call (2nd Attempt) Date: 10/06/23 Unsuccessful Call (3rd Attempt) Date: 10/11/23  Attempted to reach the patient regarding the most recent Inpatient/ED visit.  Follow Up Plan: No further outreach attempts will be made at this time. We have been unable to contact the patient.  Lenin Kuhnle [place]  Primary Care & Sports Medicine at [facility], [facility] [address]  [place] [address] Office [phone]  Fax: [phone]

## 2024-05-05 ENCOUNTER — Emergency Department: Admission: EM | Admit: 2024-05-05 | Discharge: 2024-05-06 | Disposition: A

## 2024-05-05 ENCOUNTER — Other Ambulatory Visit: Payer: Self-pay

## 2024-05-05 DIAGNOSIS — Y92129 Unspecified place in nursing home as the place of occurrence of the external cause: Secondary | ICD-10-CM | POA: Diagnosis not present

## 2024-05-05 DIAGNOSIS — S0181XA Laceration without foreign body of other part of head, initial encounter: Secondary | ICD-10-CM | POA: Insufficient documentation

## 2024-05-05 DIAGNOSIS — W19XXXA Unspecified fall, initial encounter: Secondary | ICD-10-CM | POA: Diagnosis not present

## 2024-05-05 DIAGNOSIS — S0990XA Unspecified injury of head, initial encounter: Secondary | ICD-10-CM | POA: Diagnosis present

## 2024-05-05 DIAGNOSIS — Z23 Encounter for immunization: Secondary | ICD-10-CM | POA: Insufficient documentation

## 2024-05-05 NOTE — ED Triage Notes (Signed)
 Pt BIB [facility] from [facility]. Arrives after an unwitnessed fall. Unknown downtime. Found down by staff. Presents with laceration and swelling above right eye. EMS reports baseline mentation status per SNF - HX dementia. No blood thinner.

## 2024-05-06 ENCOUNTER — Emergency Department

## 2024-05-06 MED ORDER — LIDOCAINE-EPINEPHRINE 2 %-1:100000 IJ SOLN
20.0000 mL | Freq: Once | INTRAMUSCULAR | Status: AC
  Administered 2024-05-06: 20 mL via INTRADERMAL
  Filled 2024-05-06: qty 1

## 2024-05-06 MED ORDER — TETANUS-DIPHTH-ACELL PERTUSSIS 5-2-15.5 LF-MCG/0.5 IM SUSP
0.5000 mL | Freq: Once | INTRAMUSCULAR | Status: AC
  Administered 2024-05-06: 0.5 mL via INTRAMUSCULAR
  Filled 2024-05-06: qty 0.5

## 2024-05-06 NOTE — Discharge Instructions (Signed)
 You were seen today due to concern of fall.  At this time I have placed 4 sutures that should fall off on their own over the next week.  Please be sure to get plenty of rest and drink plenty of water.  If you start having any confusion, severe headache, pain along the area of your sutures, or any other symptoms you find concerning please return to the emergency department immediately for further medical management.

## 2024-05-06 NOTE — ED Notes (Signed)
 Pt's wound cleaned and irrigated with 0.9% NS. 2.5 cm wound with separation and visible subcutaneous fat and a large clot.

## 2024-05-06 NOTE — ED Notes (Signed)
"  Pt cleaned and brief changed.   "

## 2024-05-06 NOTE — ED Provider Notes (Signed)
 "  [facility] Provider Note    Event Date/Time   First MD Initiated Contact with Patient 05/05/24 2345     (approximate)   History   Fall   HPI  Rachel Lopez is a 84 y.o. female presenting with concern of an unwitnessed fall.  Baseline wheelchair bound, found on the ground with laceration at her memory care unit.  Patient does not recall the fall, believed to have been on the ground only for a brief amount of time.  Patient without any complaints at this time, unclear tetanus history.  Family was at bedside informing that patient is behaving at about baseline.  Takes aspirin no other blood thinners.  Patient without any other complaints.     Physical Exam   Triage Vital Signs: ED Triage Vitals [05/05/24 2241]  Encounter Vitals Group     BP (!) 116/90     Girls Systolic BP Percentile      Girls Diastolic BP Percentile      Boys Systolic BP Percentile      Boys Diastolic BP Percentile      Pulse Rate 67     Resp 19     Temp 97.8 F (36.6 C)     Temp Source Oral     SpO2 100 %     Weight      Height      Head Circumference      Peak Flow      Pain Score      Pain Loc      Pain Education      Exclude from Growth Chart     Most recent vital signs: Vitals:   05/05/24 2241  BP: (!) 116/90  Pulse: 67  Resp: 19  Temp: 97.8 F (36.6 C)  SpO2: 100%     General: Awake, no distress.  HEENT: 2 cm laceration appreciated across the right forehead CV:  Good peripheral perfusion.  Resp:  Normal effort.  Abd:  No distention.  MSK:  No appreciated cervical thoracic lumbar spinal tenderness on exam, patient denying any malocclusions, extraocular motions are intact, extremities are nontender to palpation Other:     ED Results / Procedures / Treatments   Labs (all labs ordered are listed, but only abnormal results are displayed) Labs Reviewed - No data to display   EKG     RADIOLOGY   PROCEDURES:  Critical Care performed:  No  .Laceration Repair  Date/Time: 05/06/2024 1:34 AM  Performed by: Fernand Rossie HERO, MD Authorized by: Fernand Rossie HERO, MD   Consent:    Consent obtained:  Verbal   Consent given by:  Healthcare agent   Risks discussed:  Infection, retained foreign body, poor cosmetic result and poor wound healing   Alternatives discussed:  No treatment Universal protocol:    Patient identity confirmed:  Verbally with patient Anesthesia:    Anesthesia method:  Local infiltration   Local anesthetic:  Lidocaine  1% WITH epi Laceration details:    Location:  Face   Face location:  Forehead   Length (cm):  2   Depth (mm):  5 Pre-procedure details:    Preparation:  Imaging obtained to evaluate for foreign bodies and patient was prepped and draped in usual sterile fashion Exploration:    Hemostasis achieved with:  Epinephrine  and direct pressure   Wound exploration: wound explored through full range of motion     Wound extent: areolar tissue violated     Wound extent: fascia  not violated and no foreign body   Treatment:    Area cleansed with:  Saline   Amount of cleaning:  Standard   Debridement:  None   Undermining:  None Skin repair:    Repair method:  Sutures   Suture size:  6-0   Suture material:  Nylon   Suture technique:  Simple interrupted   Number of sutures:  4 Approximation:    Approximation:  Close Repair type:    Repair type:  Simple Post-procedure details:    Dressing:  Open (no dressing)   Procedure completion:  Tolerated    MEDICATIONS ORDERED IN ED: Medications  Tdap (ADACEL ) injection 0.5 mL (0.5 mLs Intramuscular Given 05/06/24 0126)  lidocaine -EPINEPHrine  (XYLOCAINE  W/EPI) 2 %-1:100000 (with pres) injection 20 mL (20 mLs Intradermal Given by Other 05/06/24 0127)     IMPRESSION / MDM / ASSESSMENT AND PLAN / ED COURSE  I reviewed the triage vital signs and the nursing notes.                               Patient's presentation is most consistent with acute  complicated illness / injury requiring diagnostic workup.  84 year old female presents with a unwitnessed fall.  Appears well she is not in any acute distress.  Laceration on the forehead which we will repair, obtaining CT imaging of the head and neck to rule out underlying injury.  Suspect reasonable for discharge back to memory care unit afterwards given that the patient is behaving at baseline.   Clinical Course as of 05/06/24 0135  [place] May 06, 2024  0135 Patient's imaging is reassuring, we will have her discharged home at this time, discussed return precautions, family verbalizes understanding and is agreeable with the plan. [SK]    Clinical Course User Index [SK] Fernand Rossie HERO, MD     FINAL CLINICAL IMPRESSION(S) / ED DIAGNOSES   Final diagnoses:  Fall, initial encounter  Facial laceration, initial encounter     Rx / DC Orders   ED Discharge Orders     None        Note:  This document was prepared using Dragon voice recognition software and may include unintentional dictation errors.   Fernand Rossie HERO, MD 05/06/24 [phone]  "

## 2024-05-07 ENCOUNTER — Encounter (INDEPENDENT_AMBULATORY_CARE_PROVIDER_SITE_OTHER): Payer: Self-pay
